# Patient Record
Sex: Male | Born: 1960 | Race: White | Hispanic: No | Smoking: Never smoker
Health system: Southern US, Community
[De-identification: ages and names within clinical notes are randomized; demographics above are authoritative.]

## PROBLEM LIST (undated history)

## (undated) ENCOUNTER — Ambulatory Visit (HOSPITAL_COMMUNITY): Admission: EM | Discharge status: Absent without leave | Payer: Self-pay

## (undated) DIAGNOSIS — M1990 Unspecified osteoarthritis, unspecified site: Secondary | ICD-10-CM

## (undated) DIAGNOSIS — I10 Essential (primary) hypertension: Secondary | ICD-10-CM

## (undated) HISTORY — PX: FOOT FASCIOTOMY: SHX1659

## (undated) HISTORY — PX: SHOULDER ARTHROTOMY: SUR111

## (undated) HISTORY — PX: HEMORRHOID SURGERY: SHX153

## (undated) HISTORY — PX: ELBOW SURGERY: SHX618

## (undated) HISTORY — PX: NECK EXPLORATION: SHX2077

## (undated) HISTORY — DX: Unspecified osteoarthritis, unspecified site: M19.90

---

## 2000-09-13 ENCOUNTER — Encounter: Payer: Self-pay | Admitting: Family Medicine

## 2000-09-13 ENCOUNTER — Ambulatory Visit (HOSPITAL_COMMUNITY): Admission: RE | Admit: 2000-09-13 | Discharge: 2000-09-13 | Payer: Self-pay | Admitting: Family Medicine

## 2000-11-04 ENCOUNTER — Ambulatory Visit (HOSPITAL_COMMUNITY): Admission: RE | Admit: 2000-11-04 | Discharge: 2000-11-04 | Payer: Self-pay | Admitting: General Surgery

## 2000-12-26 ENCOUNTER — Ambulatory Visit (HOSPITAL_COMMUNITY): Admission: RE | Admit: 2000-12-26 | Discharge: 2000-12-26 | Payer: Self-pay | Admitting: *Deleted

## 2000-12-26 ENCOUNTER — Encounter: Payer: Self-pay | Admitting: *Deleted

## 2001-11-13 ENCOUNTER — Encounter: Payer: Self-pay | Admitting: *Deleted

## 2001-11-13 ENCOUNTER — Ambulatory Visit (HOSPITAL_COMMUNITY): Admission: RE | Admit: 2001-11-13 | Discharge: 2001-11-13 | Payer: Self-pay | Admitting: *Deleted

## 2002-10-08 ENCOUNTER — Ambulatory Visit (HOSPITAL_COMMUNITY): Admission: RE | Admit: 2002-10-08 | Discharge: 2002-10-08 | Payer: Self-pay | Admitting: Family Medicine

## 2002-10-08 ENCOUNTER — Encounter: Payer: Self-pay | Admitting: Family Medicine

## 2002-10-11 ENCOUNTER — Encounter: Payer: Self-pay | Admitting: Orthopedic Surgery

## 2004-04-14 ENCOUNTER — Emergency Department (HOSPITAL_COMMUNITY): Admission: EM | Admit: 2004-04-14 | Discharge: 2004-04-14 | Payer: Self-pay | Admitting: Emergency Medicine

## 2004-05-06 ENCOUNTER — Ambulatory Visit: Payer: Self-pay | Admitting: Orthopedic Surgery

## 2004-07-06 ENCOUNTER — Ambulatory Visit: Payer: Self-pay | Admitting: Orthopedic Surgery

## 2004-08-03 ENCOUNTER — Ambulatory Visit: Payer: Self-pay | Admitting: Orthopedic Surgery

## 2004-09-07 ENCOUNTER — Ambulatory Visit: Payer: Self-pay | Admitting: Orthopedic Surgery

## 2004-10-14 ENCOUNTER — Ambulatory Visit: Payer: Self-pay | Admitting: Orthopedic Surgery

## 2004-10-22 ENCOUNTER — Ambulatory Visit: Payer: Self-pay | Admitting: Orthopedic Surgery

## 2004-12-07 ENCOUNTER — Ambulatory Visit: Payer: Self-pay | Admitting: Orthopedic Surgery

## 2004-12-15 ENCOUNTER — Ambulatory Visit (HOSPITAL_COMMUNITY): Admission: RE | Admit: 2004-12-15 | Discharge: 2004-12-15 | Payer: Self-pay | Admitting: Orthopedic Surgery

## 2004-12-15 ENCOUNTER — Ambulatory Visit: Payer: Self-pay | Admitting: Orthopedic Surgery

## 2004-12-17 ENCOUNTER — Ambulatory Visit: Payer: Self-pay | Admitting: Orthopedic Surgery

## 2004-12-24 ENCOUNTER — Ambulatory Visit: Payer: Self-pay | Admitting: Orthopedic Surgery

## 2005-01-21 ENCOUNTER — Ambulatory Visit: Payer: Self-pay | Admitting: Orthopedic Surgery

## 2005-03-17 ENCOUNTER — Ambulatory Visit: Payer: Self-pay | Admitting: Orthopedic Surgery

## 2005-06-16 ENCOUNTER — Ambulatory Visit (HOSPITAL_COMMUNITY): Admission: RE | Admit: 2005-06-16 | Discharge: 2005-06-16 | Payer: Self-pay | Admitting: Podiatry

## 2005-06-16 ENCOUNTER — Encounter (INDEPENDENT_AMBULATORY_CARE_PROVIDER_SITE_OTHER): Payer: Self-pay | Admitting: *Deleted

## 2005-07-02 ENCOUNTER — Encounter (INDEPENDENT_AMBULATORY_CARE_PROVIDER_SITE_OTHER): Payer: Self-pay | Admitting: Specialist

## 2005-07-02 ENCOUNTER — Ambulatory Visit (HOSPITAL_COMMUNITY): Admission: RE | Admit: 2005-07-02 | Discharge: 2005-07-02 | Payer: Self-pay | Admitting: General Surgery

## 2005-11-06 ENCOUNTER — Emergency Department (HOSPITAL_COMMUNITY): Admission: EM | Admit: 2005-11-06 | Discharge: 2005-11-06 | Payer: Self-pay | Admitting: Emergency Medicine

## 2005-11-17 ENCOUNTER — Ambulatory Visit (HOSPITAL_COMMUNITY): Admission: RE | Admit: 2005-11-17 | Discharge: 2005-11-17 | Payer: Self-pay | Admitting: Internal Medicine

## 2005-11-17 ENCOUNTER — Encounter: Payer: Self-pay | Admitting: Orthopedic Surgery

## 2005-11-18 ENCOUNTER — Ambulatory Visit (HOSPITAL_COMMUNITY): Admission: RE | Admit: 2005-11-18 | Discharge: 2005-11-18 | Payer: Self-pay | Admitting: Internal Medicine

## 2005-11-25 ENCOUNTER — Ambulatory Visit: Payer: Self-pay | Admitting: Orthopedic Surgery

## 2005-11-26 ENCOUNTER — Encounter (HOSPITAL_COMMUNITY): Admission: RE | Admit: 2005-11-26 | Discharge: 2005-12-26 | Payer: Self-pay | Admitting: Orthopedic Surgery

## 2005-12-21 ENCOUNTER — Ambulatory Visit (HOSPITAL_COMMUNITY): Admission: RE | Admit: 2005-12-21 | Discharge: 2005-12-21 | Payer: Self-pay | Admitting: Family Medicine

## 2005-12-27 ENCOUNTER — Ambulatory Visit: Payer: Self-pay | Admitting: Orthopedic Surgery

## 2005-12-28 ENCOUNTER — Encounter (HOSPITAL_COMMUNITY): Admission: RE | Admit: 2005-12-28 | Discharge: 2006-01-10 | Payer: Self-pay | Admitting: Orthopedic Surgery

## 2005-12-30 ENCOUNTER — Ambulatory Visit (HOSPITAL_COMMUNITY): Admission: RE | Admit: 2005-12-30 | Discharge: 2005-12-30 | Payer: Self-pay | Admitting: Internal Medicine

## 2006-01-13 ENCOUNTER — Ambulatory Visit: Payer: Self-pay | Admitting: Orthopedic Surgery

## 2006-02-14 ENCOUNTER — Ambulatory Visit: Payer: Self-pay | Admitting: Orthopedic Surgery

## 2006-02-22 ENCOUNTER — Inpatient Hospital Stay (HOSPITAL_COMMUNITY): Admission: RE | Admit: 2006-02-22 | Discharge: 2006-02-25 | Payer: Self-pay | Admitting: Neurosurgery

## 2006-03-16 ENCOUNTER — Ambulatory Visit: Payer: Self-pay | Admitting: Orthopedic Surgery

## 2006-07-14 ENCOUNTER — Encounter (INDEPENDENT_AMBULATORY_CARE_PROVIDER_SITE_OTHER): Payer: Self-pay | Admitting: Urology

## 2006-07-14 ENCOUNTER — Other Ambulatory Visit: Admission: RE | Admit: 2006-07-14 | Discharge: 2006-07-14 | Payer: Self-pay | Admitting: Urology

## 2007-01-16 ENCOUNTER — Ambulatory Visit (HOSPITAL_COMMUNITY): Admission: RE | Admit: 2007-01-16 | Discharge: 2007-01-16 | Payer: Self-pay | Admitting: Internal Medicine

## 2007-01-18 ENCOUNTER — Ambulatory Visit (HOSPITAL_COMMUNITY): Admission: RE | Admit: 2007-01-18 | Discharge: 2007-01-18 | Payer: Self-pay | Admitting: Podiatry

## 2007-01-24 ENCOUNTER — Ambulatory Visit (HOSPITAL_COMMUNITY): Admission: RE | Admit: 2007-01-24 | Discharge: 2007-01-24 | Payer: Self-pay | Admitting: Internal Medicine

## 2007-04-06 ENCOUNTER — Encounter (HOSPITAL_COMMUNITY): Admission: RE | Admit: 2007-04-06 | Discharge: 2007-05-06 | Payer: Self-pay | Admitting: Podiatry

## 2007-05-03 ENCOUNTER — Ambulatory Visit (HOSPITAL_COMMUNITY): Admission: RE | Admit: 2007-05-03 | Discharge: 2007-05-03 | Payer: Self-pay | Admitting: Podiatry

## 2007-05-25 ENCOUNTER — Ambulatory Visit: Payer: Self-pay | Admitting: Orthopedic Surgery

## 2007-05-25 DIAGNOSIS — M25529 Pain in unspecified elbow: Secondary | ICD-10-CM | POA: Insufficient documentation

## 2007-07-31 ENCOUNTER — Ambulatory Visit: Payer: Self-pay | Admitting: Orthopedic Surgery

## 2007-07-31 DIAGNOSIS — M76899 Other specified enthesopathies of unspecified lower limb, excluding foot: Secondary | ICD-10-CM | POA: Insufficient documentation

## 2007-08-05 ENCOUNTER — Encounter: Payer: Self-pay | Admitting: Orthopedic Surgery

## 2007-08-05 ENCOUNTER — Encounter: Admission: RE | Admit: 2007-08-05 | Discharge: 2007-08-05 | Payer: Self-pay | Admitting: Neurosurgery

## 2007-10-24 ENCOUNTER — Ambulatory Visit: Payer: Self-pay | Admitting: Orthopedic Surgery

## 2007-10-24 DIAGNOSIS — G5620 Lesion of ulnar nerve, unspecified upper limb: Secondary | ICD-10-CM | POA: Insufficient documentation

## 2007-10-24 DIAGNOSIS — M25519 Pain in unspecified shoulder: Secondary | ICD-10-CM | POA: Insufficient documentation

## 2007-11-06 ENCOUNTER — Telehealth: Payer: Self-pay | Admitting: Orthopedic Surgery

## 2007-11-07 ENCOUNTER — Encounter (HOSPITAL_COMMUNITY): Admission: RE | Admit: 2007-11-07 | Discharge: 2007-12-07 | Payer: Self-pay | Admitting: Orthopedic Surgery

## 2007-11-07 ENCOUNTER — Encounter: Payer: Self-pay | Admitting: Orthopedic Surgery

## 2007-11-10 ENCOUNTER — Encounter: Payer: Self-pay | Admitting: Orthopedic Surgery

## 2007-12-11 ENCOUNTER — Ambulatory Visit: Payer: Self-pay | Admitting: Orthopedic Surgery

## 2007-12-12 ENCOUNTER — Encounter (HOSPITAL_COMMUNITY): Admission: RE | Admit: 2007-12-12 | Discharge: 2008-01-10 | Payer: Self-pay | Admitting: Orthopedic Surgery

## 2008-01-09 ENCOUNTER — Ambulatory Visit (HOSPITAL_COMMUNITY): Admission: RE | Admit: 2008-01-09 | Discharge: 2008-01-09 | Payer: Self-pay | Admitting: Family Medicine

## 2008-01-24 ENCOUNTER — Encounter: Payer: Self-pay | Admitting: Orthopedic Surgery

## 2008-02-24 ENCOUNTER — Emergency Department (HOSPITAL_COMMUNITY): Admission: EM | Admit: 2008-02-24 | Discharge: 2008-02-24 | Payer: Self-pay | Admitting: Emergency Medicine

## 2008-02-29 ENCOUNTER — Telehealth: Payer: Self-pay | Admitting: Orthopedic Surgery

## 2008-04-02 ENCOUNTER — Inpatient Hospital Stay (HOSPITAL_COMMUNITY): Admission: RE | Admit: 2008-04-02 | Discharge: 2008-04-04 | Payer: Self-pay | Admitting: Neurosurgery

## 2008-04-02 ENCOUNTER — Encounter: Payer: Self-pay | Admitting: Orthopedic Surgery

## 2008-05-10 ENCOUNTER — Encounter: Payer: Self-pay | Admitting: Orthopedic Surgery

## 2009-10-21 ENCOUNTER — Telehealth: Payer: Self-pay | Admitting: Orthopedic Surgery

## 2009-10-22 ENCOUNTER — Ambulatory Visit: Payer: Self-pay | Admitting: Orthopedic Surgery

## 2009-10-22 DIAGNOSIS — S335XXA Sprain of ligaments of lumbar spine, initial encounter: Secondary | ICD-10-CM | POA: Insufficient documentation

## 2009-10-24 ENCOUNTER — Encounter: Payer: Self-pay | Admitting: Orthopedic Surgery

## 2010-01-31 ENCOUNTER — Encounter: Payer: Self-pay | Admitting: Orthopedic Surgery

## 2010-02-10 NOTE — Op Note (Signed)
Summary: L2-3 posterior fusion  L2-3 posterior fusion   Imported By: Jacklynn Ganong 10/24/2009 08:24:38  _____________________________________________________________________  External Attachment:    Type:   Image     Comment:   External Document

## 2010-02-10 NOTE — Assessment & Plan Note (Signed)
Summary: new problem/?hip/low back pain needs xr/mcr/bsf   Visit Type:  new problem  CC:  low back pain.  History of Present Illness: I saw Duane [name] in the office today for  visit.  He is a 50 years old man with the complaint of:  low back pain for several months.  Duane [name]. in his lower back on the LEFT side when he was doing some stretches while seated and has had continued pain in that area since.  He has taken some ibuprofen intermittently 600 mg although is not taking it now.  Is not complaining of any recent leg pain or neurologic symptoms although once he did have pain which radiated down to his LEFT knee.  He currently gets massages twice a month which seem to help    Allergies: 1)  ! Pcn 2)  ! Levaquin 3)  ! Codeine  Past History:  Past Surgical History: left elbow 2006 3 surg on left foot one surgery on neck 1970 Right Shoulder 1974 right shoulder 1999 left shoulder MUMFORD  2002 left shoulder RCR  2003 left shoulder RCR  NECK SURGERY: 2008 Multi level surgical fusion, Dr. 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  Review of Systems Constitutional:  Denies weight loss, weight gain, fever, chills, and fatigue. Cardiovascular:  Denies chest pain, palpitations, fainting, and murmurs. Respiratory:  Denies short of breath, wheezing, couch, tightness, pain on inspiration, and snoring . Gastrointestinal:  Denies heartburn, nausea, vomiting, diarrhea, constipation, and blood in your stools. Genitourinary:  Denies frequency, urgency, difficulty urinating, painful urination, flank pain, and bleeding in urine. Neurologic:  Denies numbness, tingling, unsteady gait, dizziness, tremors, and seizure. Musculoskeletal:  Complains of joint pain and muscle pain; denies swelling, instability, stiffness, redness, and heat. Endocrine:  Denies excessive thirst, exessive  urination, and heat or cold intolerance. Psychiatric:  Denies nervousness, depression, anxiety, and hallucinations. Skin:  Denies changes in the skin, poor healing, rash, itching, and redness. HEENT:  Denies blurred or double vision, eye pain, redness, and watering. Immunology:  Denies seasonal allergies, sinus problems, and allergic to bee stings. Hemoatologic:  Denies easy bleeding and brusing.  Physical Exam  Additional Exam:  This is a well-developed well-nourished male who has a congenitally abnormal RIGHT upper extremity which he's functioned well with over the years.  He has normal perfusion in both feet but normal dorsalis pedis pulses.  Skin is normal in both lower extremities  He has negative straight leg raises bilaterally normal equal reflexes bilaterally.  Soft touch is normal position such as normal soft touch is normal pinprick is normal.  He is awake alert and oriented x3 his mood and affect is normal  He ambulates normally  He stands normally  He has no tenderness in the midline of his spine but he does have tenderness just superior to the iliac crest on the LEFT side where he felt a pop.  It does not seem to affect his range of motion.  He has no increase in muscle tone.  Stability has not been an issue on flexion-extension is no catching locking or popping  His hip joints are normal in terms of flexion internal and external rotation with no pain.  Muscle tone in his legs are normal   Impression & Recommendations:  Problem # 1:  LUMBAR SPRAIN AND STRAIN (ICD-847.2) Assessment New  AP lateral and spot film of the  lumbosacral spine show that he does have an old L1 or acute compression injury, there is some suggestion of facet joint arthritis at L5-S1 and a slightly abnormal lumbar sagittal curvature  But overall his x-ray looks essentially nonacute  Orders: Est. Patient Level III (16109) Lumbosacral Spine ,2/3 views (72100)  Patient Instructions: 1)  Resume  IBUPROFEN  2)  Stretching for the back  3)  Massage therapy continue  4)  Thermacare heat wrap + / - heat pad  5)  return as needed

## 2010-02-10 NOTE — Progress Notes (Signed)
Summary: call to patient about appointment for back pain  Phone Note Outgoing Call   Call placed to: Patient Summary of Call: Called patient, as  he had scheduled an appt for new problem, low back pain, and it has been noted that he has had back surgery in the past year; therefore, he will need to follow up with back surgeon.  I left a message on answer machine re: needing to cancel the appointment w/Dr Romeo Apple. Initial call taken by: Cammie Sickle,  October 21, 2009 9:51 AM  Follow-up for Phone Call        Patient returned call 10:36AM this morning. Per phone w/ patient, he relates the problem is more in the left hip.  Spoke w/ nurse and w/Dr Romeo Apple, keep today's appointment for eval. Follow-up by: Cammie Sickle,  October 22, 2009 11:23 AM     Appended Document: call to patient about appointment for back pain patient had neck surgery although operative notes his lumbar spine  Patient will be seen

## 2010-02-10 NOTE — Letter (Signed)
Summary: History form,  History form,   Imported By: Jacklynn Ganong 10/24/2009 08:21:36  _____________________________________________________________________  External Attachment:    Type:   Image     Comment:   External Document

## 2010-04-23 LAB — DIFFERENTIAL
Eosinophils Relative: 1 % (ref 0–5)
Lymphocytes Relative: 25 % (ref 12–46)
Lymphs Abs: 1.6 10*3/uL (ref 0.7–4.0)

## 2010-04-23 LAB — COMPREHENSIVE METABOLIC PANEL
AST: 33 U/L (ref 0–37)
CO2: 22 mEq/L (ref 19–32)
Calcium: 9.5 mg/dL (ref 8.4–10.5)
Creatinine, Ser: 1.18 mg/dL (ref 0.4–1.5)
GFR calc Af Amer: >60 mL/min (ref >60)
GFR calc non Af Amer: >60 mL/min (ref >60)
Total Protein: 7.2 g/dL (ref 6.0–8.3)

## 2010-04-23 LAB — URINALYSIS, ROUTINE W REFLEX MICROSCOPIC
Bilirubin Urine: NEGATIVE
Hgb urine dipstick: NEGATIVE
Specific Gravity, Urine: 1.021 (ref 1.005–1.030)
Urobilinogen, UA: 0.2 mg/dL (ref 0.0–1.0)

## 2010-04-23 LAB — CBC
MCHC: 35.9 g/dL (ref 30.0–36.0)
MCV: 87.6 fL (ref 78.0–100.0)
Platelets: 289 10*3/uL (ref 150–400)
RBC: 4.97 MIL/uL (ref 4.22–5.81)
RDW: 13.5 % (ref 11.5–15.5)

## 2010-04-23 LAB — PROTIME-INR
INR: 1 (ref 0.00–1.49)
Prothrombin Time: 13 seconds (ref 11.6–15.2)

## 2010-04-23 LAB — GLUCOSE, CAPILLARY: Glucose-Capillary: 105 mg/dL — ABNORMAL HIGH (ref 70–99)

## 2010-04-28 LAB — CBC
HCT: 45.3 % (ref 39.0–52.0)
Hemoglobin: 15.8 g/dL (ref 13.0–17.0)
RBC: 5.19 MIL/uL (ref 4.22–5.81)

## 2010-04-28 LAB — URINE CULTURE: Culture: NO GROWTH

## 2010-04-28 LAB — DIFFERENTIAL
Basophils Absolute: 0 10*3/uL (ref 0.0–0.1)
Basophils Relative: 1 % (ref 0–1)
Eosinophils Absolute: 0 10*3/uL (ref 0.0–0.7)
Monocytes Relative: 7 % (ref 3–12)
Neutro Abs: 4.9 10*3/uL (ref 1.7–7.7)
Neutrophils Relative %: 58 % (ref 43–77)

## 2010-04-28 LAB — COMPREHENSIVE METABOLIC PANEL
ALT: 30 U/L (ref 0–53)
Alkaline Phosphatase: 72 U/L (ref 39–117)
BUN: 15 mg/dL (ref 6–23)
CO2: 24 mEq/L (ref 19–32)
Chloride: 106 mEq/L (ref 96–112)
GFR calc non Af Amer: >60 mL/min (ref >60)
Glucose, Bld: 93 mg/dL (ref 70–99)
Potassium: 3.9 mEq/L (ref 3.5–5.1)
Sodium: 137 mEq/L (ref 135–145)
Total Bilirubin: 1 mg/dL (ref 0.3–1.2)

## 2010-04-28 LAB — URINALYSIS, ROUTINE W REFLEX MICROSCOPIC
Glucose, UA: NEGATIVE mg/dL
Hgb urine dipstick: NEGATIVE
Ketones, ur: NEGATIVE mg/dL
Protein, ur: NEGATIVE mg/dL
Urobilinogen, UA: 0.2 mg/dL (ref 0.0–1.0)

## 2010-04-28 LAB — PROTIME-INR: Prothrombin Time: 12.6 seconds (ref 11.6–15.2)

## 2010-04-28 LAB — LIPASE, BLOOD: Lipase: 35 U/L (ref 11–59)

## 2010-05-22 ENCOUNTER — Emergency Department (HOSPITAL_COMMUNITY)
Admission: EM | Admit: 2010-05-22 | Discharge: 2010-05-22 | Disposition: A | Discharge status: Home / Self Care | Payer: Medicare Other | Attending: Emergency Medicine | Admitting: Emergency Medicine

## 2010-05-22 DIAGNOSIS — I10 Essential (primary) hypertension: Secondary | ICD-10-CM | POA: Insufficient documentation

## 2010-05-22 DIAGNOSIS — Z79899 Other long term (current) drug therapy: Secondary | ICD-10-CM | POA: Insufficient documentation

## 2010-05-22 DIAGNOSIS — M79609 Pain in unspecified limb: Secondary | ICD-10-CM | POA: Insufficient documentation

## 2010-05-22 DIAGNOSIS — G4700 Insomnia, unspecified: Secondary | ICD-10-CM | POA: Insufficient documentation

## 2010-05-22 DIAGNOSIS — M7989 Other specified soft tissue disorders: Secondary | ICD-10-CM | POA: Insufficient documentation

## 2010-05-22 DIAGNOSIS — M6250 Muscle wasting and atrophy, not elsewhere classified, unspecified site: Secondary | ICD-10-CM | POA: Insufficient documentation

## 2010-05-26 NOTE — H&P (Signed)
NAMEKELLAR, WESTBERG NO.:  [id number]   MEDICAL RECORD NO.:  [id number]          PATIENT TYPE:  INP   LOCATION:  3009                         FACILITY:  [facility]   PHYSICIAN:  Payton Doughty, M.D.      DATE OF BIRTH:  1960-11-30   DATE OF ADMISSION:  04/02/2008  DATE OF DISCHARGE:                              HISTORY & PHYSICAL   ADMITTING DIAGNOSIS:  Cervical spondylosis at C2-3.   BODY OF TEXT:  This is a 50 year old left-handed white gentleman who in  February 2008 underwent anterior decompression fusion at C3-4, C4-5, and  C5-6.  He had done well with that.  He developed some left shoulder and  arm pain and underwent an injection of the facets at C2-3 based on an  MR.  This helped transiently, but not on a sustained basis suggesting  that the pathology is at C2-3 and he is now admitted for a C2-3  posterior cervical fusion.   MEDICAL HISTORY:  Remarkable for neonatal brachial plexus injury, right  arm, and he also has hypertension.   SURGICAL HISTORY:  Left elbow and shoulder operation by Dr. Mila Palmer and  Dr. Romeo Apple.   ALLERGIES:  He is sensitive to PENICILLIN AND LEVAQUIN.   CURRENT MEDICATIONS:  He takes Meperitab, enalapril, diclofenac and  Compazine.   FAMILY HISTORY:  Mother died at 51.  Dad is in 60, in good health.  Mother had diabetes.   SOCIAL HISTORY:  Does not smoke, does not drink.  He is on disability  from being a Midwife.   REVIEW OF SYSTEMS:  Remarkable for hypertension, leg pain while he is  walking, difficulty with urination, arm weakness, leg weakness, back  pain, arm pain, leg pain, joint pain, and neck pain.   PHYSICAL EXAMINATION:  HEENT:  Within normal limits.  NECK:  He has good range of motion in his neck.  CHEST:  Clear.  CARDIOVASCULAR:  Regular rate and rhythm.  ABDOMEN:  Nontender.  No hepatosplenomegaly.  EXTREMITIES:  Without clubbing or cyanosis.  Peripheral pulses are good.  GU:  Deferred.  NEUROLOGIC:  Right  upper extremity has a little bit of deltoid function  and a little of biceps function.  Hand is nonfunctional.  Left upper  extremity has full strength, a lot of pain down into his left shoulder.  Lower extremity strength is full.  Reflexes are intact.   The diagnostic studies have been reviewed above.   CLINICAL IMPRESSION:  Cervical spondylosis at C2-3.   PLAN:  Translaminar screws at C2, lateral mass screws at C3, and BMP.  The risks and benefits have been discussed with him and he wishes to  proceed.           ______________________________  Payton Doughty, M.D.     MWR/MEDQ  D:  04/02/2008  T:  04/02/2008  Job:  [id number]

## 2010-05-26 NOTE — Op Note (Signed)
NAMEEMMAUEL, HALLUMS                 ACCOUNT NO.:  [id number]   MEDICAL RECORD NO.:  [id number]          PATIENT TYPE:  AMB   LOCATION:  DAY                           FACILITY:  [facility]   PHYSICIAN:  Denny Peon. Ulice Brilliant, D.P.M.  DATE OF BIRTH:  Jan 19, 1960   DATE OF PROCEDURE:  05/03/2007  DATE OF DISCHARGE:                               OPERATIVE REPORT   PREOPERATIVE DIAGNOSIS:  Non-improving chronic plantar fasciitis, heel  spur syndrome, left foot.   POSTOPERATIVE DIAGNOSIS:  Non-improving chronic plantar fasciitis, heel  spur syndrome, left foot.   PROCEDURE PERFORMED:  Endoscopic plantar fasciotomy, left foot.   SURGEON:  Denny Peon. Ulice Brilliant, DPM.   ANESTHESIA:  MAC.   INDICATIONS FOR SURGERY:  Long-standing history of plantar, medial, and  central heel pain of left foot present for approximately 1 year.  This  was treated previously at [facility] with an operative  procedure, Coblation therapy, which did not alleviate his pain.  He  still describes classic plantar fasciitis, heel spur complaints of this  left foot, i.e., pain upon arising after sitting, after activity, and in  the morning upon getting out of bed.  He is tender from the medial to  central aspect, and to a lesser extent, the lateral aspect of his left  foot.   The discussion has been held with the patient, and the next step would  be an endoscopic plantar fasciotomy.   DESCRIPTION OF PROCEDURES:  Mr. Finigan was brought into the OR and placed  on the table in the supine position.  IV sedation was established.  A  posterior tibial nerve block was performed about the medial ankle.  Further local anesthesia was administered about the medial and lateral  aspect of the left heel.  The anesthesia utilized was a 1:1 mixture of  lidocaine and Marcaine.  A pneumatic ankle tourniquet was then applied  across his left ankle.  His foot was prepped and draped in the usual  aseptic fashion.  An Ace bandage was utilized to  exsanguinate his foot.  The tourniquet was inflated to 250 mmHg.   PROCEDURE:  Endoscopic plantar fasciotomy of left foot.   Attention was directed to the medial aspect of the left heel.  The  medial calcaneal tubercle was appreciated.  At 1 cm distal to the  palpable portion of the medial calcaneal tubercle, a skin incision was  planned in a vertical fashion approximately 1 cm in length.  This  incision was made with a #15 blade, and this was then deepened to the  plantar fascial ligament utilizing a curved hemostat.  A Freer elevator  was then introduced to create a channel, just inferior to the plantar  fascia from medial to lateral.  This channel was then broadened  utilizing the spatula from the EPF instrumentation.  The obturator  cannula was then introduced and utilized to push through from the medial  aspect to the lateral aspect of the foot.  A separate incision was made  laterally.  The obturator was then removed, and the cannula was rotated  so that  it was slightly against the fascia.  The camera was inserted  medially.  The hook probe was inserted laterally.  Prior to the  procedure, a mark was made where he still had discomfort on the  plantar's lateral aspect of the foot.  A transection site was made here  through the fascia utilizing a triangle knife.  The transection was then  carried forth from lateral to medial, working across the plantar aspect  of his foot with a combination of the triangle and the hook knife.  The  camera was then relocated and inserted laterally, and the orientation  was reversed 180 degrees.  Any strands of plantar fascia that were not  transected were done at this point.  The cannula was then rotated 180  degrees so that it was slightly plantar.  The bevel to the scope was  then reversed, so that we can visualize this, and there was no fascia  remaining in this area.  The wound was flushed with copious amounts of  irrigant.  The transection was  deemed complete.  The wound was flushed  and then closed on each side utilizing a 4-0 Prolene and a combination  of simple interrupted horizontal mattress sutures.  A postoperative  injection of Hexadrol was dispensed.  A Betadine-soaked Adaptic dressing  was applied to create each incision and a dry sterile compressive  dressing followed.  The tourniquet was then deflated with tourniquet  time spanning 19 minutes.   Mr. Stankus tolerated the anesthesia and procedure well.  He was  transported to [facility].  While there, a list of written  instructions were explained to him.  He was dispensed a plantar fascial  night splint to wear at bedtime and a CAM walker to ambulate during the  day.  He will be seen in 6 days for first postop visit.           ______________________________  Denny Peon. Ulice Brilliant, D.P.M.     CMD/MEDQ  D:  05/03/2007  T:  05/04/2007  Job:  [id number]

## 2010-05-26 NOTE — H&P (Signed)
NAMELOLA, LOFARO                 ACCOUNT NO.:  [id number]   MEDICAL RECORD NO.:  [id number]          PATIENT TYPE:  AMB   LOCATION:  DAY                           FACILITY:  [facility]   PHYSICIAN:  Denny Peon. Ulice Brilliant, D.P.M.  DATE OF BIRTH:  05/24/1960   DATE OF ADMISSION:  DATE OF DISCHARGE:  LH                              HISTORY & PHYSICAL   DATE OF SCHEDULED SURGERY:  May 03, 2007.   PREOPERATIVE DIAGNOSIS:  Plantar fasciitis, heel spur symptoms of the  left foot.   HISTORY OF PRESENT ILLNESS:  Mr. Roussel has had left heel pain for well  over 1 year.  He previously underwent a surgical procedure of Coblation  therapy to his left heel about 4 months ago at [facility].  For  a period of time, he did well.  Following that, his heel pain recurred  and he has been treated further since this procedure with an injection  and going back on antiinflammatories.  Just relates basically classic  plantar fasciitis, heel spur symptoms when he gets up upon arising after  sitting.  It is very difficult to walk.  It also hurts after activity.   PAST MEDICAL HISTORY:  Significant for hypertension.  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   MEDICINES:  His medicines at this point are diclofenac and enalapril.   ALLERGIES:  He is allergic to [facility] and PENICILLIN.   OBJECTIVE:  Mr. Ferger is noted to be uncomfortable to deep palpation to  his left heel in the plantar medial and plantar central region of the  left heel.  He does have a calcaneal spur on x-ray.  Negative Tinel sign  at medial ankle.   ASSESSMENT:  Plantar fasciitis, heel spur symptoms of left foot,  unresponsive to Coblation therapy.   PLAN:  I have discussed options with Mr. Bjelland.  I think the most prudent  choice now would be an endoscopic plantar fasciotomy.  We discussed the  possibility of undergoing shock wave therapy; however, since he did not  do well with Coblation, I think there is a reasonable  chance shockwave  may also not work well with him.  We discussed going ahead and  surgically removing the heel spur; however, I do not think that is  necessary at this point.  We are going to go ahead and proceed with an  EPF.  I have discussed the procedure with Mr. Docken.  I have specifically  instructed him that there are some postoperative outcomes with an EPF  that are less than desirable, including but not limited to lateral  column pain, instances of neuritis following the procedure, medial arch  pain.  Mr. Kneisel has read his consent form and to my knowledge he has  apparently understood and signed.                                           ______________________________  Selena Batten  Jonita Albee, D.P.M.    CMD/MEDQ  D:  05/02/2007  T:  05/03/2007  Job:  [id number]

## 2010-05-26 NOTE — Op Note (Signed)
Duane, [name] NO.:  [id number]   MEDICAL RECORD NO.:  [id number]          PATIENT TYPE:  INP   LOCATION:  3009                         FACILITY:  [facility]   PHYSICIAN:  Payton Doughty, M.D.      DATE OF BIRTH:  1960/06/01   DATE OF PROCEDURE:  04/02/2008  DATE OF DISCHARGE:                               OPERATIVE REPORT   PREOPERATIVE DIAGNOSIS:  Spondylosis at L2-3.   POSTOPERATIVE DIAGNOSIS:  Spondylosis at L2-3.   OPERATIVE PROCEDURE:  L2-3 posterior fusion.   SURGEON:  Payton Doughty, MD   ANESTHESIA:  General endotracheal.   PREPARATION:  Prepped and draped with alcohol wipe.   COMPLICATIONS:  None.   NURSE ASSISTANT:  Bedelia Person, MD   DOCTOR ASSISTANT:  Hewitt Shorts, MD   BODY OF TEXT:  This is a 50 year old gentleman who is fused from C3 to  C6 and has disk and degenerative change at C2-3.  Taken to the operating  room, smoothly anesthetized, intubated, and placed on the Stryker bed  and was turned prone.  Pressure points were padded.  Eyes protected.  Following shave, prepped and draped in usual sterile fashion.  Skin was  incised from the top of L2 to the bottom of L3 and the lamina of L2 and  L3 were exposed bilaterally in subperiosteal plane.  Using standard  landmarks, translaminar screws were placed at C2.  Lateral mass screws  were placed in C3.  They were connected by 40-mm rods and locking caps  placed.  Final tightener was applied.  Intraoperative x-ray showed good  placement of screws and rods.  BMP on the Actifuse extender matrix was  placed on the facet joints and lamina after decortication with a high-  speed drill.  Successive layers of 0 Vicryl, 2-0 Vicryl, and 3-0 nylon  were used to close.  During the course of the operation when placing the  drill through the drill guide for the lateral mass and translaminar  screws, a piece of debris for the previous case was dislodged.  The  piece was isolated, passed off, and has been  kept by me.  The  instruments in question were sent off and resterilized.  The wound was  irrigated.  Any contact with the instrument on the drapes was recovered  with a towel.  The personnel in charge of cleaning the instruments and  the hospital chief operating officer, Mr. Maxcine Ham, was also notified  of this incident.  Dressing was applied and the patient returned to the  recovery room in good condition.           ______________________________  Payton Doughty, M.D.    MWR/MEDQ  D:  04/02/2008  T:  04/02/2008  Job:  [id number]

## 2010-05-26 NOTE — H&P (Signed)
Duane [name], Duane [name]                 ACCOUNT NO.:  [id number]   MEDICAL RECORD NO.:  [id number]          PATIENT TYPE:  AMB   LOCATION:  DAY                           FACILITY:  [facility]   PHYSICIAN:  Denny Peon. Ulice Brilliant, D.P.M.  DATE OF BIRTH:  06/29/1960   DATE OF ADMISSION:  DATE OF DISCHARGE:  LH                              HISTORY & PHYSICAL   HISTORY OF PRESENT ILLNESS:  Duane [name] has a painful left heel which has  been bothersome now for 8-9 months.  Duane [name] relates deep classic  aching pain in the left heel, unresponsive to conservative measures  which has been injection therapy, nonsteroidal antiinflammatory  medication, stretching exercises, change in shoe gear.  Duane [name] relates  that in spite of the injections and the anti-inflammatories, once the  effect of these have warn off, he continues to have difficulty,  specifically related if he sits and gets back up or upon arising after  sitting in the morning he can hardly walk.  Once he gets moving it eases  slightly, but it still constantly aches.   PAST MEDICAL HISTORY:  1. Hypertension.  2. History of arthritis.  3. Previous foot surgery done by myself for correction of a Taylor's      bunion.  4. Problems with his shoulder for which he has had surgery for.   MEDICATIONS:  Enalapril.   ALLERGIES:  PENICILLIN, LEVAQUIN.   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   OBJECTIVE:  Duane [name] reacts to deep palpation to the left heel in the  plantar medial region.  Tinel sign is negative.  Radiographs reveal mild  calcaneal spurring.   ASSESSMENT:  Non-improving plantar fasciitis/plantar fasciosis of left  foot.   PLAN:  Surgical correction has been discussed.  We have discussed  specifically different treatment options which include shockwave  therapy, Coblation therapy and endoscopic plantar fasciotomy.  We have  elected to perform  Coblation therapy through endoscopic means at [facility] on January 18, 2007.  Duane [name] and I have had a discussion  about the procedure, the possibility of complications, and the  likelihood of success in regards to this treatment option.  I have  described to him that this is not a quick fix that will take a few  weeks, 2-3 months to see the full effect of this.  Duane [name] relates that  he is fine with that.  We have got this scheduled for January 18, 2007,  at  [facility] under MAC.  This will involve use of tourniquet and  posterior tibial nerve block.  Likely, procedure time will be about 30  minutes.  To my knowledge, he has understood, he has read his consent  form and apparently understood and signed.  ______________________________  Denny Peon. Ulice Brilliant, D.P.M.     CMD/MEDQ  D:  01/17/2007  T:  01/18/2007  Job:  [id number]

## 2010-05-26 NOTE — Op Note (Signed)
Duane [name], KNOPE                 ACCOUNT NO.:  [id number]   MEDICAL RECORD NO.:  [id number]          PATIENT TYPE:  AMB   LOCATION:  DAY                           FACILITY:  [facility]   PHYSICIAN:  Denny Peon. Ulice Brilliant, D.P.M.  DATE OF BIRTH:  1960-04-01   DATE OF PROCEDURE:  01/18/2007  DATE OF DISCHARGE:                               OPERATIVE REPORT   PREOPERATIVE DIAGNOSIS:  Chronic unresponding plantar fasciitis, left  foot.   POSTOPERATIVE DIAGNOSIS:  Chronic unresponding plantar fasciitis, left  foot.   PROCEDURE PERFORMED:  Endoscopic microtenotomy, left foot, of plantar  fascia.   SURGEON:  Denny Peon. Ulice Brilliant, D.P.M.   ANESTHESIA:  Monitored anesthesia care.   ANESTHESIOLOGIST:  Johnney Killian, M.D.   INDICATIONS FOR SURGERY:  Greater than 38-month history of left plantar  heel pain, classic plantar fasciitis heel spur symptoms.  The patient  relates unrelenting pain, worse upon arising after sitting or upon  getting up in the morning.  Has been treated conservatively with  injection therapy and nonsteroidal anti-inflammatories, which have given  partial relief only.   PROCEDURE:  Endoscopic microtenotomy of plantar fascia, left foot.  Mr.  Toothman was seen in holding and the area of maximal tenderness is  identified by palpation on the undersurface of his left heel,  approximately 1 cm proximal to the medial calcaneal tubercle, directly  underlying the body of the calcaneus.  The area of maximal tenderness  seems to be in the medial two-thirds of this left heel.  This was  identified and marked with a skin marking pen.  Mr. Stickels was then  brought into the OR and placed on the table in the supine position.  IV  sedation is established.  A posterior tibial nerve block is performed  about his left ankle.  Further local anesthesia is administered about  the medial and lateral aspect of his left heel.  A pneumatic ankle  tourniquet is then applied across his left ankle.  His foot is  then  prepped and draped in the usual aseptic fashion.  An Ace bandage is  utilized to exsanguinate this foot.  The tourniquet is inflated to 250  mmHg.   Procedure:  Endoscopic micro tenotomy of plantar fascia, left foot.  Attention was directed to the medial aspect of the left heel.  The area  of maximal tenderness as marked is identified.  A single vertical 1-cm  incision is made over the medial band of the fascia in this area.  The  incision is deepened to the fascia, utilizing a curved hemostat.  A  Freer elevator is then introduced and utilized to create a channel,  working from medial to lateral on the undersurface of his heel.  This  channel is then deepened and broadened utilizing the spatula from the  EPF instrumentation.  The nonmetallic obturator cannula is then  introduced and driven through this channel from medial to lateral.  A  separate incision is made laterally so that the obturator could be  passed through and the cannula is rotated then so its slot lays directly  against the plantar fascia.  The camera is inserted medially and the  hook probe laterally.  The area which was marked preoperatively as being  the boundary of the painful area is identified with the hook probe.  The  radiofrequency wand is then inserted laterally and beginning here and  working medially, several multiple drilllings are made from lateral to  medial.  The cannula is then rotated so that its slot lays at about a 30-  degree angle to the original position and then another row of drilllings  is were made with the radiofrequency wand, and then the cannula is  rotated so that its slot lay about 30 degrees on the other side of the  original position and several holes are made in this area.  The  orientation is then reversed with the camera inserted laterally and the  radiofrequency wand medially and further drillings are made on the  medial aspect of the heel with the above-noted rotation of the  slot. In  total, approximately 40-60 drillings are made in the fascia in this  area.  The cannula is then removed and the wound is flushed with copious  amounts of irrigant.  Incisions are closed with 4-0 Prolene in a  singular horizontal mattress suture.  Postoperative dressing of Adaptic  and then 4x4s and Kerlix with the tourniquet being deflated and then  removed and the dressings taken up over his lower ankle.  The patient is  then fitted with a size large Cam walker.   Mr. Piacentini tolerates the anesthesia and procedure well.  He is transported  to day hospital without incident.  While there, I have gone over his  postoperative instructions.  A prescription for Mepergan fortis is  dispensed to a codeine intolerance.  He will be seen in 6 days for his  first postop visit.           ______________________________  Denny Peon. Ulice Brilliant, D.P.M.     CMD/MEDQ  D:  01/18/2007  T:  01/18/2007  Job:  [id number]

## 2010-05-29 NOTE — Op Note (Signed)
[name], [name]                 ACCOUNT NO.:  [id number]   MEDICAL RECORD NO.:  [id number]          PATIENT TYPE:  INP   LOCATION:  2899                         FACILITY:  [facility]   PHYSICIAN:  Payton Doughty, M.D.      DATE OF BIRTH:  04/01/1960   DATE OF PROCEDURE:  02/22/2006  DATE OF DISCHARGE:                               OPERATIVE REPORT   PREOPERATIVE DIAGNOSIS:  Spondylosis and disk C3-4, C4-5, C5-6.   POSTOPERATIVE DIAGNOSIS:  Spondylosis and disk C3-4, C4-5, C5-6.   PROCEDURE:  C3-4, C4-5, C5-6 anterior cervical decompression and fusion  with reflex hybrid plate.   SURGEON:  Payton Doughty, M.D.   ANESTHESIA:  General endotracheal.   PREP:  Sterile Betadine prep and scrub with alcohol wipe.   COMPLICATIONS:  None.   NURSE ASSISTANT:  Covington.   DOCTOR ASSISTANT:  Botero.   BODY OF TEXT:  A 50 year old gentleman with severe cervical spondylosis  and left C4-C5 radiculopathies.  He was taken to operating room,  smoothly anesthetized, intubated, placed supine on the operating table.  Following shave, prep, drape in the usual sterile fashion.  Skin was  incised starting two fingerbreadths below the carotid tubercle extended  superiorly paralleling the sternocleidomastoid muscle and with about a  finger width below the mandibular line.  The platysma was identified,  elevated, divided and undermined.  Sternocleidomastoids identified and  medial dissection revealed the carotid artery retracted laterally to the  left, trachea and esophagus retracted laterally to the right, exposing  bones the anterior cervical spine.  Marker was placed.  Intraoperative x-  ray obtained to confirm correctness of level.  Having confirmed  correctness of level, the longus colli was taken down bilaterally and  Shadow Line self-retaining retractor placed.  Diskectomy was then  carried out at C3-4, C4-5 and C5-6 under gross observation the operating  microscope was then brought in.  We used  microdissection technique to  remove the remaining disk, decompress the neural foramina and divide the  posterior longitudinal ligament. At  3-4 and 4-5 there was significant  spondylosis much greater on the left than on the right.  At 05/06 there  was __________ spondylosis greater on the left than on the right.  Following complete decompression of nerve roots and exploration the  neural foramina.  7 mm bone grafts were fashioned from  patellar  allograft and tapped into place.  Three-level reflex hybrid plate was  then placed using a 12-mm screws two in C3, two in C4, two in C5, two in  C6.  Intraoperative x-ray showed good placement of bone graft plate and  screws.  Wound was irrigated.  Hemostasis assured.  A 7-mm  Jackson-Pratt drain was placed in the prevertebral space and exited via  separate incision.  Successive layers of 0 Vicryl and 4-0 Vicryl were  used to close.  Benzoin, Steri-Strips were placed, made occlusive with  Telfa and OpSite.  The patient placed in Aspen collar and returned to  recovery room in good condition.           ______________________________  Payton Doughty, M.D.     MWR/MEDQ  D:  02/22/2006  T:  02/22/2006  Job:  [id number]

## 2010-05-29 NOTE — Op Note (Signed)
[name], [name]                 ACCOUNT NO.:  [id number]   MEDICAL RECORD NO.:  [id number]          PATIENT TYPE:  AMB   LOCATION:  DAY                           FACILITY:  [facility]   PHYSICIAN:  Vickki Hearing, M.D.DATE OF BIRTH:  May 22, 1960   DATE OF PROCEDURE:  12/15/2004  DATE OF DISCHARGE:                                 OPERATIVE REPORT   HISTORY AND INDICATIONS:  A 50 year old male with refractory pain in his  left elbow secondary to tennis elbow who presented for tennis elbow release.   PREOPERATIVE DIAGNOSIS:  Tennis elbow, left upper extremity.   POSTOPERATIVE DIAGNOSIS:  Tennis elbow, left upper extremity.   PROCEDURE:  Left tennis elbow release.   SURGEON:  Dr. Romeo Apple.   ANESTHETIC:  General.   OPERATIVE FINDINGS:  Disease of the extensor carpi radialis brevis tendon  and also the extensor digitorum longus   PROCEDURE:  The patient identified as Duane [name]. Antibiotics were given.  Left elbow was marked as surgical site, countersigned by the surgeon, and  the patient was taken to the operating room after Ancef was given. He was  given general anesthetic. Left upper extremity was prepped and draped in  sterile fashion.   A straight incision was made over the lateral epicondyle. Subcutaneous  tissue was divided. The interval between the extensor digitorum longus and  the extensor carpi radialis longus was divided. Blunt dissection was carried  down to the ECRB where tendinous portions of the musculotendinous junction  were fibrotic. This was excised. The tendon was released from the bone and  then sewn back to the periosteum of the humerus. Wound was irrigated and  closed in layered fashion with 2-0 Monocryl and 3-0 Prolene. We also placed  three #2 Ethibond sutures to repair the tendon back to bone. Wound was  covered sterilely, injected with 30 cc of Marcaine with epinephrine, and a  sterile bandage was applied along with a sling. The patient was  extubated  and taken to recovery room in stable condition.      Vickki Hearing, M.D.  Electronically Signed     SEH/MEDQ  D:  12/15/2004  T:  12/15/2004  Job:  [id number]

## 2010-05-29 NOTE — H&P (Signed)
[name], [name]                 ACCOUNT NO.:  [id number]   MEDICAL RECORD NO.:  [id number]          PATIENT TYPE:  AMB   LOCATION:  DAY                           FACILITY:  [facility]   PHYSICIAN:  Denny Peon. Ulice Brilliant, D.P.M.  DATE OF BIRTH:  [birth year]   DATE OF ADMISSION:  06/16/2005  DATE OF DISCHARGE:  LH                                HISTORY & PHYSICAL   HISTORY OF PRESENT ILLNESS:  [name] has a long-standing problem with  recurring bouts of sesamoiditis on the plantar aspect of his left foot which  has been bothering him for probably eight years.  Some relief and then it  will get worse again.  Also complicated by recurring problems with gouty  arthritis.  Secondly, over the last eight to 10 months he has developed  further discomfort in the lateral aspect of the foot along the fifth  metatarsal phalangeal joint.  [name] relates that he thinks it is probably  due to him trying to take pressure off the plantar aspect of the big toe  joint compensating and putting more pressure over here and I am in agreement  with that.  Duane [name]'s past medical history is significant for hypertension.  He takes blood pressure medication which is prescribed by Dr. Sherwood [name] at  [facility].  He has had previous shoulder surgery, left  shoulder, right shoulder, and left elbow.  He is allergic to PENICILLIN and  LEVAQUIN.  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  Radiographs are taken and he  has an old fractured tibial sesamoid left.  He is noted to have a pretty  well defined Taylor's bunion on the left foot.   ASSESSMENT:  Painful long-standing sesamoid problem left foot and Taylor's  bunion left foot.   PLAN:  Surgical correction will consist of excision of the tibial sesamoid  of his  left foot under the first MTP and a fifth metatarsal osteotomy to  correct for Taylor's bunion.  I have described the procedures to Duane [name].  The risk factor with the sesamoid excision is, in theory, the development of  an HAV deformity.  He also could have some neuritis secondary to the  incision over the first MTP medially.  Regarding the Taylor's bunion he has  been made aware that this will be an osteotomy with fixation to correct for  the deformity and the possibility of delayed union, non-union, of course the  possibility also on both of infectious process.  [name] has participated  in the discussion of this.  We have discussed the procedures, the immediate  surgical course and postoperative course, the possibilities, the  complications.  He has read the above and the consent form, apparently  understood, and signed.  ______________________________  Denny Peon. Ulice Brilliant, D.P.M.     CMD/MEDQ  D:  06/15/2005  T:  06/15/2005  Job:  [id number]

## 2010-05-29 NOTE — Op Note (Signed)
NAMEADOLPHUS, Duane [name]                 ACCOUNT NO.:  [id number]   MEDICAL RECORD NO.:  [id number]          PATIENT TYPE:  AMB   LOCATION:  DAY                           FACILITY:  [facility]   PHYSICIAN:  Dalia Heading, M.D.  DATE OF BIRTH:  April 30, 1960   DATE OF PROCEDURE:  07/02/2005  DATE OF DISCHARGE:                                 OPERATIVE REPORT   PREOPERATIVE DIAGNOSIS:  Thrombosed hemorrhoid.   POSTOPERATIVE DIAGNOSIS:  Thrombosed hemorrhoid.   PROCEDURE:  Hemorrhoidectomy.   SURGEON:  Dalia Heading, M.D.   ANESTHESIA:  General.   INDICATIONS:  The patient is a 50 year old white male who presents with two  thrombosed hemorrhoids.  They appear at the 12 o'clock and 5 o'clock  positions.  The patient now comes the operating room for hemorrhoidectomy.  The risks and benefits of the procedure including bleeding, infection,  recurrence of the hemorrhoids, were fully explained to the patient, who gave  informed consent.   PROCEDURE NOTE:  The patient was placed in the lithotomy position after  general anesthesia was administered.  The perineum was prepped and draped  using the usual sterile technique with Betadine.  Surgical site confirmation  was performed.   Internal and external hemorrhoids were noted at the 12 o'clock and 5 o'clock  positions.  Both were excised without difficulty.  The mucosal edges were  reapproximated using 2-0 Vicryl running sutures.  Any bleeding was  controlled using Bovie electrocautery.  The sphincter tone was noted to be  good.  The perineum was injected with 0.5% Sensorcaine.  Surgicel and  viscous Xylocaine packing was then placed into the rectum.   All tape and needle counts were correct at the end of the procedure.  The  patient was awakened and transferred to PACU in stable condition.   COMPLICATIONS:  None.   SPECIMEN:  Hemorrhoids.   BLOOD LOSS:  Minimal.      Dalia Heading, M.D.  Electronically Signed     MAJ/MEDQ  D:   07/02/2005  T:  07/02/2005  Job:  [id number]   cc:   Madelin Rear. Sherwood Gambler, MD  Fax: [phone]

## 2010-05-29 NOTE — H&P (Signed)
Duane [name], Duane [name]                 ACCOUNT NO.:  [id number]   MEDICAL RECORD NO.:  [id number]          PATIENT TYPE:  AMB   LOCATION:  DAY                           FACILITY:  [facility]   PHYSICIAN:  Vickki Hearing, M.D.DATE OF BIRTH:  1960/05/04   DATE OF ADMISSION:  DATE OF DISCHARGE:  LH                                HISTORY & PHYSICAL   CHIEF COMPLAINT:  Left elbow pain.   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  He has a  negative review of systems.  He has been treated for his tennis elbow on the  left with several injections, tennis elbow brace, home exercises, ice, and  has persistent pain in the elbow lateral epicondyle.   EXAMINATION:  His exam shows a deformity of his right upper extremity,  surgical incisions as noted in the area stated, he has a weight of 158, a  pulse of 68 and a respiratory rate of 18.  He has otherwise normal  development, nutrition, grooming, hygiene.  Cardiovascular no swelling or  varicosities.  Pulses are palpable and intact.  Extremities are warm, no  edema.  No tenderness cervical spine.  Lymph nodes are normal.  Gait and  station is normal.  His lumbar spine is nontender.  His skin other than the  incisions are normal.  Neuropsych exam shows left upper extremity, normal  sensation, he is awake, alert, and oriented x3, no depression, anxiety,  agitation.  Coordination and reflexes are normal.   Regarding his left elbow he does exhibit some tenderness over his lateral  epicondyle which is quite significant.  He has a slight decrease in the  amount of extension, full flexion, tenderness over the radial head and  lateral  epicondyle, painful wrist extension with resistance.  Grip strength  is normal.   He has no elbow instability.   IMPRESSION:  Left tennis elbow.   PLAN:  Left tennis elbow release.      Vickki Hearing, M.D.  Electronically Signed    SEH/MEDQ  D:  12/14/2004  T:  12/14/2004  Job:  [id number]   cc:   [facility]  Fax: [phone]

## 2010-05-29 NOTE — H&P (Signed)
Duane [name], [place] NO.:  [id number]   MEDICAL RECORD NO.:  [id number]          PATIENT TYPE:  INP   LOCATION:  3018                         FACILITY:  [facility]   PHYSICIAN:  Payton Doughty, M.D.      DATE OF BIRTH:  24-Jan-1960   DATE OF ADMISSION:  02/22/2006  DATE OF DISCHARGE:                              HISTORY & PHYSICAL   ADMITTING DIAGNOSIS:  Cervical spondylosis   SERVICE:  Neurosurgery.   A 49 year old left-handed white gentleman had for some time pain in his  left elbow and wrist.  MR shows spondylosis 3-4, 4-5, and 5-6, and a  little bit less at 6-7.  He presents now today for an anterior  decompression and fusion 3-4, 4-5, and 5-6.   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   SURGICAL HISTORY:  Left elbow and left shoulder twice by Dr. Romeo Apple  and Dr. Mila Palmer, and Dr. Romeo Apple respectively.   ALLERGIES:  He is sensitive to PENICILLIN.   MEDICATIONS:  Meperitab, Enalapril, diclofenac, and Compazine.   FAMILY HISTORY:  Mom died at 3, father at 47 in good health.  Mother  had diabetes.   SOCIAL HISTORY:  Does not smoke, does not drink.  He is on disability  for being __________.   REVIEW OF SYSTEMS:  Remarkable for hypertension, leg pain while he is  walking, difficulty with urination.  No arm weakness, leg weakness, back  pain, arm pain, leg pain, joint pain, neck pain.  HEENT EXAM:  Within normal limits.  NECK:  He had somewhat limited range of motion of the neck.  CHEST:  Clear.  CARDIAC EXAM:  Regular rate and rhythm.  ABDOMEN:  Nontender with no hepatosplenomegaly.  EXTREMITIES:  Without clubbing or cyanosis.  Peripheral pulses are good.  GU EXAM:  Deferred.  NEUROLOGICALLY:  In his right upper extremity, he has a little bit of  deltoid and a little bit of biceps function.  Hand is fairly  nonfunctional.  Left upper extremity strength is full with a lot of pain  down his left  arm.  Lower extremity strength is intact.  There is no  evidence of myelopathy.   MR demonstrates, as noted above, spondylosis 3-4, 4-5, and 5-6, slightly  less so at 6-7.   CLINICAL IMPRESSION:  Left cervical radiculopathy related to  spondylosis.  Because of his deformed right arm, the difficulty seemed  to magnify in this gentleman who is very dependent on his left arm for  function.   PLAN:  For an anterior decompression and fusion at 3-4, 4-5, and 5-6.  The risks and benefits of this approach have been discussed with him,  and he wished to proceed.           ______________________________  Payton Doughty, M.D.     MWR/MEDQ  D:  02/22/2006  T:  02/22/2006  Job:  [id number]

## 2010-05-29 NOTE — Discharge Summary (Signed)
NAMEHANS, RUSHER                 ACCOUNT NO.:  [id number]   MEDICAL RECORD NO.:  [id number]          PATIENT TYPE:  INP   LOCATION:  3018                         FACILITY:  [facility]   PHYSICIAN:  Payton Doughty, M.D.      DATE OF BIRTH:  July 29, 1960   DATE OF ADMISSION:  02/22/2006  DATE OF DISCHARGE:  02/25/2006                               DISCHARGE SUMMARY   ADMITTING DIAGNOSIS:  Spondylosis C3-4, C4-5, C5-6.   DISCHARGE DIAGNOSIS:  Spondylosis C3-4, C4-5, C5-6.   OPERATIVE PROCEDURE:  C3-4, C4-5, C5-6 anterior cervical decompression  and fusion with a Reflex Hybrid plate.   SURGEON:  Payton Doughty, M.D.   SERVICE:  Neurosurgery.   COMPLICATIONS:  None.   DISCHARGE STATUS:  Well    A 50 year old gentleman whose history and physical is recounted in the  chart.  He had cervical spondylosis in the left C4 and C5  radiculopathies.  He was admitted after obtaining normal laboratory  values and underwent an anterior decompression and fusion.  He did well.  He had a drain in postoperatively.  The drain was removed after the  second postop day.  The third postop day he is awake, alert and  oriented.  Strength is full and he is able to be discharged home.  He is  giving Percocet for pain and he is to follow up with me in the [facility] office in about 2 weeks with an x-ray.           ______________________________  Payton Doughty, M.D.     MWR/MEDQ  D:  02/25/2006  T:  02/25/2006  Job:  [id number]

## 2010-05-29 NOTE — Op Note (Signed)
Duane [name], Duane [name]                 ACCOUNT NO.:  [id number]   MEDICAL RECORD NO.:  [id number]          PATIENT TYPE:  AMB   LOCATION:  DAY                           FACILITY:  [facility]   PHYSICIAN:  Denny Peon. Ulice Brilliant, D.P.M.  DATE OF BIRTH:  03/17/1960   DATE OF PROCEDURE:  06/16/2005  DATE OF DISCHARGE:                                 OPERATIVE REPORT   PREOPERATIVE DIAGNOSES:  1.  Symptomatic fractured tibial sesamoid, left foot.  2.  Tailor's bunion fifth metatarsal left foot.   POSTOPERATIVE DIAGNOSES:  1.  Symptomatic fractured tibial sesamoid, left foot.  2.  Tailor's bunion fifth metatarsal left foot.   PROCEDURES PERFORMED:  1.  Excision of fractured tibial sesamoid, left foot.  2.  Metatarsal osteotomy to correct for tailor's bunion, fifth metatarsal      left foot.   SURGEON:  Denny Peon. Ulice Brilliant, D.P.M.   ANESTHESIA:  MAC.   ANESTHESIOLOGIST:  Dr. Jayme Cloud.   INDICATIONS FOR SURGERY:  Patient has a painful longstanding problem under  the plantar medial aspect of the left great toe joint.  He has had a  fractured tibial sesamoid in this area, and he continues to have recurring  bouts of gouty arthritis in the area.  He also has a painful tailor's bunion  deformity of the left foot which has caused discomfort over the last year or  so.  He is noted, clinically and radiographically, to have an increase in  the intermetatarsal angle with a prominent tailor's bunion at the fifth  metatarsophalangeal joint.   DESCRIPTION OF PROCEDURE:  Tannor is brought in the OR and placed on the  table in the supine position.  IV sedation is established.  A Mayo block is  performed about both the first and fifth MTP of the left foot, utilizing a 1-  to-1 mixture of lidocaine and Marcaine.  Pneumatic ankle tourniquet is  placed across left ankle.  His foot is prepped and draped in the usual  aseptic fashion.  An Ace bandage if utilized to exsanguinate his foot.  Tourniquet is inflated to 250  mmHg.   EXCISION OF TIBIAL SESAMOID LEFT FOOT:  Attention is directed to the medial  aspect of the first MTP.  A 4 cm slightly curvilinear skin incision is made.  The incision is deepened by sharp and blunt dissection through subcutaneous  tissues.  At this point it is noted that anesthesia is not adequate.  He is  still having some discomfort so further local anesthetic of 0.5% Marcaine is  injected.  The dissection is carried through subcutaneous tissues to the  deep fascia.  A linear medial capsulotomy is performed.  The capsular  incision is made with a #15 blade and deepened by sharp dissection to bone.  Attention is taken to get good exposure of the medial aspect of the tibial  sesamoid.  This is both visualized intraoperatively as well as utilizing the  fluoroscopy unit.  With the tibial sesamoid visualized and isolated, it is  slowly and carefully dissected free from the wound.  During the course of  the dissection the fracture became evident and the sesamoid came out in two  parts, distal and proximal.  It is noted that, between the two fracture  fragments, there is what appears to be tophaceous deposits which further  confirms recurrence of gouty arthritis in the joint.  The tibial sesamoid is  excised completely.  The flexor tendon is inspected and found to be free of  laceration.  The wound is flushed with copious amounts of sterile irrigant.  Any redundant soft tissue is excised.  Capsular tissues are then  reapproximated and closed with 3-0 Vicryl.  Subcutaneous tissues are  reapproximated and closed with 4-0 Vicryl and skin is closed with 4-0 in  subcuticular suture.   FIFTH METATARSAL OSTEOTOMY TO CORRECT FOR TAILOR'S BUNION LEFT FOOT:  Attention is directed to the left fifth metatarsal.  A dorsolateral skin  incision is planned with a skin marking pen and then carried forth with a  #15 blade.  The incision is deepened by sharp and blunt dissection through  subcutaneous  tissues.  Care is taken to get good exposure of the MTP, the  metatarsal head and surgical neck.  A dorsal linear capsulotomy was then  performed.  The capsular and periosteal tissues are reflected away from the  lateral aspect of the fifth metatarsal head and the surgical neck of the  bone.  An oblique dorsal distal to plantar proximal osteotomy is then  performed in the bone utilizing first a #62 and then completed plantarly  with a #64 blade.  The capital fragment is relocated approximately 50%  across the width of the metatarsal surface.  This is then fixated via 2.0 x  12-mm, self-tapping screw.  The osteotomy is deemed stable.  The redundant  bone laterally is excised utilizing first a bone rongeur and then the  pneumatic rasp.  The wound is flushed with copious amounts of irrigant.  The  screw head lies closely against the bone with minimal prominence.  With the  osteotomy stable, the capsular tissues are reapproximated and closed first  with 4-0 Vicryl.  Subcutaneous tissues are then reapproximated and closed  over this in a horizontal mattress suture running of 4-0 Vicryl.  Skin is  closed with 4-0 in a subcuticular suture.   Steri-Strips were applied across both the first and the fifth incisions.  A  postoperative injection of Marcaine and Hexadrol is dispensed to each.  A  Betadine soaked Adaptic dressing is applied over each incision and a dry  sterile compressive dressing follows.  This is then secured with Coban and a  24-inch piece of stockinette is applied to the foot following deflation of  the tourniquet and removal of the draping.  The tourniquet time for the  procedure spanned 65 minutes.   Mr. Ozanich was transported from OR #[facility] to [facility] without incident.  Where  there a list of written instructions were orally explained to he and his  companion.  He is dispensed a prescription for Lorcet Plus and Phenergan. He will be seen in 7 days for first postop visit in the  [place] office.           ______________________________  Denny Peon. Ulice Brilliant, D.P.M.     CMD/MEDQ  D:  06/16/2005  T:  06/16/2005  Job:  [id number]

## 2010-05-29 NOTE — H&P (Signed)
NAMEELIAB, CLOSSON                 ACCOUNT NO.:  [id number]   MEDICAL RECORD NO.:  [id number]          PATIENT TYPE:  AMB   LOCATION:                                FACILITY:  [facility]   PHYSICIAN:  Dalia Heading, M.D.  DATE OF BIRTH:  03-Mar-1960   DATE OF ADMISSION:  07/02/2005  DATE OF DISCHARGE:  LH                                HISTORY & PHYSICAL   CHIEF COMPLAINT:  Thrombosed hemorrhoid.   HISTORY OF PRESENT ILLNESS:  The patient is a 50 year old white male who is  referred for evaluation and treatment of painful hemorrhoids.  Started  having pain and swelling last week after undergoing foot surgery.  He has  not had problems still recently.  He had become constipated.  He did well  after his hemorrhoidectomy in 2002.   PAST MEDICAL HISTORY:  Past medical history includes malformation of the  right arm   PAST SURGICAL HISTORY:  Hemorrhoidectomy in the remote past, shoulder  surgery, left foot surgery   CURRENT MEDICATIONS:  Enalapril for hypertension.   ALLERGIES:  PENICILLIN and CODEINE.   REVIEW OF SYSTEMS:  The patient denies drinking and smoking.   PHYSICAL EXAMINATION:  GENERAL:  The patient is a well-developed, well-  nourished white male in no acute distress.  LUNGS:  Clear to auscultation with equal breath sounds bilaterally.  HEART: Examination reveals regular rate and rhythm without history, S4,  murmurs.  RECTAL:  Examination reveals to large thrombosed hemorrhoids.  Examination  was limited secondary to pain.   IMPRESSION:  Thrombosed hemorrhoids.   PLAN:  The patient is scheduled for hemorrhoidectomy on July 02, 2005.  Risks and benefits of the procedure including bleeding, infection,  recurrence of the hemorrhoids were fully explained to the patient, gave  informed consent.      Dalia Heading, M.D.  Electronically Signed     MAJ/MEDQ  D:  06/29/2005  T:  06/29/2005  Job:  [id number]   cc:   [facility]  Fax: [phone]   Madelin Rear.  Sherwood Gambler, MD  Fax: [phone]

## 2010-05-29 NOTE — H&P (Signed)
[facility]  Patient:    EAN, GETTEL Visit Number: [id number] MRN: [id number]          Service Type: Attending:  Franky Macho, M.D. Dictated by:   Franky Macho, M.D. Adm. Date:  10/24/00   CC:         [facility]   History and Physical  DATE OF BIRTH: 11/15/1960  CHIEF COMPLAINT: Hemorrhoidal disease.  HISTORY OF PRESENT ILLNESS: The patient is a 50 year old white male, who is referred for evaluation and treatment of hemorrhoidal disease.  He has had intermittent rectal discomfort, pain, and hematochezia over the past year. Creams have only been mildly helpful.  No fever, chills, constipation, diarrhea, or melena have been noted.  PAST MEDICAL HISTORY: Malformation of right arm.  PAST SURGICAL HISTORY: Shoulder surgery.  CURRENT MEDICATIONS: Anusol cream.  ALLERGIES:  1. PENICILLIN.  2. CODEINE.  SOCIAL HISTORY: The patient denies drinking or smoking.  REVIEW OF SYSTEMS: Denies any other cardiopulmonary difficulties.  PHYSICAL EXAMINATION:  GENERAL: The patient is a well-developed, well-nourished white male in no acute distress.  VITAL SIGNS: He is afebrile and vital signs are stable.  LUNGS: Clear to auscultation with equal breath sounds bilaterally.  HEART: Regular rate and rhythm without S3, S4, or murmurs.  RECTAL: Irritated internal and external hemorrhoid noted along the right lateral aspect of the anus.  No other masses noted.  Stool Hemoccult negative.  IMPRESSION: Hemorrhoidal disease.  PLAN: The patient is scheduled for hemorrhoidectomy on October 24, 2000.  The risks and benefits of the procedure including bleeding, infection, and incontinence were fully explained to the patient, who gave informed consent. Dictated by:   Franky Macho, M.D. Attending:  Franky Macho, M.D. DD:  10/18/00 TD:  10/19/00 Job: [id number]

## 2010-05-29 NOTE — Op Note (Signed)
[facility]  Patient:    Duane [name], Duane [name] Visit Number: [id number] MRN: [id number]          Service Type: DSU Location: DAY Attending Physician:  Dalia Heading Dictated by:   Franky Macho, M.D. Proc. Date: 11/04/00 Admit Date:  11/04/2000   CC:         [facility]   Operative Report  PATIENT AGE:  50 years.  PREOPERATIVE DIAGNOSIS:  Hemorrhoidal disease.  POSTOPERATIVE DIAGNOSIS:  Hemorrhoidal disease.  OPERATION:  Extensive hemorrhoidectomy.  SURGEON:  Franky Macho, M.D.  ANESTHESIA:  General.  INDICATIONS:  The patient is a 50 year old white male with a longstanding history of problems with hemorrhoidal disease.  The risks and benefits of the procedure including bleeding, infection, and recurrence of hemorrhoidal disease were fully explained to the patient who gave informed consent.  DESCRIPTION OF PROCEDURE:  The patient was placed in the lithotomy position after general anesthesia was administered.  The perineum was prepped and draped using the usual sterile technique with Betadine.  The patient was noted to have internal and external hemorrhoidal disease at both the 4 oclock and 7 oclock positions.  Both internal and external hemorrhoids were excised in these areas.  The mucosal edges were reapproximated using 2-0 Vicryl running suture.  Additional hemorrhoidal disease was noted at the 2 oclock position, but this was not addressed at this time.  It seemed to be mild in nature.  Any bleeding was controlled using Bovie electrocautery; 0.5 Marcaine was instilled into the surrounding perineum.  The rectum was packed with Surgicel and viscous Xylocaine.  All tape and needle counts correct at the end of the procedure.  The patient was awakened and transferred to PACU in stable condition.  COMPLICATIONS:  None.  SPECIMEN:  Internal and external hemorrhoids.  ESTIMATED BLOOD LOSS:  Less than 50 cc. Dictated by:   Franky Macho, M.D. Attending Physician:  Dalia Heading DD:  11/04/00 TD:  11/06/00 Job: [id number]

## 2010-09-30 LAB — CBC
HCT: 41
MCV: 86.3
Platelets: 326
RDW: 12.6

## 2010-09-30 LAB — BASIC METABOLIC PANEL
BUN: 13
Creatinine, Ser: 1.14
GFR calc non Af Amer: >60
Glucose, Bld: 92
Potassium: 4

## 2010-10-06 LAB — BASIC METABOLIC PANEL
BUN: 11
Creatinine, Ser: 1.12
GFR calc Af Amer: >60
GFR calc non Af Amer: >60
Potassium: 4.1

## 2010-10-06 LAB — HEMOGLOBIN AND HEMATOCRIT, BLOOD: Hemoglobin: 14.6

## 2011-04-27 ENCOUNTER — Encounter (HOSPITAL_COMMUNITY): Payer: Self-pay | Admitting: Emergency Medicine

## 2011-04-27 ENCOUNTER — Emergency Department (HOSPITAL_COMMUNITY)
Admission: EM | Admit: 2011-04-27 | Discharge: 2011-04-27 | Disposition: A | Discharge status: Home / Self Care | Payer: Medicare Other | Attending: Emergency Medicine | Admitting: Emergency Medicine

## 2011-04-27 DIAGNOSIS — I10 Essential (primary) hypertension: Secondary | ICD-10-CM | POA: Insufficient documentation

## 2011-04-27 DIAGNOSIS — R1013 Epigastric pain: Secondary | ICD-10-CM | POA: Insufficient documentation

## 2011-04-27 DIAGNOSIS — K2900 Acute gastritis without bleeding: Secondary | ICD-10-CM | POA: Insufficient documentation

## 2011-04-27 DIAGNOSIS — Z862 Personal history of diseases of the blood and blood-forming organs and certain disorders involving the immune mechanism: Secondary | ICD-10-CM | POA: Insufficient documentation

## 2011-04-27 DIAGNOSIS — Z8639 Personal history of other endocrine, nutritional and metabolic disease: Secondary | ICD-10-CM | POA: Insufficient documentation

## 2011-04-27 HISTORY — DX: Essential (primary) hypertension: I10

## 2011-04-27 MED ORDER — GI COCKTAIL ~~LOC~~
30.0000 mL | Freq: Once | ORAL | Status: AC
  Administered 2011-04-27: 30 mL via ORAL
  Filled 2011-04-27: qty 30

## 2011-04-27 MED ORDER — ACETAMINOPHEN 500 MG PO TABS
ORAL_TABLET | ORAL | Status: AC
  Administered 2011-04-27: 1000 mg
  Filled 2011-04-27: qty 2

## 2011-04-27 MED ORDER — ACETAMINOPHEN 500 MG PO TABS: 1000.0000 mg | ORAL_TABLET | Freq: Once | ORAL | Status: DC

## 2011-04-27 MED ORDER — PANTOPRAZOLE SODIUM 20 MG PO TBEC: 40.0000 mg | DELAYED_RELEASE_TABLET | Freq: Every day | ORAL | Status: DC

## 2011-04-27 MED ORDER — PANTOPRAZOLE SODIUM 40 MG PO TBEC
40.0000 mg | DELAYED_RELEASE_TABLET | Freq: Once | ORAL | Status: AC
  Administered 2011-04-27: 40 mg via ORAL
  Filled 2011-04-27: qty 1

## 2011-04-27 NOTE — Discharge Instructions (Signed)
Gastritis Gastritis is an inflammation (the body's way of reacting to injury and/or infection) of the stomach. It is often caused by viral or bacterial (germ) infections. It can also be caused by chemicals (including alcohol) and medications. This illness may be associated with generalized malaise (feeling tired, not well), cramps, and fever. The illness may last 2 to 7 days. If symptoms of gastritis continue, gastroscopy (looking into the stomach with a telescope-like instrument), biopsy (taking tissue samples), and/or blood tests may be necessary to determine the cause. Antibiotics will not affect the illness unless there is a bacterial infection present. One common bacterial cause of gastritis is an organism known as H. Pylori. This can be treated with antibiotics. Other forms of gastritis are caused by too much acid in the stomach. They can be treated with medications such as H2 blockers and antacids. Home treatment is usually all that is needed. Young children will quickly become dehydrated (loss of body fluids) if vomiting and diarrhea are both present. Medications may be given to control nausea. Medications are usually not given for diarrhea unless especially bothersome. Some medications slow the removal of the virus from the gastrointestinal tract. This slows down the healing process. HOME CARE INSTRUCTIONS Home care instructions for nausea and vomiting:  For adults: drink small amounts of fluids often. Drink at least 2 quarts a day. Take sips frequently. Do not drink large amounts of fluid at one time. This may worsen the nausea.   Only take over-the-counter or prescription medicines for pain, discomfort, or fever as directed by your caregiver.   Drink clear liquids only. Those are anything you can see through such as water, broth, or soft drinks.   Once you are keeping clear liquids down, you may start full liquids, soups, juices, and ice cream or sherbet. Slowly add bland (plain, not spicy)  foods to your diet.  Home care instructions for diarrhea:  Diarrhea can be caused by bacterial infections or a virus. Your condition should improve with time, rest, fluids, and/or anti-diarrheal medication.   Until your diarrhea is under control, you should drink clear liquids often in small amounts. Clear liquids include: water, broth, jell-o water and weak tea.  Avoid:  Milk.   Fruits.   Tobacco.   Alcohol.   Extremely hot or cold fluids.   Too much intake of anything at one time.  When your diarrhea stops you may add the following foods, which help the stool to become more formed:  Rice.   Bananas.   Apples without skin.   Dry toast.  Once these foods are tolerated you may add low-fat yogurt and low-fat cottage cheese. They will help to restore the normal bacterial balance in your bowel. Wash your hands well to avoid spreading bacteria (germ) or virus. SEEK IMMEDIATE MEDICAL CARE IF:   You are unable to keep fluids down.   Vomiting or diarrhea become persistent (constant).   Abdominal pain develops, increases, or localizes. (Right sided pain can be appendicitis. Left sided pain in adults can be diverticulitis.)   You develop a fever (an oral temperature above 102 F (38.9 C)).   Diarrhea becomes excessive or contains blood or mucus.   You have excessive weakness, dizziness, fainting or extreme thirst.   You are not improving or you are getting worse.   You have any other questions or concerns.  Document Released: 12/22/2000 Document Revised: 12/17/2010 Document Reviewed: 12/28/2004 [employer] Patient Information 2012 [employer]. 

## 2011-04-27 NOTE — ED Provider Notes (Signed)
History  This chart was scribed for Carleene Cooper III, MD by Stevphen Meuse. This patient was seen in room APA01/APA01 and the patient's care was started at 9:47PM.  CSN: [id number]  Arrival date & time 04/27/11  4098   First MD Initiated Contact with Patient 04/27/11 2144      Chief Complaint  Patient presents with  . Abdominal Pain    The history is provided by the patient. No language interpreter was used.    Duane [name] is a 51 y.o. male who presents to the Emergency Department complaining of gradual onset, gradually worsening, constant non radiating epigastric abdominal pain. Pt has a h/o gout and reports having a severe gout flare up for the past 4 days and states that he took 4 diclofenac a day. Pt believes that he has taken too much diclofenac leading to his stomach being irritated. Pt rates the abdominal pain as a 8 out of 10 and describes the pain as being a burning and pressure-like sensation. Pt reports taking over the counter antacid medication with no relief. Pt states that the pain is worse with sitting up. He reports having normal BMs over the past 4 days. He states that he saw his podiatrist yesterday and received a pain shot for the gout. He reports that the flare up seems to be resolving with the pain shot. Pt denies vomiting, nausea, dysuria, hematochezia, hematuria, fever, neck pain, visual disturbance, SOB, chest pain, back pain, HA, adenopathy and confusion as associated symptoms. Pt has a h/o HTN and states that he takes medications for HTN as well. Pt denies a h/o smoking and alcohol use.   PCP is Dr. Sherwood Gambler Podiatrist is Dr. Pricilla Holm in [place].  Past Medical History  Diagnosis Date  . Gout   . Hypertension     Past Surgical History  Procedure Date  . Neck exploration   . Shoulder arthrotomy   . Foot fasciotomy     No family history on file.  History  Substance Use Topics  . Smoking status: Never Smoker   . Smokeless tobacco: Not on file  . Alcohol  Use: No     Review of Systems A complete 10 system review of systems was obtained and all systems are negative except as noted in the HPI and PMH.    Allergies  Codeine; Levofloxacin; and Penicillins  Home Medications  No current outpatient prescriptions on file.  Triage Vitals: BP 184/104  Pulse 77  Temp(Src) 98.4 F (36.9 C) (Oral)  Resp 20  Ht 5\' 9"  (1.753 m)  Wt 155 lb (70.308 kg)  BMI 22.89 kg/m2  SpO2 100%  Physical Exam  Nursing note and vitals reviewed. Constitutional: He is oriented to person, place, and time. He appears well-developed and well-nourished.  HENT:  Head: Normocephalic and atraumatic.  Eyes: Conjunctivae and EOM are normal.  Neck: Normal range of motion. Neck supple.  Cardiovascular: Normal rate, regular rhythm and normal heart sounds.   Pulmonary/Chest: Effort normal and breath sounds normal.  Abdominal: Soft. Bowel sounds are normal. There is tenderness (mild tenderness in the epigastric region).  Musculoskeletal: Normal range of motion. He exhibits no edema.  Neurological: He is alert and oriented to person, place, and time.  Skin: Skin is warm and dry.  Psychiatric: He has a normal mood and affect. His behavior is normal.    ED Course  Procedures (including critical care time)  DIAGNOSTIC STUDIES: Oxygen Saturation is 100% on room air, normal by my interpretation.  COORDINATION OF CARE: 9:52PM: Discussed not needing lab work due to physical exam findings and pt agreed. Will prescribe pro tonics and liquid antacid for pt to take. Will give pt first dose before discharging him.    1. Acute gastritis     I personally performed the services described in this documentation, which was scribed in my presence. The recorded information has been reviewed and considered.  Osvaldo Human, M.D.   Carleene Cooper III, MD 04/27/11 2226

## 2011-04-27 NOTE — ED Notes (Signed)
Pt with gout over the weekend and took nsaids 4 x a day now presents with abdominal pain.

## 2011-05-03 ENCOUNTER — Encounter: Payer: Self-pay | Admitting: Gastroenterology

## 2011-05-03 ENCOUNTER — Ambulatory Visit: Payer: Self-pay | Admitting: Gastroenterology

## 2011-05-03 ENCOUNTER — Ambulatory Visit (INDEPENDENT_AMBULATORY_CARE_PROVIDER_SITE_OTHER): Payer: Medicare Other | Admitting: Gastroenterology

## 2011-05-03 VITALS — BP 136/88 | HR 68 | Temp 97.2°F | Ht 69.0 in | Wt 148.4 lb

## 2011-05-03 DIAGNOSIS — R1013 Epigastric pain: Secondary | ICD-10-CM | POA: Insufficient documentation

## 2011-05-03 NOTE — Progress Notes (Signed)
Faxed to PCP

## 2011-05-03 NOTE — Patient Instructions (Signed)
Review the reflux diet. This will help you make healthy food choices.  Continue to take Omeprazole (make sure 20 mg), each morning, 30 minutes before breakfast for one month.  If you notice any recurrence of abdominal pain, pressure, reflux, nausea, vomiting, black stools, fatigue, etc, please call our office immediately.   Otherwise, we will see you back in 4-6 weeks. Avoid Voltaren, Ibuprofen, Advil, Aleve, Motrin, and anything like BC or Goody's powders.

## 2011-05-03 NOTE — Assessment & Plan Note (Signed)
51 year old male with acute onset of epigastric discomfort/pressure after ingesting large amounts of Voltaren for 3-4 days. No associated melena, hematochezia, N/V, GERD, dysphagia. No prior episodes. Presented to ED with significant relief of symptoms after GI cocktail. Now, several days later, has continued to avoid Voltaren and notes complete resolution of epigastric discomfort. Taking an OTC PPI sporadically.  Likely dealing with NSAID-induced gastritis. As pt's symptoms have completely resolved, and he has no concerning signs currently, we will continue the omeprazole daily for one month. Return to clinic in 4-6 weeks for evaluation. Hold off on upper EGD at this point; however, we will proceed with endoscopy if any recurrence of symptoms despite avoidance of NSAIDs in future. Reviewed in detail the signs/symptoms that would necessitate immediate evaluation or call to our office. He is well aware.  Continue PPI daily X 1 month GERD diet Return in 4-6 weeks Notify us if any changes, will see sooner and/or set up for outpatient EGD.

## 2011-05-03 NOTE — Progress Notes (Signed)
Primary Care Physician:  Cassell Smiles., MD, MD Primary Gastroenterologist:  Dr. Jena Gauss   Chief Complaint  Patient presents with  . Abdominal Pain  . Heartburn    HPI:   Duane [name] is a pleasant 51 year old male presenting today as a self-referral after an ED visit on April 16th for acute epigastric discomfort/pressure. Reports hx of gout in remote past, and he noted his big toe was painful, swollen. Concerned about flare. Decided to take Voltaren, but he took 4-6 a day for at least 3-4 days. Towards the end of this, began having epigastric pressure, took Rolaids. Pain worsened, presented to ED, given GI cocktail with relief. Next few days avoid Voltaren, took Rolaids, gradually noticed decrease in need for Rolaids. Was prescribed Protonix, took to pharmacy. Actually ended up buying a generic over-the-counter medication (Omeprazole) at the advice of pharmacist due to cost. He has only taken this once or twice. Avoiding spicy foods. Ate steak with A1 sauce without problems.  Acute epigastric discomfort completely resolved. Denies any prior episodes. No GERD, dysphagia, N/V, wt loss, lack of appetite. No N/V, melena, hematochezia.   Denies routine use of NSAIDs, aspirin powders. States "back to normal", exercising, feels great.   Past Medical History  Diagnosis Date  . Gout   . Hypertension     Past Surgical History  Procedure Date  . Neck exploration   . Shoulder arthrotomy     multiple   . Foot fasciotomy     X 5  . Hemorrhoid surgery     X2, Dr. Lovell Sheehan    Current Outpatient Prescriptions  Medication Sig Dispense Refill  . ALPRAZolam (XANAX) 1 MG tablet Take 1 mg by mouth at bedtime as needed.      . diclofenac (VOLTAREN) 75 MG EC tablet Take 75 mg by mouth 2 (two) times daily.      . enalapril (VASOTEC) 10 MG tablet Take 10 mg by mouth daily.      . pantoprazole (PROTONIX) 20 MG tablet Take 2 tablets (40 mg total) by mouth daily.  5 tablet  1    Allergies as of 05/03/2011  - Review Complete 05/03/2011  Allergen Reaction Noted  . Codeine    . Levofloxacin    . Penicillins      Family History  Problem Relation Age of Onset  . Colon cancer Neg Hx     History   Social History  . Marital Status: Single    Spouse Name: N/A    Number of Children: N/A  . Years of Education: N/A   Occupational History  . police officer    Social History Main Topics  . Smoking status: Never Smoker   . Smokeless tobacco: Not on file  . Alcohol Use: No  . Drug Use: No  . Sexually Active: Not on file   Other Topics Concern  . Not on file   Social History Narrative  . No narrative on file    Review of Systems: Gen: Denies any fever, chills, fatigue, weight loss, lack of appetite.  CV: Denies chest pain, heart palpitations, peripheral edema, syncope.  Resp: Denies shortness of breath at rest or with exertion. Denies wheezing or cough.  GI: Denies dysphagia or odynophagia. Denies jaundice, hematemesis, fecal incontinence. GU : Denies urinary burning, urinary frequency, urinary hesitancy MS: Denies joint pain, muscle weakness, cramps, or limitation of movement.  Derm: Denies rash, itching, dry skin Psych: Denies depression, anxiety, memory loss, and confusion Heme: Denies bruising, bleeding, and enlarged lymph  nodes.  Physical Exam: BP 136/88  Pulse 68  Temp(Src) 97.2 F (36.2 C) (Temporal)  Ht 5\' 9"  (1.753 m)  Wt 148 lb 6.4 oz (67.314 kg)  BMI 21.91 kg/m2 General:   Alert and oriented. Pleasant and cooperative. Well-nourished and well-developed.  Head:  Normocephalic and atraumatic. Eyes:  Without icterus, sclera clear and conjunctiva pink.  Ears:  Normal auditory acuity. Nose:  No deformity, discharge,  or lesions. Mouth:  No deformity or lesions, oral mucosa pink.  Neck:  Supple, without mass or thyromegaly. Lungs:  Clear to auscultation bilaterally. No wheezes, rales, or rhonchi. No distress.  Heart:  S1, S2 present without murmurs appreciated.    Abdomen:  +BS, soft, non-tender and non-distended. No HSM noted. No guarding or rebound. No masses appreciated.  Rectal:  Deferred  Msk:  Congenital deformity right arm/hand, otherwise normal Pulses:  Normal pulses noted. Extremities:  Without edema. Neurologic:  Alert and  oriented x4;  grossly normal neurologically. Skin:  Intact without significant lesions or rashes. Cervical Nodes:  No significant cervical adenopathy. Psych:  Alert and cooperative. Normal mood and affect.

## 2011-06-14 ENCOUNTER — Ambulatory Visit: Payer: Medicare Other | Admitting: Gastroenterology

## 2011-06-14 ENCOUNTER — Telehealth: Payer: Self-pay | Admitting: Gastroenterology

## 2011-06-14 NOTE — Telephone Encounter (Signed)
Pt was a no show

## 2011-08-31 ENCOUNTER — Emergency Department (HOSPITAL_COMMUNITY)
Admission: EM | Admit: 2011-08-31 | Discharge: 2011-08-31 | Disposition: A | Discharge status: Home / Self Care | Payer: Medicare Other | Attending: Emergency Medicine | Admitting: Emergency Medicine

## 2011-08-31 ENCOUNTER — Encounter: Payer: Self-pay | Admitting: Internal Medicine

## 2011-08-31 ENCOUNTER — Ambulatory Visit (INDEPENDENT_AMBULATORY_CARE_PROVIDER_SITE_OTHER): Payer: Medicare Other | Admitting: Internal Medicine

## 2011-08-31 ENCOUNTER — Encounter (HOSPITAL_COMMUNITY): Payer: Self-pay

## 2011-08-31 VITALS — BP 147/93 | HR 82 | Temp 98.4°F | Resp 17 | Ht 69.0 in | Wt 153.0 lb

## 2011-08-31 DIAGNOSIS — M79609 Pain in unspecified limb: Secondary | ICD-10-CM | POA: Insufficient documentation

## 2011-08-31 DIAGNOSIS — M542 Cervicalgia: Secondary | ICD-10-CM | POA: Insufficient documentation

## 2011-08-31 DIAGNOSIS — M109 Gout, unspecified: Secondary | ICD-10-CM | POA: Insufficient documentation

## 2011-08-31 DIAGNOSIS — I10 Essential (primary) hypertension: Secondary | ICD-10-CM | POA: Insufficient documentation

## 2011-08-31 DIAGNOSIS — G4700 Insomnia, unspecified: Secondary | ICD-10-CM | POA: Insufficient documentation

## 2011-08-31 DIAGNOSIS — M959 Acquired deformity of musculoskeletal system, unspecified: Secondary | ICD-10-CM

## 2011-08-31 DIAGNOSIS — G8929 Other chronic pain: Secondary | ICD-10-CM

## 2011-08-31 DIAGNOSIS — M79603 Pain in arm, unspecified: Secondary | ICD-10-CM

## 2011-08-31 DIAGNOSIS — F4310 Post-traumatic stress disorder, unspecified: Secondary | ICD-10-CM

## 2011-08-31 MED ORDER — OXYCODONE-ACETAMINOPHEN 5-325 MG PO TABS: 2.0000 | ORAL_TABLET | ORAL | Status: DC | PRN

## 2011-08-31 MED ORDER — PAROXETINE HCL 20 MG PO TABS: 20.0000 mg | ORAL_TABLET | ORAL | Status: DC

## 2011-08-31 MED ORDER — PROMETHAZINE HCL 25 MG PO TABS: 12.5000 mg | ORAL_TABLET | Freq: Four times a day (QID) | ORAL | Status: DC | PRN

## 2011-08-31 MED ORDER — OXYCODONE-ACETAMINOPHEN 5-325 MG PO TABS: 1.0000 | ORAL_TABLET | ORAL | Status: AC | PRN

## 2011-08-31 MED ORDER — KETOROLAC TROMETHAMINE 30 MG/ML IJ SOLN
60.0000 mg | Freq: Once | INTRAMUSCULAR | Status: AC
  Administered 2011-08-31: 60 mg via INTRAMUSCULAR
  Filled 2011-08-31: qty 2

## 2011-08-31 MED ORDER — GABAPENTIN 400 MG PO CAPS
400.0000 mg | ORAL_CAPSULE | Freq: Once | ORAL | Status: AC
  Administered 2011-08-31: 400 mg via ORAL
  Filled 2011-08-31: qty 1

## 2011-08-31 MED ORDER — ALPRAZOLAM 1 MG PO TABS: ORAL_TABLET | ORAL | Status: DC

## 2011-08-31 NOTE — ED Provider Notes (Signed)
History     CSN: [id number]  Arrival date & time 08/31/11  0425   First MD Initiated Contact with Patient 08/31/11 [phone]      Chief Complaint  Patient presents with  . Arm Pain    (Consider location/radiation/quality/duration/timing/severity/associated sxs/prior treatment) HPI Duane [name] is a 51 y.o. male with a h/o congenital right arm withering who presents to the Emergency Department complaining of sharp stabbing intermittent pains in the right arm that occur in cycles. Current cycle began two days ago and has not responded to voltaren. No recent injury. Pain is a burning stabbing pain that goes from his shoulder to his hand.  PCP Dr. Sherwood Gambler   Past Medical History  Diagnosis Date  . Gout   . Hypertension     Past Surgical History  Procedure Date  . Neck exploration   . Shoulder arthrotomy     multiple   . Foot fasciotomy     X 5  . Hemorrhoid surgery     X2, Dr. Lovell Sheehan    Family History  Problem Relation Age of Onset  . Colon cancer Neg Hx     History  Substance Use Topics  . Smoking status: Never Smoker   . Smokeless tobacco: Not on file  . Alcohol Use: No      Review of Systems  Constitutional: Negative for fever.       10 Systems reviewed and are negative for acute change except as noted in the HPI.  HENT: Negative for congestion.   Eyes: Negative for discharge and redness.  Respiratory: Negative for cough and shortness of breath.   Cardiovascular: Negative for chest pain.  Gastrointestinal: Negative for vomiting and abdominal pain.  Musculoskeletal: Negative for back pain.       Right arm pain  Skin: Negative for rash.  Neurological: Negative for syncope, numbness and headaches.  Psychiatric/Behavioral:       No behavior change.    Allergies  Codeine; Hydrocodone; Levofloxacin; Oxycodone; and Penicillins  Home Medications   Current Outpatient Rx  Name Route Sig Dispense Refill  . ALPRAZOLAM 1 MG PO TABS Oral Take 1 mg by mouth at  bedtime as needed.    . ENALAPRIL MALEATE 10 MG PO TABS Oral Take 10 mg by mouth daily.    Marland Kitchen ALPRAZOLAM 1 MG PO TABS  1 am, 1 midday, 2 hs 120 tablet 1  . DICLOFENAC SODIUM 75 MG PO TBEC Oral Take 75 mg by mouth 2 (two) times daily.    . OXYCODONE-ACETAMINOPHEN 5-325 MG PO TABS Oral Take 1 tablet by mouth every 4 (four) hours as needed for pain. 15 tablet 0  . PANTOPRAZOLE SODIUM 20 MG PO TBEC Oral Take 2 tablets (40 mg total) by mouth daily. 5 tablet 1  . PAROXETINE HCL 20 MG PO TABS Oral Take 1 tablet (20 mg total) by mouth every morning. 30 tablet 1  . PROMETHAZINE HCL 25 MG PO TABS Oral Take 0.5 tablets (12.5 mg total) by mouth every 6 (six) hours as needed for nausea. 20 tablet 0    BP 135/98  Pulse 80  Temp 97.9 F (36.6 C) (Oral)  Resp 18  Ht 5\' 9"  (1.753 m)  Wt 155 lb (70.308 kg)  BMI 22.89 kg/m2  SpO2 100%  Physical Exam  Nursing note and vitals reviewed. Constitutional:       Awake, alert, nontoxic appearance.  HENT:  Head: Atraumatic.  Eyes: Right eye exhibits no discharge. Left eye exhibits no discharge.  Neck: Neck supple.  Cardiovascular: Normal heart sounds.   Pulmonary/Chest: Effort normal and breath sounds normal. He exhibits no tenderness.  Abdominal: Soft. There is no tenderness. There is no rebound.  Musculoskeletal: He exhibits no tenderness.       Baseline ROM, no obvious new focal weakness.Congenital atrophy of right arm with shortening and limited use. Sensation intact. Limited ROM at shoulder and elbow due to deformity.   Neurological:       Mental status and motor strength appears baseline for patient and situation.  Skin: No rash noted.  Psychiatric: He has a normal mood and affect.    ED Course  Procedures (including critical care time)    1. 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 GIven gabapentin and ketorolac. Rx for percocet and phenergan. Encouraged follow up with PCP  and with neurosurgeon. Pt stable in ED with no significant deterioration in condition.The patient appears reasonably screened and/or stabilized for discharge and I doubt any other medical condition or other [facility] requiring further screening, evaluation, or treatment in the ED at this time prior to discharge.  MDM Reviewed: nursing note and vitals          Nicoletta Dress. Colon Branch, MD 08/31/11 2149

## 2011-08-31 NOTE — ED Notes (Signed)
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

## 2011-08-31 NOTE — Patient Instructions (Addendum)
Others include lexapro,celexa,zoloft,wellbutrin
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
## 2012-01-31 ENCOUNTER — Telehealth: Payer: Self-pay | Admitting: *Deleted

## 2012-01-31 MED ORDER — ALPRAZOLAM 1 MG PO TABS: 1.0000 mg | ORAL_TABLET | Freq: Every evening | ORAL | Status: DC | PRN

## 2012-01-31 NOTE — Telephone Encounter (Signed)
Meds ordered this encounter  Medications  . ALPRAZolam (XANAX) 1 MG tablet    Sig: Take 1 tablet (1 mg total) by mouth at bedtime as needed.    Dispense:  30 tablet    Refill:  0

## 2012-01-31 NOTE — Telephone Encounter (Signed)
[facility] pharmacy requesting refill on alprazolam.  Last fill 12/24/11

## 2012-01-31 NOTE — Telephone Encounter (Signed)
rx called in

## 2012-08-04 ENCOUNTER — Ambulatory Visit (HOSPITAL_COMMUNITY)
Admission: RE | Admit: 2012-08-04 | Discharge: 2012-08-04 | Disposition: A | Discharge status: Home / Self Care | Payer: Medicare Other | Source: Ambulatory Visit | Attending: Orthopedic Surgery | Admitting: Orthopedic Surgery

## 2012-08-04 DIAGNOSIS — IMO0001 Reserved for inherently not codable concepts without codable children: Secondary | ICD-10-CM | POA: Insufficient documentation

## 2012-08-04 DIAGNOSIS — I10 Essential (primary) hypertension: Secondary | ICD-10-CM | POA: Insufficient documentation

## 2012-08-04 DIAGNOSIS — M25519 Pain in unspecified shoulder: Secondary | ICD-10-CM | POA: Insufficient documentation

## 2012-08-04 DIAGNOSIS — M6281 Muscle weakness (generalized): Secondary | ICD-10-CM | POA: Insufficient documentation

## 2012-08-04 DIAGNOSIS — M25512 Pain in left shoulder: Secondary | ICD-10-CM

## 2012-08-04 DIAGNOSIS — S43439A Superior glenoid labrum lesion of unspecified shoulder, initial encounter: Secondary | ICD-10-CM | POA: Insufficient documentation

## 2012-08-04 NOTE — Evaluation (Signed)
Occupational Therapy Evaluation  Patient Details  Name: Duane [name] MRN: [id number] Date of Birth: 1960/05/12  Today's Date: 08/04/2012 Time: 8295-6213 OT Time Calculation (min): 45 min OT Evaluation 1430-1455 25' Manual Therapy 0865-7846 20' Visit#: 1 of 24  Re-eval: 09/01/12  Assessment Diagnosis: S/P SLAP Repair of Left Shoulder Surgical Date: 06/30/12 Next MD Visit: 2 weeks Prior Therapy: n/a  Authorization: Medicare  Authorization Time Period: before 10th visit  Authorization Visit#: 1 of 10   Past Medical History:  Past Medical History  Diagnosis Date  . Gout   . Hypertension    Past Surgical History:  Past Surgical History  Procedure Laterality Date  . Neck exploration    . Shoulder arthrotomy      multiple   . Foot fasciotomy      X 5  . Hemorrhoid surgery      X2, Dr. Lovell Sheehan    Subjective Symptoms/Limitations Symptoms: S:  I had surgery on 06/30/12. Pertinent History: [name] had SLAP surgery on 06/30/12 to repair the labrum of his left shoulder.  He had been experiencing increased pain and decreased mobility in his left shoulder for several months.  He has been referred to occupational therapy for evaluation and treatment following Dr. Gayla Medicus protocol. Limitations: Protocol:  6 weeks (through 08/10/12) ER 30, ABd 60, flexion 90, no resisted protraction.  08/11/12 and beyond, progress as tolerated (PROTOCOL SCANNED).  Please note due to limited use of RUE, [name] was beyond these limitations at initial evaluation. AVOID combined ER/ABD Patient Stated Goals: "I want to be pain free." Pain Assessment Currently in Pain?: Yes Pain Score: 4  Pain Location: Shoulder Pain Orientation: Left Pain Type: Acute pain  Precautions/Restrictions  Precautions Precautions: Shoulder Type of Shoulder Precautions: limitations through7/31/14 Restrictions Weight Bearing Restrictions: No  Balance Screening Balance Screen Has the patient fallen in the past 6 months:  No Has the patient had a decrease in activity level because of a fear of falling? : No Is the patient reluctant to leave their home because of a fear of falling? 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  Assessment ADL/Vision/Perception ADL ADL Comments: Unable to use his left hand as dominant.  Unable to reach overhead or behind back without increased pain. Dominant Hand: Left  Cognition/Observation Cognition Orientation Level: Oriented X4  Sensation/Coordination/Edema Sensation Light Touch: Appears Intact Coordination Gross Motor Movements are Fluid and Coordinated: Yes Fine Motor Movements are Fluid and Coordinated: Yes  Additional Assessments LUE AROM (degrees) LUE Overall AROM Comments: assessed in supine, ER/IR with shoulder adducted  Left Shoulder Flexion: 170 Degrees Left Shoulder ABduction: 150 Degrees Left Shoulder Internal Rotation: 90 Degrees Left Shoulder External Rotation: 15 Degrees LUE PROM (degrees) LUE Overall PROM Comments: assessed in supine, ER/IR with shoulder adducted Left Shoulder Flexion: 170 Degrees Left Shoulder ABduction: 150 Degrees Left Shoulder Internal Rotation: 90 Degrees Left Shoulder External Rotation: 20 Degrees LUE Strength LUE Overall Strength Comments: not assessed due to recent surgery Palpation Palpation: max fascial restrictions in left upper arm and scapular region     Exercise/Treatments    Manual Therapy Manual Therapy: Myofascial release Myofascial Release: Myofascial release and manual stretching to left upper arm, scapular, and shoulder region to decrease pain and fascial restrictions  and  increase pain free mobility.   Occupational Therapy Assessment and Plan OT Assessment and Plan Clinical Impression Statement: A:  Patient presents S/P SLAP repair with decreased mobiity and increased pain, causing decreased I with all B/IADLs, work, leisure activities.   Pt will benefit from skilled therapeutic intervention in order to improve on the following deficits: Decreased range of motion;Decreased strength;Increased muscle spasms;Increased fascial restricitons;Pain Rehab Potential: Excellent OT Frequency: Min 3X/week OT Duration: 8 weeks OT Treatment/Interventions: Self-care/ADL training;Therapeutic exercise;Modalities;Manual therapy;Patient/family education OT Plan: P:  Skilled OT intervention to decrease pain and fascial restrictions and increase mobility and strength in LUE in order to return to prior level of I with all B/IADLs, work, and leisure activities.  Follow Protocol:  MFR and manual stretching in supine.  PROM, progress to [facility] 08/11/12.   ball stretches, isometric strengthening. elev, ext, row.   Goals Short Term Goals Time to Complete Short Term Goals: 4 weeks Short Term Goal 1: Patient will be educated on HEP. Short Term Goal 2: Patient will increase left external rotation PROM to [facility] for increased ability to reach behind his head. Short Term Goal 3: Patient will increase left shoulder strength to 4/5 for increased ability to lift bags of dog food. Short Term Goal 4: Patient will decrease pain in his left shoulder to 3/10 when completing daily activities.  Short Term Goal 5: Patient will decrease fascial restrictions to moderate in his left shoulder region.  Long Term Goals Time to Complete Long Term Goals: 8 weeks Long Term Goal 1: Patient will return to prior level of independence will all B/IADLs, work, and leisure activities.  Long Term Goal 2: Patient will increase left shoulder AROM to WNL for increased ability to reach overhead and behind back. Long Term Goal 3:  Patient will increase left shoulder strength to 5/5 for increased ability to complete all necessary job duties to return to work.  Long Term Goal 4: Patient will decrease pain in his left shoulder to 1/10 when completing daily activities.  Long Term Goal 5: Patient will decrease fascial restrictions to minimal in his left shoulder region.   Problem List Patient Active Problem List   Diagnosis Date Noted  . SLAP lesion of shoulder 08/04/2012  . Muscle weakness (generalized) 08/04/2012  . Chronic pain 08/31/2011  . Deformity, acquired 08/31/2011  . Hypertension 08/31/2011  . Insomnia 08/31/2011  . Epigastric discomfort 05/03/2011  . LUMBAR SPRAIN AND STRAIN 10/22/2009  . CUBITAL TUNNEL SYNDROME 10/24/2007  . SHOULDER PAIN 10/24/2007  . 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 ELBOW PAIN 05/25/2007    End of Session Activity Tolerance: Patient tolerated treatment well General Behavior During Therapy: [facility] for tasks assessed/performed OT Plan of Care OT Home Exercise Plan: towel slides Consulted and Agree with Plan of Care: Patient  GO Functional Assessment Tool Used: clinical observation (will complete DASH at next visit). Functional Limitation: Carrying, moving and handling objects Carrying, Moving and Handling Objects Current Status [phone]): At least 40 percent but less than 60 percent impaired, limited or restricted Carrying, Moving and Handling Objects Goal Status [phone]): At least 1 percent but less than 20 percent impaired, limited or restricted  Shirlean Mylar, OTR/L  08/04/2012, 3:47 PM  Physician Documentation Your signature is required to indicate approval of the treatment plan as stated above.  Please sign and either send electronically or make a copy of this report for your files and return this physician signed original.  Please mark one 1.__approve of plan  2. ___approve of plan with the following conditions.   ______________________________                                                           _____________________ Physician Signature                                                                                                             Date

## 2012-08-09 ENCOUNTER — Ambulatory Visit (HOSPITAL_COMMUNITY)
Admission: RE | Admit: 2012-08-09 | Discharge: 2012-08-09 | Disposition: A | Discharge status: Home / Self Care | Payer: Medicare Other | Source: Ambulatory Visit | Attending: Orthopedic Surgery | Admitting: Orthopedic Surgery

## 2012-08-09 DIAGNOSIS — M6281 Muscle weakness (generalized): Secondary | ICD-10-CM

## 2012-08-09 NOTE — Progress Notes (Signed)
Occupational Therapy Treatment Patient Details  Name: Duane [name] MRN: [id number] Date of Birth: 01/04/1961  Today's Date: 08/09/2012 Time: 4098-1191 OT Time Calculation (min): 30 min MFR 478-295 10' Therex 621-308 20'  Visit#: 2 of 24  Re-eval: 09/01/12    Authorization: Medicare  Authorization Time Period: before 10th visit  Authorization Visit#: 2 of 10  Subjective Symptoms/Limitations Symptoms: S: I'm feeling sore today.  Pain Assessment Currently in Pain?: Yes Pain Score: 4  Pain Location: Shoulder Pain Orientation: Left Pain Type: Acute pain  Precautions/Restrictions  Precautions Precautions: Shoulder Type of Shoulder Precautions: limitations through7/31/14  Exercise/Treatments Supine Horizontal ABduction: PROM;10 reps External Rotation: PROM;10 reps Internal Rotation: PROM;10 reps Flexion: PROM;10 reps ABduction: PROM;10 reps Seated Elevation: AROM;12 reps Extension: AROM;12 reps Row: AROM;12 repsTherapy Ball Flexion: 15 reps ABduction: 15 reps ROM / Strengthening / Isometric Strengthening Thumb Tacks: 1' Prot/Ret//Elev/Dep: 1'       Manual Therapy Manual Therapy: Myofascial release Myofascial Release: Myofascial release and manual stretching to left upper arm, scapular, and shoulder region to decrease pain and fascial restrictions and increase pain free mobility.  Occupational Therapy Assessment and Plan OT Assessment and Plan Clinical Impression Statement: A: Patient tolerated all exercises and manual stretching with mild soreness.  OT Plan: P: Per protocol, begin AAROM supine.    Goals Short Term Goals Time to Complete Short Term Goals: 4 weeks Short Term Goal 1: Patient will be educated on HEP. Short Term Goal 1 Progress: Progressing toward goal Short Term Goal 2: Patient will increase left external rotation PROM to [facility] for increased ability to reach behind his head. Short Term Goal 2 Progress: Progressing toward goal Short Term Goal 3:  Patient will increase left shoulder strength to 4/5 for increased ability to lift bags of dog food. Short Term Goal 3 Progress: Progressing toward goal Short Term Goal 4: Patient will decrease pain in his left shoulder to 3/10 when completing daily activities.  Short Term Goal 4 Progress: Progressing toward goal Short Term Goal 5: Patient will decrease fascial restrictions to moderate in his left shoulder region.  Short Term Goal 5 Progress: Progressing toward goal Long Term Goals Time to Complete Long Term Goals: 8 weeks Long Term Goal 1: Patient will return to prior level of independence will all B/IADLs, work, and leisure activities.  Long Term Goal 1 Progress: Progressing toward goal Long Term Goal 2: Patient will increase left shoulder AROM to WNL for increased ability to reach overhead and behind back. Long Term Goal 2 Progress: Progressing toward goal Long Term Goal 3: Patient will increase left shoulder strength to 5/5 for increased ability to complete all necessary job duties to return to work.  Long Term Goal 3 Progress: Progressing toward goal Long Term Goal 4: Patient will decrease pain in his left shoulder to 1/10 when completing daily activities.  Long Term Goal 4 Progress: Progressing toward goal Long Term Goal 5: Patient will decrease fascial restrictions to minimal in his left shoulder region.  Long Term Goal 5 Progress: Progressing toward goal  Problem List Patient Active Problem List   Diagnosis Date Noted  . SLAP lesion of shoulder 08/04/2012  . Muscle weakness (generalized) 08/04/2012  . Chronic pain 08/31/2011  . Deformity, acquired 08/31/2011  . Hypertension 08/31/2011  . Insomnia 08/31/2011  . Epigastric discomfort 05/03/2011  . LUMBAR SPRAIN AND STRAIN 10/22/2009  . CUBITAL TUNNEL SYNDROME 10/24/2007  . SHOULDER PAIN 10/24/2007  . 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 ELBOW PAIN 05/25/2007  End of Session Activity Tolerance: Patient tolerated  treatment well General Behavior During Therapy: WFL for tasks assessed/performed  GO Functional Assessment Tool Used: DASH score of 72.7 with ideal score of 0. Functional Limitation: Carrying, moving and handling objects Carrying, Moving and Handling Objects Current Status [phone]): At least 60 percent but less than 80 percent impaired, limited or restricted Carrying, Moving and Handling Objects Goal Status [phone]): At least 1 percent but less than 20 percent impaired, limited or restricted  Duane [name], OTR/L,CBIS   08/09/2012, 8:40 AM

## 2012-08-11 ENCOUNTER — Ambulatory Visit (HOSPITAL_COMMUNITY)
Admission: RE | Admit: 2012-08-11 | Discharge: 2012-08-11 | Disposition: A | Discharge status: Home / Self Care | Payer: Medicare Other | Source: Ambulatory Visit | Attending: Internal Medicine | Admitting: Internal Medicine

## 2012-08-11 DIAGNOSIS — M25519 Pain in unspecified shoulder: Secondary | ICD-10-CM | POA: Insufficient documentation

## 2012-08-11 DIAGNOSIS — I10 Essential (primary) hypertension: Secondary | ICD-10-CM | POA: Insufficient documentation

## 2012-08-11 DIAGNOSIS — M6281 Muscle weakness (generalized): Secondary | ICD-10-CM

## 2012-08-11 DIAGNOSIS — IMO0001 Reserved for inherently not codable concepts without codable children: Secondary | ICD-10-CM | POA: Insufficient documentation

## 2012-08-11 NOTE — Progress Notes (Signed)
Occupational Therapy Treatment Patient Details  Name: Duane [name] MRN: [id number] Date of Birth: 02/12/1960  Today's Date: 08/11/2012 Time: 4098-1191 OT Time Calculation (min): 38 min MFR 4782-9562 10' Therex 1308-6578 28'  Visit#: 3 of 24  Re-eval: 09/01/12    Authorization: Medicare  Authorization Time Period: before 10th visit  Authorization Visit#: 3 of 10  Subjective Symptoms/Limitations Symptoms: S: I'm only a little sore today. Overall, it feel good. It used to take me a long time to get my wallet out of my back pocket. It's getting a little easier now.  Pain Assessment Currently in Pain?: Yes Pain Score: 4  Pain Location: Shoulder Pain Orientation: Left Pain Type: Acute pain  Precautions/Restrictions   follow shoulder protocol.   Exercise/Treatments Supine Horizontal ABduction: PROM;AAROM;10 reps External Rotation: PROM;AAROM;10 reps Internal Rotation: PROM;AAROM;10 reps Flexion: PROM;AAROM;10 reps ABduction: PROM;AAROM;10 reps Seated Elevation: AROM;12 reps Extension: AROM;12 reps Row: AROM;12 reps Protraction: AAROM;10 reps Horizontal ABduction: AAROM;10 reps External Rotation: AAROM;10 reps Internal Rotation: AAROM;10 reps Flexion: AAROM;10 reps Abduction: AAROM;10 reps Pulleys Flexion: 1 minute ABduction: 1 minute ROM / Strengthening / Isometric Strengthening Wall Wash: 1' Thumb Tacks: 1' Proximal Shoulder Strengthening, Supine: 10X Prot/Ret//Elev/Dep: 1'      Manual Therapy Manual Therapy: Myofascial release Myofascial Release: Myofascial release and manual stretching to left upper arm, scapular, and shoulder region to decrease pain and fascial restrictions and increase pain free mobility.  Occupational Therapy Assessment and Plan OT Assessment and Plan Clinical Impression Statement: A: Performed AAROM supine and seated with therapist supporting arm through the movements. Added slow wall wash and pulleys. Patient tolerated well. Mild  soreness at end of session.  OT Plan: P: Progress to AROM supine as patient is able to tolerate.   Goals Short Term Goals Time to Complete Short Term Goals: 4 weeks Short Term Goal 1: Patient will be educated on HEP. Short Term Goal 2: Patient will increase left external rotation PROM to [facility] for increased ability to reach behind his head. Short Term Goal 3: Patient will increase left shoulder strength to 4/5 for increased ability to lift bags of dog food. Short Term Goal 4: Patient will decrease pain in his left shoulder to 3/10 when completing daily activities.  Short Term Goal 5: Patient will decrease fascial restrictions to moderate in his left shoulder region.  Long Term Goals Time to Complete Long Term Goals: 8 weeks Long Term Goal 1: Patient will return to prior level of independence will all B/IADLs, work, and leisure activities.  Long Term Goal 2: Patient will increase left shoulder AROM to WNL for increased ability to reach overhead and behind back. Long Term Goal 3: Patient will increase left shoulder strength to 5/5 for increased ability to complete all necessary job duties to return to work.  Long Term Goal 4: Patient will decrease pain in his left shoulder to 1/10 when completing daily activities.  Long Term Goal 5: Patient will decrease fascial restrictions to minimal in his left shoulder region.   Problem List Patient Active Problem List   Diagnosis Date Noted  . SLAP lesion of shoulder 08/04/2012  . Muscle weakness (generalized) 08/04/2012  . Chronic pain 08/31/2011  . Deformity, acquired 08/31/2011  . Hypertension 08/31/2011  . Insomnia 08/31/2011  . Epigastric discomfort 05/03/2011  . LUMBAR SPRAIN AND STRAIN 10/22/2009  . CUBITAL TUNNEL SYNDROME 10/24/2007  . SHOULDER PAIN 10/24/2007  . 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 ELBOW PAIN 05/25/2007    End of Session Activity Tolerance:  Patient tolerated treatment well General Behavior During Therapy: [facility] for  tasks assessed/performed   [name], OTR/L,CBIS   08/11/2012, 1:45 PM

## 2012-08-14 ENCOUNTER — Ambulatory Visit (HOSPITAL_COMMUNITY)
Admission: RE | Admit: 2012-08-14 | Discharge: 2012-08-14 | Disposition: A | Discharge status: Home / Self Care | Payer: Medicare Other | Source: Ambulatory Visit | Attending: Orthopedic Surgery | Admitting: Orthopedic Surgery

## 2012-08-14 DIAGNOSIS — S43432D Superior glenoid labrum lesion of left shoulder, subsequent encounter: Secondary | ICD-10-CM

## 2012-08-14 DIAGNOSIS — M6281 Muscle weakness (generalized): Secondary | ICD-10-CM

## 2012-08-14 NOTE — Progress Notes (Signed)
Occupational Therapy Treatment Patient Details  Name: Duane [name] MRN: [id number] Date of Birth: 01-13-1960  Today's Date: 08/14/2012 Time: 4098-1191 OT Time Calculation (min): 40 min Manual Therapy [id number] 15' Therapeutic exercises 1308-6578 25' Visit#: 4 of 24  Re-eval: 09/01/12    Authorization: Medicare  Authorization Time Period: before 10th visit  Authorization Visit#: 4 of 10  Subjective S:  Its still a bit sore. Limitations: Protocol:  6 weeks (through 08/10/12) ER 30, ABd 60, flexion 90, no resisted protraction.  08/11/12 and beyond, progress as tolerated (PROTOCOL SCANNED).  Please note due to limited use of RUE, Duane [name] was beyond these limitations at initial evaluation. AVOID combined ER/ABD Pain Assessment Currently in Pain?: Yes Pain Score: 4  Pain Location: Shoulder Pain Orientation: Left Pain Type: Acute pain  Precautions/Restrictions     6 weeks (through 08/10/12) ER 30, ABd 60, flexion 90, no resisted protraction.  08/11/12 and beyond, progress as tolerated (PROTOCOL SCANNED).  Please note due to limited use of RUE, Duane [name] was beyond these limitations at initial evaluation. AVOID combined ER/ABD   Exercise/Treatments Supine Protraction: PROM;AAROM;10 reps Horizontal ABduction: PROM;AAROM;10 reps External Rotation: PROM;AAROM;10 reps Internal Rotation: PROM;AAROM;10 reps Flexion: PROM;AAROM;10 reps ABduction: PROM;AAROM;10 reps Seated Elevation: AROM;15 reps Extension: AROM;15 reps Row: AROM;15 reps Protraction: AAROM;10 reps Horizontal ABduction: AAROM;10 reps External Rotation: AAROM;10 reps Internal Rotation: AAROM;10 reps Flexion: AAROM;10 reps Abduction: AAROM;10 reps Pulleys Flexion: 1 minute ABduction: 1 minute Therapy Ball Flexion: 20 reps ABduction: 20 reps ROM / Strengthening / Isometric Strengthening Wall Wash: 2' Thumb Tacks: 1' Proximal Shoulder Strengthening, Supine: resume next visit Prot/Ret//Elev/Dep: 1'      Manual  Therapy Manual Therapy: Myofascial release Myofascial Release: Myofascial release and manual stretching to left upper arm, scapular, and shoulder region to decrease pain and fascial restrictions and increase pain free mobility  Occupational Therapy Assessment and Plan OT Assessment and Plan Clinical Impression Statement: A: Performed AAROM supine and seated with therapist supporting arm through the movements. Minimal to no pain with AAROM in supine and seated. Pt will benefit from skilled therapeutic intervention in order to improve on the following deficits: Cardiopulmonary status limiting activity OT Plan: P: Progress to AROM supine and seated.  Add proximal shoulder strengthening in supine.    Goals Short Term Goals Time to Complete Short Term Goals: 4 weeks Short Term Goal 1: Patient will be educated on HEP. Short Term Goal 2: Patient will increase left external rotation PROM to [facility] for increased ability to reach behind his head. Short Term Goal 2 Progress: Progressing toward goal Short Term Goal 3: Patient will increase left shoulder strength to 4/5 for increased ability to lift bags of dog food. Short Term Goal 3 Progress: Progressing toward goal Short Term Goal 4: Patient will decrease pain in his left shoulder to 3/10 when completing daily activities.  Short Term Goal 4 Progress: Progressing toward goal Short Term Goal 5: Patient will decrease fascial restrictions to moderate in his left shoulder region.  Short Term Goal 5 Progress: Progressing toward goal Long Term Goals Time to Complete Long Term Goals: 8 weeks Long Term Goal 1: Patient will return to prior level of independence will all B/IADLs, work, and leisure activities.  Long Term Goal 1 Progress: Progressing toward goal Long Term Goal 2: Patient will increase left shoulder AROM to WNL for increased ability to reach overhead and behind back. Long Term Goal 2 Progress: Progressing toward goal Long Term Goal 3: Patient will  increase left shoulder strength  to 5/5 for increased ability to complete all necessary job duties to return to work.  Long Term Goal 3 Progress: Progressing toward goal Long Term Goal 4: Patient will decrease pain in his left shoulder to 1/10 when completing daily activities.  Long Term Goal 4 Progress: Progressing toward goal Long Term Goal 5: Patient will decrease fascial restrictions to minimal in his left shoulder region.  Long Term Goal 5 Progress: Progressing toward goal  Problem List Patient Active Problem List   Diagnosis Date Noted  . SLAP lesion of shoulder 08/04/2012  . Muscle weakness (generalized) 08/04/2012  . Chronic pain 08/31/2011  . Deformity, acquired 08/31/2011  . Hypertension 08/31/2011  . Insomnia 08/31/2011  . Epigastric discomfort 05/03/2011  . LUMBAR SPRAIN AND STRAIN 10/22/2009  . CUBITAL TUNNEL SYNDROME 10/24/2007  . SHOULDER PAIN 10/24/2007  . 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 ELBOW PAIN 05/25/2007    End of Session Activity Tolerance: Patient tolerated treatment well General Behavior During Therapy: [facility] for tasks assessed/performed  GO    Shirlean Mylar, OTR/L  08/14/2012, 1:49 PM

## 2012-08-16 ENCOUNTER — Ambulatory Visit (HOSPITAL_COMMUNITY)
Admission: RE | Admit: 2012-08-16 | Discharge: 2012-08-16 | Disposition: A | Discharge status: Home / Self Care | Payer: Medicare Other | Source: Ambulatory Visit | Attending: Internal Medicine | Admitting: Internal Medicine

## 2012-08-16 DIAGNOSIS — M6281 Muscle weakness (generalized): Secondary | ICD-10-CM

## 2012-08-16 NOTE — Progress Notes (Signed)
Occupational Therapy Treatment Patient Details  Name: Duane [name] MRN: [id number] Date of Birth: [birth year]  Today's Date: 08/16/2012 Time: 1350-1430 OT Time Calculation (min): 40 min MFR 1350-1400 10' Therex 1400-1430 30'  Visit#: 5 of 24  Re-eval: 09/01/12    Authorization: Medicare  Authorization Time Period: before 10th visit  Authorization Visit#: 5 of 10  Subjective Symptoms/Limitations Symptoms: S: My arm is still the same.  Pain Assessment Currently in Pain?: Yes Pain Score: 3  Pain Location: Shoulder Pain Orientation: Left Pain Type: Acute pain  Precautions/Restrictions  Precautions Precautions: Shoulder Type of Shoulder Precautions: limitations through7/31/14  Exercise/Treatments Supine Protraction: PROM;10 reps;AROM;12 reps Horizontal ABduction: PROM;5 reps;AROM;12 reps External Rotation: PROM;5 reps;AROM;12 reps Internal Rotation: PROM;5 reps;AROM;12 reps Flexion: PROM;5 reps;AROM;12 reps ABduction: PROM;5 reps;AROM;12 reps Seated Elevation: AROM;15 reps Extension: AROM;15 reps Retraction: AROM;15 reps Row: AROM;15 reps Protraction: AROM;12 reps Horizontal ABduction: AROM;12 reps External Rotation: AROM;12 reps Internal Rotation: AROM;12 reps Flexion: AROM;12 reps Abduction: AROM;12 reps ROM / Strengthening / Isometric Strengthening Wall Wash: 2' Thumb Tacks: 1' Proximal Shoulder Strengthening, Supine: 12X Proximal Shoulder Strengthening, Seated: 12X Prot/Ret//Elev/Dep: 1'       Manual Therapy Manual Therapy: Myofascial release Myofascial Release: Myofascial release and manual stretching to left upper arm, scapular, and shoulder region to decrease pain and fascial restrictions and increase pain free mobility  Occupational Therapy Assessment and Plan OT Assessment and Plan Clinical Impression Statement: A: Added AROM supine and seated. Patient tolerated all. Some shoulder fatigue towards end of session during wall wash and thumb tacks. No  pain during tx session. OT Plan: P: Add UBE bike.    Goals Short Term Goals Time to Complete Short Term Goals: 4 weeks Short Term Goal 1: Patient will be educated on HEP. Short Term Goal 2: Patient will increase left external rotation PROM to [facility] for increased ability to reach behind his head. Short Term Goal 3: Patient will increase left shoulder strength to 4/5 for increased ability to lift bags of dog food. Short Term Goal 4: Patient will decrease pain in his left shoulder to 3/10 when completing daily activities.  Short Term Goal 5: Patient will decrease fascial restrictions to moderate in his left shoulder region.  Long Term Goals Time to Complete Long Term Goals: 8 weeks Long Term Goal 1: Patient will return to prior level of independence will all B/IADLs, work, and leisure activities.  Long Term Goal 2: Patient will increase left shoulder AROM to WNL for increased ability to reach overhead and behind back. Long Term Goal 3: Patient will increase left shoulder strength to 5/5 for increased ability to complete all necessary job duties to return to work.  Long Term Goal 4: Patient will decrease pain in his left shoulder to 1/10 when completing daily activities.  Long Term Goal 5: Patient will decrease fascial restrictions to minimal in his left shoulder region.   Problem List Patient Active Problem List   Diagnosis Date Noted  . SLAP lesion of shoulder 08/04/2012  . Muscle weakness (generalized) 08/04/2012  . Chronic pain 08/31/2011  . Deformity, acquired 08/31/2011  . Hypertension 08/31/2011  . Insomnia 08/31/2011  . Epigastric discomfort 05/03/2011  . LUMBAR SPRAIN AND STRAIN 10/22/2009  . CUBITAL TUNNEL SYNDROME 10/24/2007  . SHOULDER PAIN 10/24/2007  . 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 ELBOW PAIN 05/25/2007    End of Session Activity Tolerance: Patient tolerated treatment well General Behavior During Therapy: [facility] for tasks assessed/performed   [name], OTR/L,CBIS  08/16/2012, 3:34 PM

## 2012-08-18 ENCOUNTER — Ambulatory Visit (HOSPITAL_COMMUNITY)
Admission: RE | Admit: 2012-08-18 | Discharge: 2012-08-18 | Disposition: A | Discharge status: Home / Self Care | Payer: Medicare Other | Source: Ambulatory Visit | Attending: Internal Medicine | Admitting: Internal Medicine

## 2012-08-18 NOTE — Evaluation (Signed)
Occupational Therapy Re-Evaluation  Patient Details  Name: Duane [name] MRN: [id number] Date of Birth: 16-Sep-1960  Today's Date: 08/18/2012 Time: 1478-2956 OT Time Calculation (min): 43 min MFR 1347-1400 13' Reassess 1400-1410 10'  Therex 1410-1430 20'  Visit#: 6 of 24  Re-eval: 09/15/12  Assessment Diagnosis: S/P SLAP Repair of Left Shoulder  Authorization: Medicare  Authorization Time Period: before 10th visit  Authorization Visit#: 6 of 10   Past Medical History:  Past Medical History  Diagnosis Date  . Gout   . Hypertension    Past Surgical History:  Past Surgical History  Procedure Laterality Date  . Neck exploration    . Shoulder arthrotomy      multiple   . Foot fasciotomy      X 5  . Hemorrhoid surgery      X2, Dr. Lovell Sheehan    Subjective Symptoms/Limitations Symptoms: S: My arm is little sore. I'm still having trouble with this motion (external rotation). Pain Assessment Currently in Pain?: Yes Pain Score: 3  Pain Location: Shoulder Pain Orientation: Left Pain Type: Acute pain  Precautions/Restrictions  Precautions Precautions: Shoulder Type of Shoulder Precautions: limitations through7/31/14   Assessment Additional Assessments LUE AROM (degrees) LUE Overall AROM Comments: assessed supine then seated ((assessed supine at eval) Left Shoulder Flexion:  (170/165 (on eval: 170)) Left Shoulder ABduction:  (180/165 (on eval: 150)) Left Shoulder Internal Rotation:  (90/90 (same at eval)) Left Shoulder External Rotation:  (60/65 (on eval: 15)) LUE Strength LUE Overall Strength Comments: Strength not assessed at eval Left Shoulder Flexion: 5/5 Left Shoulder ABduction: 5/5 Left Shoulder Internal Rotation:  (4-/5) Left Shoulder External Rotation: 5/5 Palpation Palpation: moderated fascial restrictions in left upper arm and scapularis region     Exercise/Treatments Supine Protraction: PROM;10 reps Horizontal ABduction: PROM;10 reps External  Rotation: PROM;10 reps Internal Rotation: PROM;10 reps Flexion: PROM;10 reps ABduction: PROM;10 reps Seated Protraction: AROM;20 reps Horizontal ABduction: AROM;20 reps External Rotation: AROM;20 reps Internal Rotation: AROM;20 reps Flexion: AROM;20 reps Abduction: AROM;20 reps ROM / Strengthening / Isometric Strengthening UBE (Upper Arm Bike): 1.0 2' forward 2' reverse Wall Wash: 2' Thumb Tacks: d/c "W" Arms: 12X X to V Arms: 12X Proximal Shoulder Strengthening, Seated: 20X Ball on Wall: green 1' flexion 1' Abduction Prot/Ret//Elev/Dep: d/c       Manual Therapy Manual Therapy: Myofascial release Myofascial Release: Myofascial release and manual stretching to left upper arm, scapular, and shoulder region to decrease pain and fascial restrictions and increase pain free mobility  Occupational Therapy Assessment and Plan OT Assessment and Plan Clinical Impression Statement: A: reassessment completed this date for MD appt. on 08/21/12. Patient making great progress towards goals. Cont. therapy 2x week and reassess in 4 weeks. See MD note for progress. Added UBE bike, W arms, X to V arms, and ball on the wall. Patient tolerated well.  OT Plan: P; Attempt 1# hand weight to supine and seated exercises.    Goals Short Term Goals Time to Complete Short Term Goals: 4 weeks Short Term Goal 1: Patient will be educated on HEP. Short Term Goal 1 Progress: Met Short Term Goal 2: Patient will increase left external rotation PROM to [facility] for increased ability to reach behind his head. Short Term Goal 2 Progress: Met Short Term Goal 3: Patient will increase left shoulder strength to 4/5 for increased ability to lift bags of dog food. Short Term Goal 3 Progress: Met Short Term Goal 4: Patient will decrease pain in his left shoulder to 3/10 when completing  daily activities.  Short Term Goal 4 Progress: Met Short Term Goal 5: Patient will decrease fascial restrictions to moderate in his left  shoulder region.  Short Term Goal 5 Progress: Met Long Term Goals Time to Complete Long Term Goals: 8 weeks Long Term Goal 1: Patient will return to prior level of independence will all B/IADLs, work, and leisure activities.  Long Term Goal 1 Progress: Progressing toward goal Long Term Goal 2: Patient will increase left shoulder AROM to WNL for increased ability to reach overhead and behind back. Long Term Goal 2 Progress: Met Long Term Goal 3: Patient will increase left shoulder strength to 5/5 for increased ability to complete all necessary job duties to return to work.  Long Term Goal 3 Progress: Progressing toward goal Long Term Goal 4: Patient will decrease pain in his left shoulder to 1/10 when completing daily activities.  Long Term Goal 4 Progress: Progressing toward goal Long Term Goal 5: Patient will decrease fascial restrictions to minimal in his left shoulder region.  Long Term Goal 5 Progress: Progressing toward goal  Problem List Patient Active Problem List   Diagnosis Date Noted  . SLAP lesion of shoulder 08/04/2012  . Muscle weakness (generalized) 08/04/2012  . Chronic pain 08/31/2011  . Deformity, acquired 08/31/2011  . Hypertension 08/31/2011  . Insomnia 08/31/2011  . Epigastric discomfort 05/03/2011  . LUMBAR SPRAIN AND STRAIN 10/22/2009  . CUBITAL TUNNEL SYNDROME 10/24/2007  . SHOULDER PAIN 10/24/2007  . 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 ELBOW PAIN 05/25/2007    End of Session Activity Tolerance: Patient tolerated treatment well General Behavior During Therapy: [facility] for tasks assessed/performed   [name], OTR/L,CBIS   08/18/2012, 2:34 PM  Physician Documentation Your signature is required to indicate approval of the treatment plan as stated above.  Please sign and either send electronically or make a copy of this report for your files and return this physician signed original.  Please mark one 1.__approve of plan  2. ___approve of plan with  the following conditions.   ______________________________                                                          _____________________ Physician Signature                                                                                                             Date

## 2012-08-23 ENCOUNTER — Ambulatory Visit (HOSPITAL_COMMUNITY)
Admission: RE | Admit: 2012-08-23 | Discharge: 2012-08-23 | Disposition: A | Discharge status: Home / Self Care | Payer: Medicare Other | Source: Ambulatory Visit | Attending: Internal Medicine | Admitting: Internal Medicine

## 2012-08-23 DIAGNOSIS — S43432D Superior glenoid labrum lesion of left shoulder, subsequent encounter: Secondary | ICD-10-CM

## 2012-08-23 DIAGNOSIS — M6281 Muscle weakness (generalized): Secondary | ICD-10-CM

## 2012-08-23 NOTE — Progress Notes (Signed)
Occupational Therapy Treatment Patient Details  Name: Duane [name] MRN: [id number] Date of Birth: 17-Dec-1960  Today's Date: 08/23/2012 Time: 9528-4132 OT Time Calculation (min): 43 min Manual Therapy 4401-0272 18' Therapeutic Exercises [phone] 25' Visit#: 7 of 24  Re-eval: 09/15/12    Authorization: Medicare  Authorization Time Period: before 10th visit  Authorization Visit#: 7 of 10  Subjective S:  Its a little sore, about a 3. Limitations: Protocol:  6 weeks (through 08/10/12) ER 30, ABd 60, flexion 90, no resisted protraction.  08/11/12 and beyond, progress as tolerated (PROTOCOL SCANNED).  Please note due to limited use of RUE, [name] was beyond these limitations at initial evaluation. AVOID combined ER/ABD Pain Assessment Currently in Pain?: Yes Pain Score: 3  Pain Location: Shoulder Pain Orientation: Left Pain Type: Acute pain  Precautions/Restrictions     08/11/12 and beyond, progress as tolerated (PROTOCOL SCANNED).  Please note due to limited use of RUE, [name] was beyond these limitations at initial evaluation. AVOID combined ER/ABD   Exercise/Treatments Supine Protraction: PROM;5 reps;AROM;12 reps Horizontal ABduction: PROM;5 reps;AROM;12 reps External Rotation: PROM;5 reps;AROM;12 reps Internal Rotation: PROM;5 reps;AROM;12 reps Flexion: PROM;5 reps;AROM;12 reps ABduction: PROM;5 reps;AROM;12 reps Seated Protraction: AROM;15 reps Horizontal ABduction: AROM;15 reps External Rotation: AROM;15 reps Internal Rotation: AROM;15 reps Flexion: AROM;15 reps Abduction: AROM;15 reps Standing Extension: Theraband;15 reps Theraband Level (Shoulder Extension): Level 3 (Green) Row: Theraband;15 reps Theraband Level (Shoulder Row): Level 3 (Green) Retraction: Theraband;15 reps Theraband Level (Shoulder Retraction): Level 3 (Green) ROM / Strengthening / Isometric Strengthening UBE (Upper Arm Bike): 1.5 intensity 3' and 3' Wall Wash: 3' "W" Arms: 15X X to V Arms:  15X  Proximal Shoulder Strengthening, Seated: 10X each range without resting  Ball on Wall: green 1' flexion 1' Abduction      Manual Therapy Manual Therapy: Myofascial release Myofascial Release: Myofascial release and manual stretching to left upper arm, scapular, and shoulder region to decrease pain and fascial restrictions and increase pain free mobility  Occupational Therapy Assessment and Plan OT Assessment and Plan Clinical Impression Statement: A:  External rotation is most limited shoulder mobility, and it is improving.   OT Plan: P:  Follow up on external rotation stretches, focus on building proximal shoulder stability prior to beginning free weight strengthening of shoulder.    Goals Short Term Goals Time to Complete Short Term Goals: 4 weeks Short Term Goal 1: Patient will be educated on HEP. Short Term Goal 2: Patient will increase left external rotation PROM to [facility] for increased ability to reach behind his head. Short Term Goal 3: Patient will increase left shoulder strength to 4/5 for increased ability to lift bags of dog food. Short Term Goal 4: Patient will decrease pain in his left shoulder to 3/10 when completing daily activities.  Short Term Goal 5: Patient will decrease fascial restrictions to moderate in his left shoulder region.  Long Term Goals Time to Complete Long Term Goals: 8 weeks Long Term Goal 1: Patient will return to prior level of independence will all B/IADLs, work, and leisure activities.  Long Term Goal 1 Progress: Progressing toward goal Long Term Goal 2: Patient will increase left shoulder AROM to WNL for increased ability to reach overhead and behind back. Long Term Goal 2 Progress: Progressing toward goal Long Term Goal 3: Patient will increase left shoulder strength to 5/5 for increased ability to complete all necessary job duties to return to work.  Long Term Goal 3 Progress: Progressing toward goal Long Term Goal  4: Patient will decrease pain  in his left shoulder to 1/10 when completing daily activities.  Long Term Goal 4 Progress: Progressing toward goal Long Term Goal 5: Patient will decrease fascial restrictions to minimal in his left shoulder region.  Long Term Goal 5 Progress: Progressing toward goal  Problem List Patient Active Problem List   Diagnosis Date Noted  . SLAP lesion of shoulder 08/04/2012  . Muscle weakness (generalized) 08/04/2012  . Chronic pain 08/31/2011  . Deformity, acquired 08/31/2011  . Hypertension 08/31/2011  . Insomnia 08/31/2011  . Epigastric discomfort 05/03/2011  . LUMBAR SPRAIN AND STRAIN 10/22/2009  . CUBITAL TUNNEL SYNDROME 10/24/2007  . SHOULDER PAIN 10/24/2007  . 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 ELBOW PAIN 05/25/2007    End of Session Activity Tolerance: Patient tolerated treatment well General Behavior During Therapy: WFL for tasks assessed/performed OT Plan of Care OT Home Exercise Plan: discussed ER stretch in supine and lying on left side, allowing left forearm to stretch into external rotation, avoiding combining abd and external rotation   GO    Shirlean Mylar, OTR/L  08/23/2012, 3:15 PM

## 2012-08-24 ENCOUNTER — Ambulatory Visit (HOSPITAL_COMMUNITY)
Admission: RE | Admit: 2012-08-24 | Discharge: 2012-08-24 | Disposition: A | Discharge status: Home / Self Care | Payer: Medicare Other | Source: Ambulatory Visit | Attending: Internal Medicine | Admitting: Internal Medicine

## 2012-08-24 DIAGNOSIS — M6281 Muscle weakness (generalized): Secondary | ICD-10-CM

## 2012-08-24 NOTE — Progress Notes (Signed)
Occupational Therapy Treatment Patient Details  Name: Duane [name] MRN: [id number] Date of Birth: [birth year]  Today's Date: 08/24/2012 Time: 5284-1324 OT Time Calculation (min): 48 min MFR 1420-1431 11' Therex 4010-2725 37'  Visit#: 8 of    Re-eval: 09/15/12    Authorization: Medicare  Authorization Time Period: before 10th visit  Authorization Visit#: 8 of 10  Subjective Symptoms/Limitations Symptoms: S: No pain today. I had some muscle soreness after the session last time.  Pain Assessment Currently in Pain?: No/denies  Precautions/Restrictions  Precautions Precautions: Shoulder Type of Shoulder Precautions: limitations through7/31/14  Exercise/Treatments  08/24/12 1400  Shoulder Exercises: Supine  Protraction PROM;5 reps;AROM;15 reps  Horizontal ABduction PROM;5 reps;AROM;15 reps  External Rotation PROM;5 reps;AROM;15 reps  Internal Rotation PROM;5 reps;AROM;15 reps  Flexion PROM;5 reps;AROM;15 reps  ABduction PROM;5 reps;AROM;15 reps  Shoulder Exercises: Standing  Extension Theraband;15 reps  Theraband Level (Shoulder Extension) Level 3 (Green)  Row [employer] reps  Theraband Level (Shoulder Row) Level 3 (Green)  Retraction Theraband;15 reps  Theraband Level (Shoulder Retraction) Level 3 (Green)  Shoulder Exercises: ROM/Strengthening  UBE (Upper Arm Bike) 1.5 intensity 3' and 3'  Wall Wash 3'  "W" Arms 15X  X to V Arms 15X   Proximal Shoulder Strengthening, Supine 15X  Proximal Shoulder Strengthening, Seated 20X  Ball on Wall green 1' flexion 1' Abduction      Manual Therapy Manual Therapy: Myofascial release Myofascial Release: Myofascial release and manual stretching to left upper arm, scapular, and shoulder region to decrease pain and fascial restrictions and increase pain free mobility  Occupational Therapy Assessment and Plan OT Assessment and Plan Clinical Impression Statement: A; Slight muscle soreness during exercises. Patient completing  external rotation stretches at home. No questions regarding stretching.   OT Plan: P: Cont. to focus on building proximal shoulder stability prior to beginning free weight strengthening of shoulder.    Goals Short Term Goals Time to Complete Short Term Goals: 4 weeks Short Term Goal 1: Patient will be educated on HEP. Short Term Goal 2: Patient will increase left external rotation PROM to [facility] for increased ability to reach behind his head. Short Term Goal 3: Patient will increase left shoulder strength to 4/5 for increased ability to lift bags of dog food. Short Term Goal 4: Patient will decrease pain in his left shoulder to 3/10 when completing daily activities.  Short Term Goal 5: Patient will decrease fascial restrictions to moderate in his left shoulder region.  Long Term Goals Time to Complete Long Term Goals: 8 weeks Long Term Goal 1: Patient will return to prior level of independence will all B/IADLs, work, and leisure activities.  Long Term Goal 2: Patient will increase left shoulder AROM to WNL for increased ability to reach overhead and behind back. Long Term Goal 3: Patient will increase left shoulder strength to 5/5 for increased ability to complete all necessary job duties to return to work.  Long Term Goal 4: Patient will decrease pain in his left shoulder to 1/10 when completing daily activities.  Long Term Goal 5: Patient will decrease fascial restrictions to minimal in his left shoulder region.   Problem List Patient Active Problem List   Diagnosis Date Noted  . SLAP lesion of shoulder 08/04/2012  . Muscle weakness (generalized) 08/04/2012  . Chronic pain 08/31/2011  . Deformity, acquired 08/31/2011  . Hypertension 08/31/2011  . Insomnia 08/31/2011  . Epigastric discomfort 05/03/2011  . LUMBAR SPRAIN AND STRAIN 10/22/2009  . CUBITAL TUNNEL SYNDROME 10/24/2007  . SHOULDER  PAIN 10/24/2007  . 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 ELBOW PAIN 05/25/2007    End of  Session Activity Tolerance: Patient tolerated treatment well General Behavior During Therapy: [facility] for tasks assessed/performed   [name], OTR/L,CBIS   08/24/2012, 3:09 PM

## 2012-08-29 ENCOUNTER — Ambulatory Visit (HOSPITAL_COMMUNITY)
Admission: RE | Admit: 2012-08-29 | Discharge: 2012-08-29 | Disposition: A | Discharge status: Home / Self Care | Payer: Medicare Other | Source: Ambulatory Visit | Attending: Internal Medicine | Admitting: Internal Medicine

## 2012-08-29 DIAGNOSIS — M6281 Muscle weakness (generalized): Secondary | ICD-10-CM

## 2012-08-29 NOTE — Progress Notes (Signed)
Occupational Therapy Treatment Patient Details  Name: Duane [name] MRN: [id number] Date of Birth: 1960-11-22  Today's Date: 08/29/2012 Time: 1352-1440 OT Time Calculation (min): 48 min MFR 1352-1410 18' Therex 1410-1440 30'0  Visit#: 9 of 24  Re-eval: 09/15/12    Authorization: Medicare  Authorization Time Period: before 10th visit  Authorization Visit#: 9 of 10  Subjective Symptoms/Limitations Symptoms: S: Yesterday there was a point in the day where I felt like my shoulder had no strength. I couldn't even hold a cup of water.  Pain Assessment Currently in Pain?: No/denies  Precautions/Restrictions  Precautions Precautions: Shoulder Type of Shoulder Precautions: limitations through7/31/14  Exercise/Treatments Supine Protraction: PROM;5 reps;AROM;15 reps Horizontal ABduction: PROM;5 reps;AROM;15 reps External Rotation: PROM;5 reps;AROM;15 reps Internal Rotation: PROM;5 reps;AROM;15 reps Flexion: PROM;5 reps;AROM;15 reps ABduction: PROM;5 reps;AROM;15 reps Seated Protraction: AROM;15 reps Horizontal ABduction: AROM;15 reps External Rotation: AROM;15 reps Internal Rotation: AROM;15 reps Flexion: AROM;15 reps Abduction: AROM;15 reps ROM / Strengthening / Isometric Strengthening UBE (Upper Arm Bike): 1.5 intensity 3' and 3' Wall Wash: 3' "W" Arms: 20X X to V Arms: 20X Proximal Shoulder Strengthening, Supine: 15X Proximal Shoulder Strengthening, Seated: 20X Ball on Wall: green 1' flexion 1' Abduction         Manual Therapy Manual Therapy: Myofascial release Myofascial Release: Myofascial release and manual stretching to left upper arm, scapular, and shoulder region to decrease pain and fascial restrictions and increase pain free mobility.  Occupational Therapy Assessment and Plan OT Assessment and Plan Clinical Impression Statement: A: Continues to have increased muscle fatigue and soreness during scapular stability exercises. OT Plan: P: Attempt 1# weight  with supine exercises. Attempt wrist weight during wall wash.    Goals Short Term Goals Time to Complete Short Term Goals: 4 weeks Short Term Goal 1: Patient will be educated on HEP. Short Term Goal 2: Patient will increase left external rotation PROM to [facility] for increased ability to reach behind his head. Short Term Goal 3: Patient will increase left shoulder strength to 4/5 for increased ability to lift bags of dog food. Short Term Goal 4: Patient will decrease pain in his left shoulder to 3/10 when completing daily activities.  Short Term Goal 5: Patient will decrease fascial restrictions to moderate in his left shoulder region.  Long Term Goals Time to Complete Long Term Goals: 8 weeks Long Term Goal 1: Patient will return to prior level of independence will all B/IADLs, work, and leisure activities.  Long Term Goal 1 Progress: Progressing toward goal Long Term Goal 2: Patient will increase left shoulder AROM to WNL for increased ability to reach overhead and behind back. Long Term Goal 2 Progress: Progressing toward goal Long Term Goal 3: Patient will increase left shoulder strength to 5/5 for increased ability to complete all necessary job duties to return to work.  Long Term Goal 3 Progress: Progressing toward goal Long Term Goal 4: Patient will decrease pain in his left shoulder to 1/10 when completing daily activities.  Long Term Goal 4 Progress: Progressing toward goal Long Term Goal 5: Patient will decrease fascial restrictions to minimal in his left shoulder region.  Long Term Goal 5 Progress: Progressing toward goal  Problem List Patient Active Problem List   Diagnosis Date Noted  . SLAP lesion of shoulder 08/04/2012  . Muscle weakness (generalized) 08/04/2012  . Chronic pain 08/31/2011  . Deformity, acquired 08/31/2011  . Hypertension 08/31/2011  . Insomnia 08/31/2011  . Epigastric discomfort 05/03/2011  . LUMBAR SPRAIN AND STRAIN 10/22/2009  .  CUBITAL TUNNEL SYNDROME  10/24/2007  . SHOULDER PAIN 10/24/2007  . 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 ELBOW PAIN 05/25/2007    End of Session Activity Tolerance: Patient tolerated treatment well General Behavior During Therapy: [facility] for tasks assessed/performed   [name], OTR/L,CBIS   08/29/2012, 2:52 PM

## 2012-08-30 ENCOUNTER — Ambulatory Visit (HOSPITAL_COMMUNITY): Payer: Medicare Other

## 2012-09-05 ENCOUNTER — Inpatient Hospital Stay (HOSPITAL_COMMUNITY): Admission: RE | Admit: 2012-09-05 | Payer: Medicare Other | Source: Ambulatory Visit | Admitting: Specialist

## 2013-01-16 ENCOUNTER — Encounter: Payer: Self-pay | Admitting: Orthopedic Surgery

## 2013-01-16 ENCOUNTER — Ambulatory Visit: Payer: Medicare Other | Admitting: Orthopedic Surgery

## 2013-01-17 ENCOUNTER — Telehealth: Payer: Self-pay | Admitting: Orthopedic Surgery

## 2013-01-17 NOTE — Telephone Encounter (Signed)
Patient called regarding appointment 01/16/13, which he had thought was tomorrow, 01/18/13; states he is cancelling, and that the elbow problem "went away on its own", therefore, does not need to re-schedule at this time.

## 2014-08-19 ENCOUNTER — Encounter: Payer: Self-pay | Admitting: Podiatry

## 2014-08-19 ENCOUNTER — Ambulatory Visit (INDEPENDENT_AMBULATORY_CARE_PROVIDER_SITE_OTHER): Payer: Medicare Other

## 2014-08-19 ENCOUNTER — Ambulatory Visit (INDEPENDENT_AMBULATORY_CARE_PROVIDER_SITE_OTHER): Payer: Medicare Other | Admitting: Podiatry

## 2014-08-19 VITALS — BP 121/77 | HR 84 | Resp 12 | Ht 69.0 in | Wt 152.0 lb

## 2014-08-19 DIAGNOSIS — D3610 Benign neoplasm of peripheral nerves and autonomic nervous system, unspecified: Secondary | ICD-10-CM

## 2014-08-19 DIAGNOSIS — M779 Enthesopathy, unspecified: Secondary | ICD-10-CM | POA: Diagnosis not present

## 2014-08-19 MED ORDER — DICLOFENAC SODIUM 75 MG PO TBEC: 75.0000 mg | DELAYED_RELEASE_TABLET | Freq: Two times a day (BID) | ORAL | Status: DC

## 2014-08-19 NOTE — Progress Notes (Signed)
   Subjective:    Patient ID: Duane [name], male    DOB: 04-20-1960, 54 y.o.   MRN: [id number]  HPI    Review of Systems  All other systems reviewed and are negative.      Objective:   Physical Exam        Assessment & Plan:

## 2014-08-21 NOTE — Progress Notes (Signed)
Subjective:     Patient ID: Duane [name], male   DOB: [birth year], 54 y.o.   MRN: [id number]  HPI patient states my left second toe has been bothering me and I get occasional sharp pain and while it seems a little bit better now it still can get sore   Review of Systems  All other systems reviewed and are negative.      Objective:   Physical Exam  Constitutional: He is oriented to person, place, and time.  Cardiovascular: Intact distal pulses.   Musculoskeletal: Normal range of motion.  Neurological: He is oriented to person, place, and time.  Skin: Skin is warm.  Nursing note and vitals reviewed.  neurovascular status intact muscle strength adequate range of motion within normal limits with inflammation and pain around the second MPJ of the left foot with difficulty walking at times. Minimal rotation of the toes noted currently and patient's found to have good digital perfusion and is well oriented 3     Assessment:     Inflammatory capsulitis second MPJ of the left foot with mild dislocation of the second toe    Plan:     Reviewed the progress that the patient's making and I have recommended padding to try to take pressure off the joint surface. Patient wants to go this route and today the patient is placed in a metatarsal pad to reduce stress against the joint and was instructed on reduced activity. Reappoint to recheck

## 2014-10-16 ENCOUNTER — Ambulatory Visit: Payer: Medicare Other | Admitting: "Endocrinology

## 2014-11-29 ENCOUNTER — Telehealth: Payer: Self-pay | Admitting: *Deleted

## 2014-11-29 MED ORDER — COLCHICINE 0.6 MG PO TABS: ORAL_TABLET | ORAL | Status: DC

## 2014-11-29 NOTE — Telephone Encounter (Addendum)
Pt states woke this am with gout, and Dr. Paulla Dolly said call if had a problem.  Dr. Paulla Dolly ordered Colcrys0.6mg  #12 take 1 tablet tid x2 days and 1 tablet bid x2 days, then 1 tablet x2 days, and make an appt to be seen next week.  Orders to pt and transferred to schedulers.  Pt states Dr. Paulla Dolly said if the gout came back after his last appt Monday, he would call in a generic medication. Pt states he's starting to hurt again.  Pt called left message with name, DOB and phone.  I left message telling pt I had given his message to Dr. Paulla Dolly and had not heard back.  Dr. Mellody Drown orders called to pt and pharmacy.

## 2014-12-02 ENCOUNTER — Ambulatory Visit (INDEPENDENT_AMBULATORY_CARE_PROVIDER_SITE_OTHER): Payer: Medicare Other

## 2014-12-02 ENCOUNTER — Encounter: Payer: Self-pay | Admitting: Podiatry

## 2014-12-02 ENCOUNTER — Ambulatory Visit (INDEPENDENT_AMBULATORY_CARE_PROVIDER_SITE_OTHER): Payer: Medicare Other | Admitting: Podiatry

## 2014-12-02 VITALS — BP 134/82 | HR 93 | Resp 16

## 2014-12-02 DIAGNOSIS — M779 Enthesopathy, unspecified: Secondary | ICD-10-CM | POA: Diagnosis not present

## 2014-12-02 DIAGNOSIS — M79674 Pain in right toe(s): Secondary | ICD-10-CM

## 2014-12-02 DIAGNOSIS — M1000 Idiopathic gout, unspecified site: Secondary | ICD-10-CM | POA: Diagnosis not present

## 2014-12-02 MED ORDER — TRIAMCINOLONE ACETONIDE 10 MG/ML IJ SUSP
10.0000 mg | Freq: Once | INTRAMUSCULAR | Status: AC
  Administered 2014-12-02: 10 mg

## 2014-12-02 NOTE — Progress Notes (Signed)
Subjective:     Patient ID: Duane [name], male   DOB: October 01, 1960, 54 y.o.   MRN: [id number]  HPI patient states that had a lot of pain in my big toe joint right foot and I have a lot of redness and nail and I have had a history of gout   Review of Systems     Objective:   Physical Exam Neurovascular status intact muscle strength was adequate with inflammation around the first MPJ right that's localized in nature with with no pathology currently on the left foot    Assessment:     Inflammatory capsulitis with moderate hallux limitus deformity right along with possibility for gout of the right first MPJ    Plan:     H&P and all conditions discussed with patient. Due to the discomfort I did inject around the first MPJ right 3 mg Kenalog 5 mg Xylocaine and then went ahead and discussed gout and that we are to start him on allopurinol symptoms persist and educated him on this medication

## 2014-12-10 MED ORDER — METHYLPREDNISOLONE 4 MG PO TBPK: ORAL_TABLET | ORAL | Status: DC

## 2014-12-10 NOTE — Telephone Encounter (Signed)
Can place him on a 6 day dose pak

## 2014-12-19 ENCOUNTER — Telehealth: Payer: Self-pay | Admitting: *Deleted

## 2014-12-19 NOTE — Telephone Encounter (Addendum)
Pt presents to office with Christmas tidings for Dr. Paulla Dolly and the staff.  Pt request medication to have on hand if gout flared again over the holidays. Pt states he has taken the steroid dose pack, but his pharmacy closes over the holiday and he would like to have something on hand.  Pt states he has used the Indomethacin early in a gout flare and it helps drastically.  Dr. Paulla Dolly ordered Indomethacin 50mg  #60 one tablet bid +2refills.  Unable to contact pt by phone, I called [facility] and asked if pt had an account there, the pharmacist stated yes.

## 2014-12-20 MED ORDER — INDOMETHACIN 50 MG PO CAPS: 50.0000 mg | ORAL_CAPSULE | Freq: Two times a day (BID) | ORAL | Status: DC

## 2015-07-03 ENCOUNTER — Encounter: Payer: Medicare Other | Admitting: Podiatry

## 2015-07-03 ENCOUNTER — Ambulatory Visit: Payer: Medicare Other

## 2015-07-03 DIAGNOSIS — M79672 Pain in left foot: Secondary | ICD-10-CM

## 2015-09-11 NOTE — Progress Notes (Signed)
This encounter was created in error - please disregard.

## 2015-10-29 ENCOUNTER — Ambulatory Visit: Payer: Medicare Other

## 2015-10-30 ENCOUNTER — Ambulatory Visit (INDEPENDENT_AMBULATORY_CARE_PROVIDER_SITE_OTHER): Payer: Medicare Other | Admitting: Family Medicine

## 2015-10-30 ENCOUNTER — Telehealth: Payer: Self-pay

## 2015-10-30 VITALS — BP 122/70 | HR 94 | Temp 98.5°F | Resp 16 | Ht 69.5 in | Wt 151.0 lb

## 2015-10-30 DIAGNOSIS — G8929 Other chronic pain: Secondary | ICD-10-CM | POA: Diagnosis not present

## 2015-10-30 DIAGNOSIS — M545 Low back pain: Secondary | ICD-10-CM

## 2015-10-30 MED ORDER — HYDROMORPHONE HCL 4 MG PO TABS: 4.0000 mg | ORAL_TABLET | ORAL | 0 refills | Status: DC | PRN

## 2015-10-30 MED ORDER — ALPRAZOLAM 1 MG PO TABS: ORAL_TABLET | ORAL | 1 refills | Status: DC

## 2015-10-30 NOTE — Patient Instructions (Addendum)
  It was good to meet you today.  We are going to refer you to pain management. They will take over her pain medications.  Refill for xanax at #90 pills.  If he had any other issues you can come back to see [place].  I have refilled your pain medications for future date until you are seen at pain management.   IF you received an x-ray today, you will receive an invoice from [facility]. Please contact [facility] at [phone] with questions or concerns regarding your invoice.   IF you received labwork today, you will receive an invoice from [employer]. Please contact [facility] at [phone] with questions or concerns regarding your invoice.   Our billing staff will not be able to assist you with questions regarding bills from these companies.  You will be contacted with the lab results as soon as they are available. The fastest way to get your results is to activate your My Chart account. Instructions are located on the last page of this paperwork. If you have not heard from [place] regarding the results in 2 weeks, please contact this office.
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
## 2015-10-30 NOTE — Telephone Encounter (Signed)
Pt saw Dr Carlota Raspberry today, who is going to place a pain management referral. Dr Carlota Raspberry had some recommendations of clinics for him to go to. Has an apt with [facility] that was set up through previous dr. Would like to know if Dr Carlota Raspberry "approves" of this provider/office.

## 2015-11-03 ENCOUNTER — Other Ambulatory Visit (HOSPITAL_COMMUNITY): Payer: Self-pay | Admitting: Anesthesiology

## 2015-11-03 DIAGNOSIS — G8929 Other chronic pain: Secondary | ICD-10-CM

## 2015-11-03 DIAGNOSIS — M545 Low back pain: Principal | ICD-10-CM

## 2015-11-03 NOTE — Telephone Encounter (Signed)
Dr. Carlota Raspberry,  Patient would like to know your recommendations on [facility]. He would like to know if you approve of him being seen there or do you suggest another office. Please advise.   Thanks

## 2015-11-03 NOTE — Telephone Encounter (Signed)
Patient seen by Dr. Mingo Amber, will route message to him.

## 2015-11-04 NOTE — Telephone Encounter (Signed)
[facility] sounds good.  I'm not sure if this is the one we referred him to, or if it was his former PCP who referred him there.  Let him know he should keep his appointment to be evaluated there.

## 2015-11-04 NOTE — Telephone Encounter (Signed)
Attempted to call pt. Voice mail left for pt to keep appointment with the [facility].

## 2015-11-05 ENCOUNTER — Ambulatory Visit (HOSPITAL_COMMUNITY)
Admission: RE | Admit: 2015-11-05 | Discharge: 2015-11-05 | Disposition: A | Discharge status: Home / Self Care | Payer: Medicare Other | Source: Ambulatory Visit | Attending: Anesthesiology | Admitting: Anesthesiology

## 2015-11-05 DIAGNOSIS — G8929 Other chronic pain: Secondary | ICD-10-CM

## 2015-11-05 DIAGNOSIS — M5126 Other intervertebral disc displacement, lumbar region: Secondary | ICD-10-CM | POA: Diagnosis not present

## 2015-11-05 DIAGNOSIS — M629 Disorder of muscle, unspecified: Secondary | ICD-10-CM | POA: Diagnosis present

## 2015-11-05 DIAGNOSIS — M545 Low back pain: Secondary | ICD-10-CM | POA: Diagnosis present

## 2015-11-06 ENCOUNTER — Ambulatory Visit (HOSPITAL_COMMUNITY): Payer: Medicare Other | Admitting: Physical Therapy

## 2015-11-06 DIAGNOSIS — R29898 Other symptoms and signs involving the musculoskeletal system: Secondary | ICD-10-CM

## 2015-11-06 DIAGNOSIS — R252 Cramp and spasm: Secondary | ICD-10-CM

## 2015-11-06 DIAGNOSIS — M6281 Muscle weakness (generalized): Secondary | ICD-10-CM

## 2015-11-06 DIAGNOSIS — G8929 Other chronic pain: Secondary | ICD-10-CM

## 2015-11-06 DIAGNOSIS — M5442 Lumbago with sciatica, left side: Principal | ICD-10-CM

## 2015-11-06 DIAGNOSIS — M5126 Other intervertebral disc displacement, lumbar region: Secondary | ICD-10-CM | POA: Diagnosis not present

## 2015-11-06 NOTE — Therapy (Signed)
[place], [place], [address] Phone: [phone]   Fax:  [phone]  Physical Therapy Evaluation  Patient Details  Name: Duane [name] MRN: [id number] Date of Birth: 1960/10/03 Referring Provider: Daisey Must   Encounter Date: 11/06/2015      PT End of Session - 11/06/15 1811    Visit Number 1   Number of Visits 12   Date for PT Re-Evaluation 11/27/15   Authorization Type Medicare    Authorization Time Period 11/06/15 to 12/18/15   Authorization - Visit Number 1   Authorization - Number of Visits 10   PT Start Time [id number]   PT Stop Time [id number]   PT Time Calculation (min) 49 min   Activity Tolerance Patient tolerated treatment well   Behavior During Therapy [facility] for tasks assessed/performed      Past Medical History:  Diagnosis Date  . Arthritis   . Gout   . Hypertension     Past Surgical History:  Procedure Laterality Date  . ELBOW SURGERY    . FOOT FASCIOTOMY     X 5  . HEMORRHOID SURGERY     X2, Dr. Arnoldo Morale  . NECK EXPLORATION    . SHOULDER ARTHROTOMY     multiple     There were no vitals filed for this visit.       Subjective Assessment - 11/06/15 1604    Subjective Patient has had chronic back pain; he states that he really hopes to aviod surgery as he is concerned about the recovery from it. He does have pain running down his L leg, he feels like he is subtly dragging his L LE when he is walking.  Patient is aware that activity is important in managing his condition. No falls recently.    How long can you sit comfortably? immediate discomfort    How long can you stand comfortably? 5 minutes max    How long can you walk comfortably? maybe a mile but he does have discomfort    Diagnostic tests MRI shows no specific/acute cause of back pain/radiculopathy    Patient Stated Goals reduce pain    Currently in Pain? Yes   Pain Score 3    Pain Location Other (Comment)  low back and L LE     Pain Orientation Left;Lower   Pain Descriptors / Indicators Sharp;Radiating;Aching;Dull   Pain Type Chronic pain   Pain Radiating Towards down the back of L LE    Pain Onset More than a month ago   Pain Frequency Constant   Aggravating Factors  bending    Pain Relieving Factors pain medicine    Effect of Pain on Daily Activities severe limitations on ADLs            [facility] PT Assessment - 11/06/15 0001      Assessment   Medical Diagnosis lumbago    Referring Provider Rip Harbour Gyarteng-Dakwa    Onset Date/Surgical Date --  chronic    Next MD Visit referring MD on November 3rd    Prior Therapy had PT for his back years ago      Precautions   Precaution Comments no limitations      Balance Screen   Has the patient fallen in the past 6 months No   Has the patient had a decrease in activity level because of a fear of falling?  No   Is the patient reluctant to leave their home because of a fear of  falling?  No     Prior Function   Level of Independence Independent;Independent with basic ADLs;Independent with gait;Independent with transfers   Vocation Part time employment     Observation/Other Assessments   Observations SLR, FABER (-)B; scour (-) L (+) mild R     AROM   Overall AROM Comments severe hip stiffness noted globally B with L being more impacted than the R    Lumbar Flexion approximately mid-tibia    Lumbar Extension moderate limitation    Lumbar - Right Side Bend moderate limitation    Lumbar - Left Side Bend modeate limitation    Lumbar - Right Rotation moderate limitation    Lumbar - Left Rotation moderate limitation      Strength   Right Hip Flexion 3/5   Right Hip Extension 3-/5   Right Hip ABduction 4+/5   Left Hip Flexion 3/5   Left Hip Extension 3-/5   Left Hip ABduction 3+/5   Right Knee Flexion 3+/5   Right Knee Extension 4/5   Left Knee Flexion 3/5   Left Knee Extension 4/5   Right Ankle Dorsiflexion 4/5   Left Ankle Dorsiflexion 4-/5      Palpation   Spinal mobility very hypomobile PAs lumbar and thoracic spines, sacrum    Palpation comment some knotting and tension noted bilateral paraspinals                            PT Education - 11/06/15 1809    Education provided Yes   Education Details prognosis, POC, HEP    Person(s) Educated Patient   Methods Explanation;Demonstration;Handout   Comprehension Verbalized understanding;Returned demonstration;Need further instruction          PT Short Term Goals - 11/06/15 1819      PT SHORT TERM GOAL #1   Title Patient to demonstrate only mild lumbar and thoracic ROM limitations in order to reduce pain and improve general mobility    Time 3   Period Weeks   Status New     PT SHORT TERM GOAL #2   Title Patient to demonstrate only moderate limitation in bilateral hip ROM on all planes in order to reduce pain and improve mechanics    Time 3   Period Weeks   Status New     PT SHORT TERM GOAL #3   Title Patient to experience pain no more than 2/10 in order to improve QOL and functional task tolerance    Time 3   Period Weeks   Status New     PT SHORT TERM GOAL #4   Title Patient to be compliant with correctly and consistently performing appropriate HEP, to be updated PRN    Time 1   Period Weeks   Status New           PT Long Term Goals - 11/06/15 1820      PT LONG TERM GOAL #1   Title Patient to demonstrate functional strength 5/5 in order to assist in reducing pain and improving general function    Time 6   Period Weeks   Status New     PT LONG TERM GOAL #2   Title Patient to demonstrate correct functional lifting mechanics in order to prevent exacerbation of pain with general activities    Time 6   Period Weeks   Status New     PT LONG TERM GOAL #3   Title Patient to be participatory  in regular physical activity program, at least 30 minutes in duration, at least 4 days per week, in order to maintain functional gains and improve  general health status    Time [place] - 11/06/15 1812    Clinical Impression Statement Patient arrives with chronic back pain; he reports that he is quite dependent on pain medicines to manage his pain and really would very much like to avoid surgery- he has done PT for his back in the past and is very aware of its benefits, specifically requesting HEP. Upon examination, patient reveals severe and significant stiffness in lumbar spine and bilateral hips, as well as general functional weakness and gait deviation likely related to reduced tissue/joint mobility. Patient will benefit from skilled PT services to address functional impairments and assist in reaching optimal level of function.    Rehab Potential Good   Clinical Impairments Affecting Rehab Potential chronicity of pain, chronic use of pharmaceuticals, excellent motivation to participate with skilled PT services and for compliance with HEP    PT Frequency 2x / week   PT Duration 6 weeks   PT Treatment/Interventions ADLs/Self Care Home Management;Biofeedback;Cryotherapy;Moist Heat;Gait training;Stair training;Functional mobility training;Therapeutic activities;Therapeutic exercise;Balance training;Neuromuscular re-education;Patient/family education;Manual techniques;Passive range of motion;Energy conservation;Dry needling;Taping   PT Next Visit Plan review HEP and goals: focus on lumbar and bilatearl hip mobility, check for more extensive soft tissue limitations in hips/back, proximal strength    PT Home Exercise Plan 10/26: SKTC, lumbar rotations, piriformis stretch       Patient will benefit from skilled therapeutic intervention in order to improve the following deficits and impairments:  Abnormal gait, Increased fascial restricitons, Improper body mechanics, Pain, Decreased mobility, Increased muscle spasms, Postural dysfunction, Decreased range of motion, Decreased strength, Hypomobility,  Decreased balance, Difficulty walking  Visit Diagnosis: Chronic bilateral low back pain with left-sided sciatica - Plan: PT plan of care cert/re-cert  Muscle weakness (generalized) - Plan: PT plan of care cert/re-cert  Cramp and spasm - Plan: PT plan of care cert/re-cert  Other symptoms and signs involving the musculoskeletal system - Plan: PT plan of care cert/re-cert      G-Codes - [id number]    Functional Assessment Tool Used Based on skilled clinical assessment based on ROM, strength, pain patterns, joint mobility of hips and spine    Functional Limitation Mobility: Walking and moving around   Mobility: Walking and Moving Around Current Status [phone]) At least 40 percent but less than 60 percent impaired, limited or restricted   Mobility: Walking and Moving Around Goal Status [phone]) At least 20 percent but less than 40 percent impaired, limited or restricted       Problem List Patient Active Problem List   Diagnosis Date Noted  . SLAP lesion of shoulder 08/04/2012  . Muscle weakness (generalized) 08/04/2012  . Chronic pain 08/31/2011  . Deformity, acquired 08/31/2011  . Hypertension 08/31/2011  . Insomnia 08/31/2011  . Epigastric discomfort 05/03/2011  . LUMBAR SPRAIN AND STRAIN 10/22/2009  . CUBITAL TUNNEL SYNDROME 10/24/2007  . SHOULDER PAIN 10/24/2007  . 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 ELBOW PAIN 05/25/2007    Deniece Ree PT, DPT [facility] [address] [place], [place], [address] Phone: [phone]   Fax:  [phone]  Name: Duane [name] MRN: [id number] Date of Birth: [birth year]

## 2015-11-06 NOTE — Patient Instructions (Signed)
   SINGLE KNEE TO CHEST STRETCH - [place]  While Lying on your back, hold your knee and gently pull it up towards your chest.  Hold for 10 seconds, then switch sides; repeat 5 times each side, 2-3 times per day.    Lumbar Rotations   Lying on your back with your knees bent, slowly drop your legs to one side and hold the stretch. Come back to the middle and switch sides. You should feel the stretch in your back on the opposite side that your legs are leaning.  Hold for 10 seconds, and repeat 5 times each side; repeat 2-3 times per day.      PIRIFORMIS AND HIP STRETCH - SEATED  While sitting in a chair, cross your affected leg on top of the other as shown.   Next, gently lean forward until a stretch is felt along the crossed leg. DO NOT HUNCH FORWARD LIKE THE FELLOW IN THE PICTURE IS DOING.  Hold for 30 seconds, 3 times each side, 2-3 times per day.

## 2015-11-11 ENCOUNTER — Ambulatory Visit (HOSPITAL_COMMUNITY): Payer: Medicare Other | Attending: Family Medicine | Admitting: Physical Therapy

## 2015-11-11 DIAGNOSIS — M6281 Muscle weakness (generalized): Secondary | ICD-10-CM | POA: Insufficient documentation

## 2015-11-11 DIAGNOSIS — G8929 Other chronic pain: Secondary | ICD-10-CM | POA: Diagnosis present

## 2015-11-11 DIAGNOSIS — M5442 Lumbago with sciatica, left side: Secondary | ICD-10-CM | POA: Insufficient documentation

## 2015-11-11 DIAGNOSIS — R252 Cramp and spasm: Secondary | ICD-10-CM | POA: Insufficient documentation

## 2015-11-11 DIAGNOSIS — R29898 Other symptoms and signs involving the musculoskeletal system: Secondary | ICD-10-CM | POA: Diagnosis present

## 2015-11-11 NOTE — Therapy (Signed)
[place], [place], [address] Phone: [phone]   Fax:  [phone]  Physical Therapy Treatment  Patient Details  Name: Duane [name] MRN: [id number] Date of Birth: 1960/10/10 Referring Provider: Daisey Must   Encounter Date: 11/11/2015      PT End of Session - 11/11/15 1727    Visit Number 2   Number of Visits 12   Date for PT Re-Evaluation 11/27/15   Authorization Type Medicare    Authorization Time Period 11/06/15 to 12/18/15   Authorization - Visit Number 2   Authorization - Number of Visits 10   PT Start Time [id number]   PT Stop Time 1725   PT Time Calculation (min) 40 min   Activity Tolerance Patient tolerated treatment well   Behavior During Therapy [facility] for tasks assessed/performed      Past Medical History:  Diagnosis Date  . Arthritis   . Gout   . Hypertension     Past Surgical History:  Procedure Laterality Date  . ELBOW SURGERY    . FOOT FASCIOTOMY     X 5  . HEMORRHOID SURGERY     X2, Dr. Arnoldo Morale  . NECK EXPLORATION    . SHOULDER ARTHROTOMY     multiple     There were no vitals filed for this visit.      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    Currently in Pain? Yes   Pain Score 4    Pain Location Back   Pain Orientation Lower;Left                         [facility] Adult PT Treatment/Exercise - 11/11/15 0001      Lumbar Exercises: Stretches   Active Hamstring Stretch 3 reps;30 seconds   Active Hamstring Stretch Limitations supine    Single Knee to Chest Stretch 3 reps;10 seconds   Lower Trunk Rotation 3 reps;10 seconds   Hip Flexor Stretch 2 reps;30 seconds   Quad Stretch 2 reps;30 seconds   Piriformis Stretch 3 reps;30 seconds     Lumbar Exercises: Supine   Dead Bug 10 reps   Dead Bug  Limitations with core set    Bridge 10 reps   Other Supine Lumbar Exercises supine hip ABD with red TB 1x10     Lumbar Exercises: Sidelying   Clam 10 reps   Clam Limitations green TB      Manual Therapy   Manual Therapy Joint mobilization;Soft tissue mobilization   Manual therapy comments performed separatley from all other skilled services    Joint Mobilization L5-approx T5 grade II-III    Soft tissue mobilization STM/trigger point release to bilateral paraspinals                 PT Education - 11/11/15 1727    Education provided Yes   Education Details reveiwed initial eval/goals    Person(s) Educated Patient   Methods Explanation;Handout   Comprehension Verbalized understanding          PT Short Term Goals - 11/06/15 1819      PT SHORT TERM GOAL #1   Title Patient to demonstrate only mild lumbar and thoracic ROM limitations in order to reduce pain and improve general mobility    Time 3   Period Weeks  Status New     PT SHORT TERM GOAL #2   Title Patient to demonstrate only moderate limitation in bilateral hip ROM on all planes in order to reduce pain and improve mechanics    Time 3   Period Weeks   Status New     PT SHORT TERM GOAL #3   Title Patient to experience pain no more than 2/10 in order to improve QOL and functional task tolerance    Time 3   Period Weeks   Status New     PT SHORT TERM GOAL #4   Title Patient to be compliant with correctly and consistently performing appropriate HEP, to be updated PRN    Time 1   Period Weeks   Status New           PT Long Term Goals - 11/06/15 1820      PT LONG TERM GOAL #1   Title Patient to demonstrate functional strength 5/5 in order to assist in reducing pain and improving general function    Time 6   Period Weeks   Status New     PT LONG TERM GOAL #2   Title Patient to demonstrate correct functional lifting mechanics in order to prevent exacerbation of pain with general activities    Time  6   Period Weeks   Status New     PT LONG TERM GOAL #3   Title Patient to be participatory in regular physical activity program, at least 30 minutes in duration, at least 4 days per week, in order to maintain functional gains and improve general health status    Time 6   Period Weeks   Status New               Plan - 11/11/15 1728    Clinical Impression Statement Patient arrives today in good spirits and ongoing interest in participating in skilled PT services. Began session with functional stretching for lumbar region and bilateral hips today, followed immediately by manual to lumbar and thoracic spines via grade II PA joint mobilizations L5-approximately T5 and STM to bilateral paraspinals, noting severe muscle spasm especially in L side of paraspinals which was moderate released with manual. Ended session with functional therapeutic exercise targeting proximal musculature due to significant weakness identified at eval.    Rehab Potential Good   Clinical Impairments Affecting Rehab Potential chronicity of pain, chronic use of pharmaceuticals, excellent motivation to participate with skilled PT services and for compliance with HEP    PT Frequency 2x / week   PT Duration 6 weeks   PT Treatment/Interventions ADLs/Self Care Home Management;Biofeedback;Cryotherapy;Moist Heat;Gait training;Stair training;Functional mobility training;Therapeutic activities;Therapeutic exercise;Balance training;Neuromuscular re-education;Patient/family education;Manual techniques;Passive range of motion;Energy conservation;Dry needling;Taping   PT Next Visit Plan Add core activation exercises to HEP. focus on lumbar and bilatearl hip mobility, check for more extensive soft tissue limitations in hips/back, proximal strengthening, core activation    PT Home Exercise Plan 10/26: SKTC, lumbar rotations, piriformis stretch    Consulted and Agree with Plan of Care Patient      Patient will benefit from skilled  therapeutic intervention in order to improve the following deficits and impairments:  Abnormal gait, Increased fascial restricitons, Improper body mechanics, Pain, Decreased mobility, Increased muscle spasms, Postural dysfunction, Decreased range of motion, Decreased strength, Hypomobility, Decreased balance, Difficulty walking  Visit Diagnosis: Chronic bilateral low back pain with left-sided sciatica  Muscle weakness (generalized)  Cramp and spasm  Other symptoms and signs involving the musculoskeletal system  Problem List Patient Active Problem List   Diagnosis Date Noted  . SLAP lesion of shoulder 08/04/2012  . Muscle weakness (generalized) 08/04/2012  . Chronic pain 08/31/2011  . Deformity, acquired 08/31/2011  . Hypertension 08/31/2011  . Insomnia 08/31/2011  . Epigastric discomfort 05/03/2011  . LUMBAR SPRAIN AND STRAIN 10/22/2009  . CUBITAL TUNNEL SYNDROME 10/24/2007  . SHOULDER PAIN 10/24/2007  . 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 ELBOW PAIN 05/25/2007    Deniece Ree PT, DPT [facility] [address] [place], [place], [address] Phone: [phone]   Fax:  [phone]  Name: Duane [name] MRN: [id number] Date of Birth: 1960/02/14

## 2015-11-13 ENCOUNTER — Ambulatory Visit (HOSPITAL_COMMUNITY): Payer: Medicare Other

## 2015-11-18 ENCOUNTER — Ambulatory Visit (HOSPITAL_COMMUNITY): Payer: Medicare Other | Attending: Family Medicine

## 2015-11-18 DIAGNOSIS — R29898 Other symptoms and signs involving the musculoskeletal system: Secondary | ICD-10-CM | POA: Insufficient documentation

## 2015-11-18 DIAGNOSIS — R252 Cramp and spasm: Secondary | ICD-10-CM

## 2015-11-18 DIAGNOSIS — M5442 Lumbago with sciatica, left side: Secondary | ICD-10-CM | POA: Insufficient documentation

## 2015-11-18 DIAGNOSIS — M6281 Muscle weakness (generalized): Secondary | ICD-10-CM | POA: Diagnosis present

## 2015-11-18 DIAGNOSIS — G8929 Other chronic pain: Secondary | ICD-10-CM | POA: Insufficient documentation

## 2015-11-18 NOTE — Therapy (Signed)
[place], [place], [address] Phone: [phone]   Fax:  [phone]  Physical Therapy Treatment  Patient Details  Name: Duane [name] MRN: [id number] Date of Birth: 1960/05/11 Referring Provider: Daisey Must   Encounter Date: 11/18/2015      PT End of Session - 11/18/15 1611    Visit Number 3   Number of Visits 12   Date for PT Re-Evaluation 11/27/15   Authorization Type Medicare    Authorization Time Period 11/06/15 to 12/18/15   Authorization - Visit Number 3   Authorization - Number of Visits 10   PT Start Time [id number]   PT Stop Time 1644   PT Time Calculation (min) 38 min   Activity Tolerance Patient tolerated treatment well   Behavior During Therapy [facility] for tasks assessed/performed      Past Medical History:  Diagnosis Date  . Arthritis   . Gout   . Hypertension     Past Surgical History:  Procedure Laterality Date  . ELBOW SURGERY    . FOOT FASCIOTOMY     X 5  . HEMORRHOID SURGERY     X2, Dr. Arnoldo Morale  . NECK EXPLORATION    . SHOULDER ARTHROTOMY     multiple     There were no vitals filed for this visit.      Subjective Assessment - 11/18/15 1609    Subjective Pt stated he is the "same" as last session, reports pain tolerable with pain medication about 2 hours prior session.     Patient Stated Goals reduce pain    Currently in Pain? Yes   Pain Score 3    Pain Location Back   Pain Orientation Lower;Left   Pain Descriptors / Indicators Constant;Pressure   Pain Type Chronic pain   Pain Radiating Towards no radicular symptoms this session   Pain Onset More than a month ago   Pain Frequency Constant   Aggravating Factors  bending   Pain Relieving Factors pain medicatin   Effect of Pain on Daily Activities severe limitations on ADLs             [facility] Adult PT Treatment/Exercise - 11/18/15 0001      Lumbar Exercises: Stretches   Active Hamstring Stretch 2 reps;30 seconds    Active Hamstring Stretch Limitations supine    Lower Trunk Rotation 5 reps;10 seconds     Lumbar Exercises: Supine   Dead Bug 10 reps   Dead Bug Limitations with core set    Bridge 20 reps   Bridge Limitations 3" holds   Straight Leg Raise 10 reps     Lumbar Exercises: Prone   Other Prone Lumbar Exercises heel squeeze 5x 5" holds     Manual Therapy   Manual Therapy Soft tissue mobilization   Manual therapy comments performed separatley from all other skilled services    Soft tissue mobilization STM/trigger point release to bilateral paraspinals and QL            PT Short Term Goals - 11/06/15 1819      PT SHORT TERM GOAL #1   Title Patient to demonstrate only mild lumbar and thoracic ROM limitations in order to reduce pain and improve general mobility    Time 3   Period Weeks   Status New     PT SHORT TERM GOAL #2   Title Patient to demonstrate only moderate limitation in bilateral hip ROM on all planes in order to  reduce pain and improve mechanics    Time 3   Period Weeks   Status New     PT SHORT TERM GOAL #3   Title Patient to experience pain no more than 2/10 in order to improve QOL and functional task tolerance    Time 3   Period Weeks   Status New     PT SHORT TERM GOAL #4   Title Patient to be compliant with correctly and consistently performing appropriate HEP, to be updated PRN    Time 1   Period Weeks   Status New           PT Long Term Goals - 11/06/15 1820      PT LONG TERM GOAL #1   Title Patient to demonstrate functional strength 5/5 in order to assist in reducing pain and improving general function    Time 6   Period Weeks   Status New     PT LONG TERM GOAL #2   Title Patient to demonstrate correct functional lifting mechanics in order to prevent exacerbation of pain with general activities    Time 6   Period Weeks   Status New     PT LONG TERM GOAL #3   Title Patient to be participatory in regular physical activity program, at  least 30 minutes in duration, at least 4 days per week, in order to maintain functional gains and improve general health status    Time 6   Period Weeks   Status New               Plan - 11/18/15 1741    Clinical Impression Statement Session focus on improving core and proximal musculature strengthening and lumbar mobility.  Added SLR and prone heel squeeze for proximal musculature strenghteing with min cueing for form and control with new exercises.  Ended session with manual soft tissue mobilization technqiues to reduce overall tightness and assist in reduction of Lt lumbar paraspinal spasm.  End of session pt reports pain reduced to 2/10.   Rehab Potential Good   Clinical Impairments Affecting Rehab Potential chronicity of pain, chronic use of pharmaceuticals, excellent motivation to participate with skilled PT services and for compliance with HEP    PT Frequency 2x / week   PT Duration 6 weeks   PT Treatment/Interventions ADLs/Self Care Home Management;Biofeedback;Cryotherapy;Moist Heat;Gait training;Stair training;Functional mobility training;Therapeutic activities;Therapeutic exercise;Balance training;Neuromuscular re-education;Patient/family education;Manual techniques;Passive range of motion;Energy conservation;Dry needling;Taping   PT Next Visit Plan Add core activation exercises to HEP. focus on lumbar and bilatearl hip mobility, check for more extensive soft tissue limitations in hips/back, proximal strengthening, core activation    PT Home Exercise Plan 10/26: SKTC, lumbar rotations, piriformis stretch       Patient will benefit from skilled therapeutic intervention in order to improve the following deficits and impairments:  Abnormal gait, Increased fascial restricitons, Improper body mechanics, Pain, Decreased mobility, Increased muscle spasms, Postural dysfunction, Decreased range of motion, Decreased strength, Hypomobility, Decreased balance, Difficulty walking  Visit  Diagnosis: Chronic bilateral low back pain with left-sided sciatica  Muscle weakness (generalized)  Cramp and spasm  Other symptoms and signs involving the musculoskeletal system     Problem List Patient Active Problem List   Diagnosis Date Noted  . SLAP lesion of shoulder 08/04/2012  . Muscle weakness (generalized) 08/04/2012  . Chronic pain 08/31/2011  . Deformity, acquired 08/31/2011  . Hypertension 08/31/2011  . Insomnia 08/31/2011  . Epigastric discomfort 05/03/2011  . LUMBAR SPRAIN AND STRAIN  10/22/2009  . CUBITAL TUNNEL SYNDROME 10/24/2007  . SHOULDER PAIN 10/24/2007  . 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 ELBOW PAIN 05/25/2007   Ihor Austin, [place]; [place]  Aldona Lento 11/18/2015, 5:46 PM  [place], [place], [address] Phone: [phone]   Fax:  [phone]  Name: Duane [name] MRN: [id number] Date of Birth: 1960/10/10

## 2015-11-21 ENCOUNTER — Ambulatory Visit (HOSPITAL_COMMUNITY): Payer: Medicare Other

## 2015-11-21 DIAGNOSIS — R29898 Other symptoms and signs involving the musculoskeletal system: Secondary | ICD-10-CM

## 2015-11-21 DIAGNOSIS — R252 Cramp and spasm: Secondary | ICD-10-CM

## 2015-11-21 DIAGNOSIS — M5442 Lumbago with sciatica, left side: Secondary | ICD-10-CM | POA: Diagnosis not present

## 2015-11-21 DIAGNOSIS — G8929 Other chronic pain: Secondary | ICD-10-CM

## 2015-11-21 DIAGNOSIS — M6281 Muscle weakness (generalized): Secondary | ICD-10-CM

## 2015-11-21 NOTE — Patient Instructions (Addendum)
High Stepping in Place (Sitting)    Sitting, alternately lift knees as high as possible. Keep torso erect. Repeat 10 times, each leg.  Copyright  VHI. All rights reserved.   Isometric Abdominal    Lying on back with knees bent, tighten stomach by pressing elbows down. Hold 5 seconds. Repeat 10 times per set with 5" holds.  http://orth.exer.us/1086   Copyright  VHI. All rights reserved.

## 2015-11-21 NOTE — Therapy (Signed)
[place], [place], [address] Phone: [phone]   Fax:  [phone]  Physical Therapy Treatment  Patient Details  Name: Duane [name] MRN: [id number] Date of Birth: 11-Mar-1960 Referring Provider: Daisey Must   Encounter Date: 11/21/2015      PT End of Session - 11/21/15 1513    Visit Number 4   Number of Visits 12   Date for PT Re-Evaluation 11/27/15   Authorization Type Medicare    Authorization Time Period 11/06/15 to 12/18/15   Authorization - Visit Number 4   Authorization - Number of Visits 10   PT Start Time [id number]   PT Stop Time 1556   PT Time Calculation (min) 49 min   Activity Tolerance Patient tolerated treatment well   Behavior During Therapy [facility] for tasks assessed/performed      Past Medical History:  Diagnosis Date  . Arthritis   . Gout   . Hypertension     Past Surgical History:  Procedure Laterality Date  . ELBOW SURGERY    . FOOT FASCIOTOMY     X 5  . HEMORRHOID SURGERY     X2, Dr. Arnoldo Morale  . NECK EXPLORATION    . SHOULDER ARTHROTOMY     multiple     There were no vitals filed for this visit.      Subjective Assessment - 11/21/15 1511    Subjective Pt stated Lt side of lower back pain scale 2/10 feels like increased pressure.     Patient Stated Goals reduce pain    Currently in Pain? Yes   Pain Score 2    Pain Location Back   Pain Orientation Lower;Left   Pain Descriptors / Indicators Constant;Pressure   Pain Type Chronic pain   Pain Radiating Towards no radicular symtoms    Pain Onset More than a month ago   Pain Frequency Constant   Aggravating Factors  bending   Pain Relieving Factors pain medication   Effect of Pain on Daily Activities severe limitation on ADLs              [facility] Adult PT Treatment/Exercise - 11/21/15 0001      Lumbar Exercises: Stretches   Piriformis Stretch 2 reps;30 seconds   Piriformis Stretch Limitations seated     Lumbar  Exercises: Seated   Hip Flexion on Ball 10 reps;Both   Hip Flexion on Ball Limitations sitting on dynadisk: dead bug (opp LE/UE); rotation with red ball     Lumbar Exercises: Supine   Ab Set 15 reps;5 seconds   Dead Bug 15 reps;3 seconds   Dead Bug Limitations with core set    Bridge 20 reps   Bridge Limitations 3" holds with ab set   Straight Leg Raise 10 reps   Straight Leg Raises Limitations floating    Large Ball Abdominal Isometric 10 reps;5 seconds   Large Ball Oblique Isometric 10 reps;5 seconds     Lumbar Exercises: Sidelying   Hip Abduction 10 reps     Lumbar Exercises: Prone   Straight Leg Raise 10 reps   Straight Leg Raises Limitations with core set   Other Prone Lumbar Exercises heel squeeze 10x 5" holds     Manual Therapy   Manual Therapy Soft tissue mobilization   Manual therapy comments performed separatley from all other skilled services    Soft tissue mobilization STM/trigger point release to bilateral paraspinals and QL  PT Short Term Goals - 11/06/15 1819      PT SHORT TERM GOAL #1   Title Patient to demonstrate only mild lumbar and thoracic ROM limitations in order to reduce pain and improve general mobility    Time 3   Period Weeks   Status New     PT SHORT TERM GOAL #2   Title Patient to demonstrate only moderate limitation in bilateral hip ROM on all planes in order to reduce pain and improve mechanics    Time 3   Period Weeks   Status New     PT SHORT TERM GOAL #3   Title Patient to experience pain no more than 2/10 in order to improve QOL and functional task tolerance    Time 3   Period Weeks   Status New     PT SHORT TERM GOAL #4   Title Patient to be compliant with correctly and consistently performing appropriate HEP, to be updated PRN    Time 1   Period Weeks   Status New           PT Long Term Goals - 11/06/15 1820      PT LONG TERM GOAL #1   Title Patient to demonstrate functional strength 5/5 in  order to assist in reducing pain and improving general function    Time 6   Period Weeks   Status New     PT LONG TERM GOAL #2   Title Patient to demonstrate correct functional lifting mechanics in order to prevent exacerbation of pain with general activities    Time 6   Period Weeks   Status New     PT LONG TERM GOAL #3   Title Patient to be participatory in regular physical activity program, at least 30 minutes in duration, at least 4 days per week, in order to maintain functional gains and improve general health status    Time 6   Period Weeks   Status New               Plan - 11/21/15 1520    Clinical Impression Statement Continued session focus on improving core and proximal muscualture strengthening.  Progressed core strengthening with theraball activities and gluteal strengthening with abduction and hip extension.  Pt given additional HEP to address core activation/strengthening.  Manual soft tissue mobilization addressing QL and lumbar paraspinals.  EOS pt. reports pain reduced.     Rehab Potential Good   Clinical Impairments Affecting Rehab Potential chronicity of pain, chronic use of pharmaceuticals, excellent motivation to participate with skilled PT services and for compliance with HEP    PT Frequency 2x / week   PT Duration 6 weeks   PT Treatment/Interventions ADLs/Self Care Home Management;Biofeedback;Cryotherapy;Moist Heat;Gait training;Stair training;Functional mobility training;Therapeutic activities;Therapeutic exercise;Balance training;Neuromuscular re-education;Patient/family education;Manual techniques;Passive range of motion;Energy conservation;Dry needling;Taping   PT Next Visit Plan Add core activation exercises to HEP. focus on lumbar and bilatearl hip mobility, check for more extensive soft tissue limitations in hips/back, proximal strengthening, core activation    PT Home Exercise Plan 10/26: SKTC, lumbar rotations, piriformis stretch; 11/10: addition of  core activation supine and seated hip flexion for core activation      Patient will benefit from skilled therapeutic intervention in order to improve the following deficits and impairments:  Abnormal gait, Increased fascial restricitons, Improper body mechanics, Pain, Decreased mobility, Increased muscle spasms, Postural dysfunction, Decreased range of motion, Decreased strength, Hypomobility, Decreased balance, Difficulty walking  Visit Diagnosis: Chronic bilateral low  back pain with left-sided sciatica  Muscle weakness (generalized)  Cramp and spasm  Other symptoms and signs involving the musculoskeletal system     Problem List Patient Active Problem List   Diagnosis Date Noted  . SLAP lesion of shoulder 08/04/2012  . Muscle weakness (generalized) 08/04/2012  . Chronic pain 08/31/2011  . Deformity, acquired 08/31/2011  . Hypertension 08/31/2011  . Insomnia 08/31/2011  . Epigastric discomfort 05/03/2011  . LUMBAR SPRAIN AND STRAIN 10/22/2009  . CUBITAL TUNNEL SYNDROME 10/24/2007  . SHOULDER PAIN 10/24/2007  . 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 ELBOW PAIN 05/25/2007   Ihor Austin, [place]; [place]  Aldona Lento 11/21/2015, 4:22 PM  [place], [place], [address] Phone: [phone]   Fax:  [phone]  Name: Duane [name] MRN: [id number] Date of Birth: 10/07/1960

## 2015-11-26 ENCOUNTER — Ambulatory Visit (HOSPITAL_COMMUNITY): Payer: Medicare Other | Admitting: Physical Therapy

## 2015-11-26 DIAGNOSIS — M5442 Lumbago with sciatica, left side: Secondary | ICD-10-CM | POA: Diagnosis not present

## 2015-11-26 DIAGNOSIS — M6281 Muscle weakness (generalized): Secondary | ICD-10-CM

## 2015-11-26 DIAGNOSIS — R29898 Other symptoms and signs involving the musculoskeletal system: Secondary | ICD-10-CM

## 2015-11-26 DIAGNOSIS — G8929 Other chronic pain: Secondary | ICD-10-CM

## 2015-11-26 DIAGNOSIS — R252 Cramp and spasm: Secondary | ICD-10-CM

## 2015-11-26 NOTE — Therapy (Signed)
[place], [place], [address] Phone: [phone]   Fax:  [phone]  Physical Therapy Treatment  Patient Details  Name: Duane [name] MRN: [id number] Date of Birth: [birth year] Referring Provider: Daisey Must   Encounter Date: 11/26/2015      PT End of Session - 11/26/15 1733    Visit Number 5   Number of Visits 12   Date for PT Re-Evaluation 11/27/15   Authorization Type Medicare    Authorization Time Period 11/06/15 to 12/18/15   Authorization - Visit Number 5   Authorization - Number of Visits 10   PT Start Time [id number]   PT Stop Time 1740   PT Time Calculation (min) 48 min   Activity Tolerance Patient tolerated treatment well   Behavior During Therapy [facility] for tasks assessed/performed      Past Medical History:  Diagnosis Date  . Arthritis   . Gout   . Hypertension     Past Surgical History:  Procedure Laterality Date  . ELBOW SURGERY    . FOOT FASCIOTOMY     X 5  . HEMORRHOID SURGERY     X2, Dr. Arnoldo Morale  . NECK EXPLORATION    . SHOULDER ARTHROTOMY     multiple     There were no vitals filed for this visit.      Subjective Assessment - 11/26/15 1653    Subjective Pt states he is about the same.  States his pain is 2/10 with hydrocodone.     Currently in Pain? Yes   Pain Score 2    Pain Location Back   Pain Orientation Left;Lower   Pain Descriptors / Indicators Constant                         [facility] Adult PT Treatment/Exercise - 11/26/15 0001      Lumbar Exercises: Stretches   Active Hamstring Stretch 2 reps;30 seconds   Active Hamstring Stretch Limitations supine    Lower Trunk Rotation 10 seconds  10 reps   Piriformis Stretch 2 reps;30 seconds   Piriformis Stretch Limitations seated     Lumbar Exercises: Supine   Bridge 20 reps   Bridge Limitations 3" holds with ab set   Straight Leg Raise 15 reps   Straight Leg Raises Limitations floating    Large  Ball Abdominal Isometric 5 seconds;15 reps   Large Ball Oblique Isometric 5 seconds;15 reps     Lumbar Exercises: Sidelying   Hip Abduction --     Lumbar Exercises: Prone   Straight Leg Raise 15 reps   Straight Leg Raises Limitations with core set   Other Prone Lumbar Exercises heel squeeze 15x 5" holds                  PT Short Term Goals - 11/06/15 1819      PT SHORT TERM GOAL #1   Title Patient to demonstrate only mild lumbar and thoracic ROM limitations in order to reduce pain and improve general mobility    Time 3   Period Weeks   Status New     PT SHORT TERM GOAL #2   Title Patient to demonstrate only moderate limitation in bilateral hip ROM on all planes in order to reduce pain and improve mechanics    Time 3   Period Weeks   Status New     PT SHORT TERM GOAL #3   Title Patient to  experience pain no more than 2/10 in order to improve QOL and functional task tolerance    Time 3   Period Weeks   Status New     PT SHORT TERM GOAL #4   Title Patient to be compliant with correctly and consistently performing appropriate HEP, to be updated PRN    Time 1   Period Weeks   Status New           PT Long Term Goals - 11/06/15 1820      PT LONG TERM GOAL #1   Title Patient to demonstrate functional strength 5/5 in order to assist in reducing pain and improving general function    Time 6   Period Weeks   Status New     PT LONG TERM GOAL #2   Title Patient to demonstrate correct functional lifting mechanics in order to prevent exacerbation of pain with general activities    Time 6   Period Weeks   Status New     PT LONG TERM GOAL #3   Title Patient to be participatory in regular physical activity program, at least 30 minutes in duration, at least 4 days per week, in order to maintain functional gains and improve general health status    Time 6   Period Weeks   Status New               Plan - 11/26/15 1733    Clinical Impression Statement  Continued with focus on core and proximal strengthening.  Pt able to complete all exercises without cues in proper form.  Pt able to recall newly added exercises from last session without cues.  No pain or increased symptoms reported at EOS.   Rehab Potential Good   Clinical Impairments Affecting Rehab Potential chronicity of pain, chronic use of pharmaceuticals, excellent motivation to participate with skilled PT services and for compliance with HEP    PT Frequency 2x / week   PT Duration 6 weeks   PT Treatment/Interventions ADLs/Self Care Home Management;Biofeedback;Cryotherapy;Moist Heat;Gait training;Stair training;Functional mobility training;Therapeutic activities;Therapeutic exercise;Balance training;Neuromuscular re-education;Patient/family education;Manual techniques;Passive range of motion;Energy conservation;Dry needling;Taping   PT Next Visit Plan Continue focus on lumbar and bilateral hip mobility, manual techniques as needed, proximal strengthening, core activation    PT Home Exercise Plan 10/26: SKTC, lumbar rotations, piriformis stretch; 11/10: addition of core activation supine and seated hip flexion for core activation      Patient will benefit from skilled therapeutic intervention in order to improve the following deficits and impairments:  Abnormal gait, Increased fascial restricitons, Improper body mechanics, Pain, Decreased mobility, Increased muscle spasms, Postural dysfunction, Decreased range of motion, Decreased strength, Hypomobility, Decreased balance, Difficulty walking  Visit Diagnosis: Chronic bilateral low back pain with left-sided sciatica  Muscle weakness (generalized)  Cramp and spasm  Other symptoms and signs involving the musculoskeletal system     Problem List Patient Active Problem List   Diagnosis Date Noted  . SLAP lesion of shoulder 08/04/2012  . Muscle weakness (generalized) 08/04/2012  . Chronic pain 08/31/2011  . Deformity, acquired  08/31/2011  . Hypertension 08/31/2011  . Insomnia 08/31/2011  . Epigastric discomfort 05/03/2011  . LUMBAR SPRAIN AND STRAIN 10/22/2009  . CUBITAL TUNNEL SYNDROME 10/24/2007  . SHOULDER PAIN 10/24/2007  . 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 ELBOW PAIN 05/25/2007    Teena Irani, PTA/CLT [phone]  11/26/2015, 6:06 PM  [place] [address] [place], [place], [address] Phone: [phone]  Fax:  [phone]  Name: Duane [name] MRN: [id number] Date of Birth: [birth year]

## 2015-12-02 ENCOUNTER — Ambulatory Visit (HOSPITAL_COMMUNITY): Payer: Medicare Other | Admitting: Physical Therapy

## 2015-12-02 DIAGNOSIS — R29898 Other symptoms and signs involving the musculoskeletal system: Secondary | ICD-10-CM

## 2015-12-02 DIAGNOSIS — G8929 Other chronic pain: Secondary | ICD-10-CM

## 2015-12-02 DIAGNOSIS — M5442 Lumbago with sciatica, left side: Secondary | ICD-10-CM | POA: Diagnosis not present

## 2015-12-02 DIAGNOSIS — M6281 Muscle weakness (generalized): Secondary | ICD-10-CM

## 2015-12-02 DIAGNOSIS — R252 Cramp and spasm: Secondary | ICD-10-CM

## 2015-12-02 NOTE — Therapy (Signed)
[place], [place], [address] Phone: [phone]   Fax:  [phone]  Physical Therapy Treatment (Re-Assessment)  Patient Details  Name: Duane [name] MRN: [id number] Date of Birth: September 09, 1960 Referring Provider: Daisey Must   Encounter Date: 12/02/2015      PT End of Session - 12/02/15 1649    Visit Number 6   Number of Visits 10   Date for PT Re-Evaluation 12/18/15   Authorization Type Medicare (G-codes done 6th session)   Authorization Time Period 11/06/15 to 12/18/15   Authorization - Visit Number 6   Authorization - Number of Visits 16   PT Start Time 1602   PT Stop Time 1641   PT Time Calculation (min) 39 min   Activity Tolerance Patient tolerated treatment well   Behavior During Therapy [facility] for tasks assessed/performed      Past Medical History:  Diagnosis Date  . Arthritis   . Gout   . Hypertension     Past Surgical History:  Procedure Laterality Date  . ELBOW SURGERY    . FOOT FASCIOTOMY     X 5  . HEMORRHOID SURGERY     X2, Dr. Arnoldo Morale  . NECK EXPLORATION    . SHOULDER ARTHROTOMY     multiple     There were no vitals filed for this visit.      Subjective Assessment - 12/02/15 1603    Subjective Patient arrives stating "I am tired, I'm alright"; he learned some new stretches while he has been here, but does not feel like its helping his pain. Its hard to say if he feels any diffrence in his pain thus far. He has not noticed any changes in his daily function. He rates himself as being 60/100 with pain being his last major complaint.    How long can you sit comfortably? 11/21- immediate discomfrot    How long can you stand comfortably? 11/21- 5 minutes    How long can you walk comfortably? 11/21- 5 minutes    Diagnostic tests MRI shows no specific/acute cause of back pain/radiculopathy    Patient Stated Goals reduce pain    Currently in Pain? Yes   Pain Score 2    Pain  Location Back   Pain Orientation Left;Lower   Pain Descriptors / Indicators Constant;Pressure   Pain Type Chronic pain   Pain Radiating Towards none    Pain Onset More than a month ago   Pain Frequency Constant   Aggravating Factors  bending, lifting items    Pain Relieving Factors pain medication, ice and heat    Effect of Pain on Daily Activities severe limitation on ADLs/PLOF             [facility] PT Assessment - 12/02/15 0001      AROM   Overall AROM Comments moderate hip stiffness noted today    Lumbar Flexion approximately 1 inch above toes    Lumbar Extension minimal limitation   Lumbar - Right Side Bend fingertips just above knee    Lumbar - Left Side Bend moderate limitation    Lumbar - Right Rotation mild limitation    Lumbar - Left Rotation moderate limitation      Strength   Right Hip Flexion 4+/5   Right Hip Extension 3-/5   Right Hip ABduction 4+/5   Left Hip Flexion 4/5   Left Hip Extension 3/5   Left Hip ABduction 4/5   Right Knee Flexion 4+/5  Right Knee Extension 5/5   Left Knee Flexion 4/5   Left Knee Extension 4/5   Right Ankle Dorsiflexion 4/5   Left Ankle Dorsiflexion 4-/5                     [facility] Adult PT Treatment/Exercise - 12/02/15 0001      Lumbar Exercises: Stretches   Single Knee to Chest Stretch 5 reps;10 seconds   Lower Trunk Rotation 5 reps;10 seconds     Lumbar Exercises: Standing   Heel Raises 15 reps   Heel Raises Limitations heel and toe    Forward Lunge 10 reps   Forward Lunge Limitations 6 inch box    Side Lunge 10 reps;Other (comment)  6 inch box    Row Both;10 reps   Theraband Level (Row) Level 2 (Red)   Shoulder Extension Both;10 reps   Theraband Level (Shoulder Extension) Level 2 (Red)     Lumbar Exercises: Seated   Hip Flexion on Ball 15 reps   Hip Flexion on Ball Limitations dyna-disk    Sit to Stand Limitations 4 way mini-core set 5x each way      Lumbar Exercises: Supine   Bridge 15 reps    Bridge Limitations staggered stance, 3 second hold    Other Supine Lumbar Exercises dead bugs 1x10, core set                 PT Education - 12/02/15 1649    Education provided Yes   Education Details progress, POC   Person(s) Educated Patient   Methods Explanation   Comprehension Verbalized understanding          PT Short Term Goals - 12/02/15 1613      PT SHORT TERM GOAL #1   Title Patient to demonstrate only mild lumbar and thoracic ROM limitations in order to reduce pain and improve general mobility    Time 3   Period Weeks   Status Partially Met     PT SHORT TERM GOAL #2   Title Patient to demonstrate only moderate limitation in bilateral hip ROM on all planes in order to reduce pain and improve mechanics    Time 3   Period Weeks   Status Partially Met     PT SHORT TERM GOAL #3   Title Patient to experience pain no more than 2/10 in order to improve QOL and functional task tolerance    Baseline 11/21- with medicines it stays around 2/10   Time 3   Period Weeks   Status Achieved     PT SHORT TERM GOAL #4   Title Patient to be compliant with correctly and consistently performing appropriate HEP, to be updated PRN    Baseline 11/21- he reports his home exercises are boring but he realizes it is necessary to continue with them    Time 1   Period Weeks   Status Achieved           PT Long Term Goals - 12/02/15 1614      PT LONG TERM GOAL #1   Title Patient to demonstrate functional strength 5/5 in order to assist in reducing pain and improving general function    Time 6   Period Weeks   Status Partially Met     PT LONG TERM GOAL #2   Title Patient to demonstrate correct functional lifting mechanics in order to prevent exacerbation of pain with general activities    Time 6   Period Weeks  Status Achieved     PT LONG TERM GOAL #3   Title Patient to be participatory in regular physical activity program, at least 30 minutes in duration, at least 4  days per week, in order to maintain functional gains and improve general health status    Baseline 12-09-2022- he is already involved in a cardio program at home    Time 6   Period Weeks   Status On-going               Plan - 12-09-15 1650    Clinical Impression Statement Re-assessment performed today. Patient appears to be making good progress with objective measures, showing improved lumbar ROM as well as improving functional strength; he does state that he has not noticed a major change in his pain or functional abilities subjectively however, although he is pleased that he has learned some new techniques for his home program. Recommend 4 more sessions with progression of lumbar/hip mobility as well as functional strength before DC to advanced HEP.    Rehab Potential Good   Clinical Impairments Affecting Rehab Potential chronicity of pain, chronic use of pharmaceuticals, excellent motivation to participate with skilled PT services and for compliance with HEP    PT Frequency Other (comment)  4 more sessions    PT Duration Other (comment)  4 more sessions    PT Treatment/Interventions ADLs/Self Care Home Management;Biofeedback;Cryotherapy;Moist Heat;Gait training;Stair training;Functional mobility training;Therapeutic activities;Therapeutic exercise;Balance training;Neuromuscular re-education;Patient/family education;Manual techniques;Passive range of motion;Energy conservation;Dry needling;Taping   PT Next Visit Plan Continue focus on lumbar and bilateral hip mobility, manual techniques as needed, proximal strengthening, core activation. Update HEP.    PT Home Exercise Plan 10/26: SKTC, lumbar rotations, piriformis stretch; 11/10: addition of core activation supine and seated hip flexion for core activation   Consulted and Agree with Plan of Care Patient      Patient will benefit from skilled therapeutic intervention in order to improve the following deficits and impairments:  Abnormal  gait, Increased fascial restricitons, Improper body mechanics, Pain, Decreased mobility, Increased muscle spasms, Postural dysfunction, Decreased range of motion, Decreased strength, Hypomobility, Decreased balance, Difficulty walking  Visit Diagnosis: Chronic bilateral low back pain with left-sided sciatica  Muscle weakness (generalized)  Cramp and spasm  Other symptoms and signs involving the musculoskeletal system       G-Codes - 2015-12-09 1652    Functional Assessment Tool Used Based on skilled clinical assessment based on ROM, strength, pain patterns, joint mobility of hips and spine    Functional Limitation Mobility: Walking and moving around   Mobility: Walking and Moving Around Current Status [phone]) At least 20 percent but less than 40 percent impaired, limited or restricted   Mobility: Walking and Moving Around Goal Status [phone]) At least 20 percent but less than 40 percent impaired, limited or restricted      Problem List Patient Active Problem List   Diagnosis Date Noted  . SLAP lesion of shoulder 08/04/2012  . Muscle weakness (generalized) 08/04/2012  . Chronic pain 08/31/2011  . Deformity, acquired 08/31/2011  . Hypertension 08/31/2011  . Insomnia 08/31/2011  . Epigastric discomfort 05/03/2011  . LUMBAR SPRAIN AND STRAIN 10/22/2009  . CUBITAL TUNNEL SYNDROME 10/24/2007  . SHOULDER PAIN 10/24/2007  . 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 ELBOW PAIN 05/25/2007    Deniece Ree PT, DPT [facility] [address] [place], [place], [address] Phone: [phone]   Fax:  [phone]  Name: Duane [name]  MRN: [id number] Date of Birth: 1960-01-16

## 2015-12-09 ENCOUNTER — Ambulatory Visit (HOSPITAL_COMMUNITY): Payer: Medicare Other

## 2015-12-09 DIAGNOSIS — R29898 Other symptoms and signs involving the musculoskeletal system: Secondary | ICD-10-CM

## 2015-12-09 DIAGNOSIS — G8929 Other chronic pain: Secondary | ICD-10-CM

## 2015-12-09 DIAGNOSIS — R252 Cramp and spasm: Secondary | ICD-10-CM

## 2015-12-09 DIAGNOSIS — M5442 Lumbago with sciatica, left side: Secondary | ICD-10-CM | POA: Diagnosis not present

## 2015-12-09 DIAGNOSIS — M6281 Muscle weakness (generalized): Secondary | ICD-10-CM

## 2015-12-09 NOTE — Therapy (Signed)
[place], [place], [address] Phone: [phone]   Fax:  [phone]  Physical Therapy Treatment  Patient Details  Name: Duane [name] MRN: [id number] Date of Birth: 1960/11/29 Referring Provider: Daisey Must   Encounter Date: 12/09/2015      PT End of Session - 12/09/15 1358    Visit Number 7   Number of Visits 10   Date for PT Re-Evaluation 12/18/15   Authorization Type Medicare (G-codes done 6th session)   Authorization Time Period 11/06/15 to 12/18/15   Authorization - Visit Number 7   Authorization - Number of Visits 16   PT Start Time 1350   PT Stop Time 1429   PT Time Calculation (min) 39 min   Activity Tolerance Patient tolerated treatment well   Behavior During Therapy [facility] for tasks assessed/performed      Past Medical History:  Diagnosis Date  . Arthritis   . Gout   . Hypertension     Past Surgical History:  Procedure Laterality Date  . ELBOW SURGERY    . FOOT FASCIOTOMY     X 5  . HEMORRHOID SURGERY     X2, Dr. Arnoldo Morale  . NECK EXPLORATION    . SHOULDER ARTHROTOMY     multiple     There were no vitals filed for this visit.      Subjective Assessment - 12/09/15 1353    Subjective Pt stated he feels likes he "tweeked" his lower back Rt side during workout yesterday, pain scale 1/10.     Patient Stated Goals reduce pain    Currently in Pain? Yes   Pain Score 1    Pain Location Back   Pain Orientation Lower;Right   Pain Descriptors / Indicators Pressure   Pain Type Chronic pain   Pain Radiating Towards none   Pain Onset More than a month ago   Pain Frequency Constant   Aggravating Factors  bending, lifting items   Pain Relieving Factors pain medication, ice and heat   Effect of Pain on Daily Activities severe limitation on ADLs/PLOF          [facility] Adult PT Treatment/Exercise - 12/09/15 0001      Lumbar Exercises: Standing   Functional Squats 15 reps   Forward  Lunge 15 reps   Forward Lunge Limitations 6 inch box    Side Lunge 15 reps   Scapular Retraction 10 reps   Theraband Level (Scapular Retraction) Level 2 (Red)   Row Both;10 reps   Theraband Level (Row) Level 2 (Red)   Shoulder Extension Both;10 reps   Theraband Level (Shoulder Extension) Level 2 (Red)   Other Standing Lumbar Exercises SLS 60" Bil   Other Standing Lumbar Exercises Vector stance 5x 10" each LE     Lumbar Exercises: Seated   Hip Flexion on Ball 15 reps   Hip Flexion on Ball Limitations on theraball 5" holds deadbug; BLE   Sit to Stand 5 reps   Sit to Stand Limitations STS from theraball                   PT Short Term Goals - 12/02/15 1613      PT SHORT TERM GOAL #1   Title Patient to demonstrate only mild lumbar and thoracic ROM limitations in order to reduce pain and improve general mobility    Time 3   Period Weeks   Status Partially Met     PT SHORT TERM  GOAL #2   Title Patient to demonstrate only moderate limitation in bilateral hip ROM on all planes in order to reduce pain and improve mechanics    Time 3   Period Weeks   Status Partially Met     PT SHORT TERM GOAL #3   Title Patient to experience pain no more than 2/10 in order to improve QOL and functional task tolerance    Baseline 11/21- with medicines it stays around 2/10   Time 3   Period Weeks   Status Achieved     PT SHORT TERM GOAL #4   Title Patient to be compliant with correctly and consistently performing appropriate HEP, to be updated PRN    Baseline 11/21- he reports his home exercises are boring but he realizes it is necessary to continue with them    Time 1   Period Weeks   Status Achieved           PT Long Term Goals - 12/02/15 1614      PT LONG TERM GOAL #1   Title Patient to demonstrate functional strength 5/5 in order to assist in reducing pain and improving general function    Time 6   Period Weeks   Status Partially Met     PT LONG TERM GOAL #2   Title  Patient to demonstrate correct functional lifting mechanics in order to prevent exacerbation of pain with general activities    Time 6   Period Weeks   Status Achieved     PT LONG TERM GOAL #3   Title Patient to be participatory in regular physical activity program, at least 30 minutes in duration, at least 4 days per week, in order to maintain functional gains and improve general health status    Baseline 11/21- he is already involved in a cardio program at home    Time 6   Period Weeks   Status On-going               Plan - 12/09/15 1546    Clinical Impression Statement Session focus on improving core stability and proximal musculature strengthening.  Pt able to complete majority of therex with minimal cueing required for form.  Added SLS and vector stance for core stability.  Continued education on importance of posture awareness and pt. given RTB and printout to continue posture strengthening at home.  EOS no reports of increased pain.   Rehab Potential Good   Clinical Impairments Affecting Rehab Potential chronicity of pain, chronic use of pharmaceuticals, excellent motivation to participate with skilled PT services and for compliance with HEP    PT Frequency --  4 more sessions   PT Duration --  4 more sessions   PT Next Visit Plan Continue focus on lumbar and bilateral hip mobility, manual techniques as needed, proximal strengthening, core activation. Update HEP.    PT Home Exercise Plan 10/26: SKTC, lumbar rotations, piriformis stretch; 11/10: addition of core activation supine and seated hip flexion for core activation; 12/08/2105: additional posture theraband RTB with printout       Patient will benefit from skilled therapeutic intervention in order to improve the following deficits and impairments:  Abnormal gait, Increased fascial restricitons, Improper body mechanics, Pain, Decreased mobility, Increased muscle spasms, Postural dysfunction, Decreased range of motion,  Decreased strength, Hypomobility, Decreased balance, Difficulty walking  Visit Diagnosis: Chronic bilateral low back pain with left-sided sciatica  Muscle weakness (generalized)  Cramp and spasm  Other symptoms and signs involving the musculoskeletal  system     Problem List Patient Active Problem List   Diagnosis Date Noted  . SLAP lesion of shoulder 08/04/2012  . Muscle weakness (generalized) 08/04/2012  . Chronic pain 08/31/2011  . Deformity, acquired 08/31/2011  . Hypertension 08/31/2011  . Insomnia 08/31/2011  . Epigastric discomfort 05/03/2011  . LUMBAR SPRAIN AND STRAIN 10/22/2009  . CUBITAL TUNNEL SYNDROME 10/24/2007  . SHOULDER PAIN 10/24/2007  . 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 ELBOW PAIN 05/25/2007   Ihor Austin, [place]; [place]  Aldona Lento 12/09/2015, 3:51 PM  [facility] [address] [place], [place], [address] Phone: [phone]   Fax:  [phone]  Name: AMAREON PHUNG MRN: [id number] Date of Birth: December 05, 1960

## 2015-12-17 ENCOUNTER — Ambulatory Visit (HOSPITAL_COMMUNITY): Payer: Medicare Other | Attending: Family Medicine | Admitting: Physical Therapy

## 2015-12-17 DIAGNOSIS — R29898 Other symptoms and signs involving the musculoskeletal system: Secondary | ICD-10-CM | POA: Insufficient documentation

## 2015-12-17 DIAGNOSIS — M5442 Lumbago with sciatica, left side: Secondary | ICD-10-CM | POA: Insufficient documentation

## 2015-12-17 DIAGNOSIS — M6281 Muscle weakness (generalized): Secondary | ICD-10-CM | POA: Insufficient documentation

## 2015-12-17 DIAGNOSIS — R252 Cramp and spasm: Secondary | ICD-10-CM | POA: Diagnosis present

## 2015-12-17 DIAGNOSIS — G8929 Other chronic pain: Secondary | ICD-10-CM | POA: Insufficient documentation

## 2015-12-17 NOTE — Patient Instructions (Addendum)
Bridging: with Straight Leg Raise    With legs bent, lift buttocks _8-10___ inches from floor. Then slowly extend right knee, keeping stomach tight.Return and repeat to the left side.  Repeat 5-_10___ times per set. Do __1__ sets per session. Do ____ sessions per day.  http://orth.exer.us/1104   Copyright  VHI. All rights reserved.  On Elbows (Prone)   Using your back muscles raise your head and chest up as far as possible then Rise up on elbows as high as possible, keeping hips on floor. Hold _30___ seconds. Repeat __3__ times per set. Do ___1_ sets per session. Do _2___ sessions per day.  http://orth.exer.us/92   Copyright  VHI. All rights reserved.

## 2015-12-17 NOTE — Therapy (Signed)
[place], [place], [address] Phone: [phone]   Fax:  [phone]  Physical Therapy Treatment  Patient Details  Name: Duane [name] MRN: [id number] Date of Birth: Nov 15, 1960 Referring Provider: Daisey Must   Encounter Date: 12/17/2015      PT End of Session - 12/17/15 1433    Visit Number 8   Number of Visits 10   Date for PT Re-Evaluation 12/18/15   Authorization Type Medicare (G-codes done 6th session)   Authorization Time Period 11/06/15 to 12/18/15   Authorization - Visit Number 8   Authorization - Number of Visits 16   PT Start Time 4496   PT Stop Time 1433   PT Time Calculation (min) 35 min   Activity Tolerance Patient tolerated treatment well   Behavior During Therapy [facility] for tasks assessed/performed      Past Medical History:  Diagnosis Date  . Arthritis   . Gout   . Hypertension     Past Surgical History:  Procedure Laterality Date  . ELBOW SURGERY    . FOOT FASCIOTOMY     X 5  . HEMORRHOID SURGERY     X2, Dr. Arnoldo Morale  . NECK EXPLORATION    . SHOULDER ARTHROTOMY     multiple     There were no vitals filed for this visit.      Subjective Assessment - 12/17/15 1403    Subjective Pt states that he is having no problem.  He is not having any radicular pain at this time.    Currently in Pain? Yes   Pain Score 2    Pain Orientation Lower;Left   Pain Descriptors / Indicators Aching;Pressure   Pain Type Chronic pain   Pain Onset More than a month ago   Pain Frequency Intermittent   Aggravating Factors  standing or bending over.   Pain Relieving Factors pain medication    Effect of Pain on Daily Activities increases                         [facility] Adult PT Treatment/Exercise - 12/17/15 0001      Lumbar Exercises: Stretches   Active Hamstring Stretch 2 reps;60 seconds   Active Hamstring Stretch Limitations long sitting    Prone on Elbows Stretch 5 reps;60  seconds   Quad Stretch 3 reps;30 seconds     Lumbar Exercises: Standing   Functional Squats 15 reps   Forward Lunge 10 reps     Lumbar Exercises: Seated   Hip Flexion on Ball --   Hip Flexion on Ball Limitations --   Sit to Stand --   Sit to Stand Limitations scapular retraction x 10      Lumbar Exercises: Supine   Bridge Limitations bridge with SLR x 5 each      Lumbar Exercises: Sidelying   Other Sidelying Lumbar Exercises hip abduction with circles forward and back x 10     Lumbar Exercises: Prone   Straight Leg Raise 15 reps with pillow under abdominal area                   PT Short Term Goals - 12/02/15 1613      PT SHORT TERM GOAL #1   Title Patient to demonstrate only mild lumbar and thoracic ROM limitations in order to reduce pain and improve general mobility    Time 3   Period Weeks   Status Partially Met  PT SHORT TERM GOAL #2   Title Patient to demonstrate only moderate limitation in bilateral hip ROM on all planes in order to reduce pain and improve mechanics    Time 3   Period Weeks   Status Partially Met     PT SHORT TERM GOAL #3   Title Patient to experience pain no more than 2/10 in order to improve QOL and functional task tolerance    Baseline 11/21- with medicines it stays around 2/10   Time 3   Period Weeks   Status Achieved     PT SHORT TERM GOAL #4   Title Patient to be compliant with correctly and consistently performing appropriate HEP, to be updated PRN    Baseline 11/21- he reports his home exercises are boring but he realizes it is necessary to continue with them    Time 1   Period Weeks   Status Achieved           PT Long Term Goals - 12/02/15 1614      PT LONG TERM GOAL #1   Title Patient to demonstrate functional strength 5/5 in order to assist in reducing pain and improving general function    Time 6   Period Weeks   Status Partially Met     PT LONG TERM GOAL #2   Title Patient to demonstrate correct  functional lifting mechanics in order to prevent exacerbation of pain with general activities    Time 6   Period Weeks   Status Achieved     PT LONG TERM GOAL #3   Title Patient to be participatory in regular physical activity program, at least 30 minutes in duration, at least 4 days per week, in order to maintain functional gains and improve general health status    Baseline 11/21- he is already involved in a cardio program at home    Time 6   Period Weeks   Status On-going               Plan - 12/17/15 1433    Clinical Impression Statement Added bridge with SLR pt needed verbal and manual cuing to keep proper stabilization.  Added POE exercise to promote trunk extension.  PT unable to complete prone SLR properly,(using back mm, therefore placed two pillows under lower abdominal area; pt was then able to complete exercise correctly.  Pt able to complete other exercises without cuing.    Rehab Potential Good   Clinical Impairments Affecting Rehab Potential chronicity of pain, chronic use of pharmaceuticals, excellent motivation to participate with skilled PT services and for compliance with HEP    PT Frequency --  4 more sessions   PT Duration --  4 more sessions   PT Next Visit Plan Continue focus on lumbar and bilateral hip mobility, manual techniques as needed, proximal strengthening, core activation. Update HEP.    PT Home Exercise Plan 10/26: SKTC, lumbar rotations, piriformis stretch; 11/10: addition of core activation supine and seated hip flexion for core activation; 12/08/2105: additional posture theraband RTB with printout       Patient will benefit from skilled therapeutic intervention in order to improve the following deficits and impairments:  Abnormal gait, Increased fascial restricitons, Improper body mechanics, Pain, Decreased mobility, Increased muscle spasms, Postural dysfunction, Decreased range of motion, Decreased strength, Hypomobility, Decreased balance,  Difficulty walking  Visit Diagnosis: Chronic bilateral low back pain with left-sided sciatica  Muscle weakness (generalized)  Cramp and spasm     Problem List Patient  Active Problem List   Diagnosis Date Noted  . SLAP lesion of shoulder 08/04/2012  . Muscle weakness (generalized) 08/04/2012  . Chronic pain 08/31/2011  . Deformity, acquired 08/31/2011  . Hypertension 08/31/2011  . Insomnia 08/31/2011  . Epigastric discomfort 05/03/2011  . LUMBAR SPRAIN AND STRAIN 10/22/2009  . CUBITAL TUNNEL SYNDROME 10/24/2007  . SHOULDER PAIN 10/24/2007  . 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 ELBOW PAIN 05/25/2007  Rayetta Humphrey, PT CLT [phone] 12/17/2015, 2:39 PM  [place] [address] [place], [place], [address] Phone: [phone]   Fax:  [phone]  Name: Duane [name] MRN: [id number] Date of Birth: 1960-06-08

## 2015-12-19 ENCOUNTER — Ambulatory Visit (HOSPITAL_COMMUNITY): Payer: Medicare Other | Admitting: Physical Therapy

## 2015-12-19 DIAGNOSIS — R252 Cramp and spasm: Secondary | ICD-10-CM

## 2015-12-19 DIAGNOSIS — M5442 Lumbago with sciatica, left side: Principal | ICD-10-CM

## 2015-12-19 DIAGNOSIS — G8929 Other chronic pain: Secondary | ICD-10-CM

## 2015-12-19 DIAGNOSIS — M6281 Muscle weakness (generalized): Secondary | ICD-10-CM

## 2015-12-19 DIAGNOSIS — R29898 Other symptoms and signs involving the musculoskeletal system: Secondary | ICD-10-CM

## 2015-12-19 NOTE — Therapy (Signed)
[place], [place], [address] Phone: [phone]   Fax:  [phone]  Physical Therapy Treatment  Patient Details  Name: Duane [name] MRN: [id number] Date of Birth: 1960-09-24 Referring Provider: Daisey Must   Encounter Date: 12/19/2015      PT End of Session - 12/19/15 1440    Visit Number 9   Number of Visits 10   Date for PT Re-Evaluation 12/18/15   Authorization Type Medicare (G-codes done 6th session)   Authorization Time Period 11/06/15 to 12/18/15   Authorization - Visit Number 9   Authorization - Number of Visits 16   PT Start Time 8315   PT Stop Time 1515   PT Time Calculation (min) 43 min   Activity Tolerance Patient tolerated treatment well   Behavior During Therapy [facility] for tasks assessed/performed      Past Medical History:  Diagnosis Date  . Arthritis   . Gout   . Hypertension     Past Surgical History:  Procedure Laterality Date  . ELBOW SURGERY    . FOOT FASCIOTOMY     X 5  . HEMORRHOID SURGERY     X2, Dr. Arnoldo Morale  . NECK EXPLORATION    . SHOULDER ARTHROTOMY     multiple     There were no vitals filed for this visit.      Subjective Assessment - 12/19/15 1431    Subjective Pt states that things are going well. He is at about a 2/10 pain currently. He feels that is it from the arthritis that won't really change much.    Currently in Pain? Yes   Pain Score 2    Pain Location Back   Pain Onset More than a month ago                         [facility] Adult PT Treatment/Exercise - 12/19/15 0001      Exercises   Exercises Other Exercises   Other Exercises  seated trunk rotation x20 reps each direction with green TB     Lumbar Exercises: Stretches   Active Hamstring Stretch 30 seconds;3 reps   Active Hamstring Stretch Limitations 12" step     Lumbar Exercises: Standing   Forward Lunge 10 reps   Other Standing Lumbar Exercises D1/D2 PNF with red TB x25  reps      Lumbar Exercises: Seated   Hip Flexion on Ball 20 reps;Both   Hip Flexion on Ball Limitations on dyna disc     Lumbar Exercises: Supine   Bridge Limitations bridge hold with knee ext 2x10; single leg bridge x10 each     Lumbar Exercises: Prone   Straight Leg Raise 15 reps;5 seconds     Knee/Hip Exercises: Standing   Wall Squat 20 reps                PT Education - 12/19/15 1439    Education provided Yes   Education Details technique with therex    Person(s) Educated Patient   Methods Explanation;Verbal cues   Comprehension Verbalized understanding;Returned demonstration          PT Short Term Goals - 12/02/15 1613      PT SHORT TERM GOAL #1   Title Patient to demonstrate only mild lumbar and thoracic ROM limitations in order to reduce pain and improve general mobility    Time 3   Period Weeks   Status Partially Met  PT SHORT TERM GOAL #2   Title Patient to demonstrate only moderate limitation in bilateral hip ROM on all planes in order to reduce pain and improve mechanics    Time 3   Period Weeks   Status Partially Met     PT SHORT TERM GOAL #3   Title Patient to experience pain no more than 2/10 in order to improve QOL and functional task tolerance    Baseline 11/21- with medicines it stays around 2/10   Time 3   Period Weeks   Status Achieved     PT SHORT TERM GOAL #4   Title Patient to be compliant with correctly and consistently performing appropriate HEP, to be updated PRN    Baseline 11/21- he reports his home exercises are boring but he realizes it is necessary to continue with them    Time 1   Period Weeks   Status Achieved           PT Long Term Goals - 12/02/15 1614      PT LONG TERM GOAL #1   Title Patient to demonstrate functional strength 5/5 in order to assist in reducing pain and improving general function    Time 6   Period Weeks   Status Partially Met     PT LONG TERM GOAL #2   Title Patient to demonstrate  correct functional lifting mechanics in order to prevent exacerbation of pain with general activities    Time 6   Period Weeks   Status Achieved     PT LONG TERM GOAL #3   Title Patient to be participatory in regular physical activity program, at least 30 minutes in duration, at least 4 days per week, in order to maintain functional gains and improve general health status    Baseline 11/21- he is already involved in a cardio program at home    Time 6   Period Weeks   Status On-going               Plan - 12/19/15 1514    Clinical Impression Statement Today's session continued with therex progression to address LE/trunk strength. Pt reporting minimal pain upon arrival and showing no signs of difficulty with exercises performed today. Discussed possibility of d/c at next appointment due to evaluating therapist's note from last reassessment and based on his performance during today's session. He verbalized understanding with this.   Rehab Potential Good   Clinical Impairments Affecting Rehab Potential chronicity of pain, chronic use of pharmaceuticals, excellent motivation to participate with skilled PT services and for compliance with HEP    PT Frequency --  4 more sessions   PT Duration --  4 more sessions   PT Treatment/Interventions ADLs/Self Care Home Management;Biofeedback;Cryotherapy;Moist Heat;Gait training;Stair training;Functional mobility training;Therapeutic activities;Therapeutic exercise;Balance training;Neuromuscular re-education;Patient/family education;Manual techniques;Passive range of motion;Energy conservation;Dry needling;Taping   PT Next Visit Plan Continue focus on lumbar and bilateral hip mobility, manual techniques as needed, proximal strengthening, core activation.    PT Home Exercise Plan 10/26: SKTC, lumbar rotations, piriformis stretch; 11/10: addition of core activation supine and seated hip flexion for core activation; 12/08/2105: additional posture theraband  RTB with printout    Consulted and Agree with Plan of Care Patient      Patient will benefit from skilled therapeutic intervention in order to improve the following deficits and impairments:  Abnormal gait, Increased fascial restricitons, Improper body mechanics, Pain, Decreased mobility, Increased muscle spasms, Postural dysfunction, Decreased range of motion, Decreased strength, Hypomobility, Decreased balance,  Difficulty walking  Visit Diagnosis: Chronic bilateral low back pain with left-sided sciatica  Muscle weakness (generalized)  Cramp and spasm  Other symptoms and signs involving the musculoskeletal system     Problem List Patient Active Problem List   Diagnosis Date Noted  . SLAP lesion of shoulder 08/04/2012  . Muscle weakness (generalized) 08/04/2012  . Chronic pain 08/31/2011  . Deformity, acquired 08/31/2011  . Hypertension 08/31/2011  . Insomnia 08/31/2011  . Epigastric discomfort 05/03/2011  . LUMBAR SPRAIN AND STRAIN 10/22/2009  . CUBITAL TUNNEL SYNDROME 10/24/2007  . SHOULDER PAIN 10/24/2007  . 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 ELBOW PAIN 05/25/2007    3:22 PM,12/19/15 Elly Modena PT, DPT [facility] [address] [place], [place], [address] Phone: [phone]   Fax:  [phone]  Name: Duane [name] MRN: [id number] Date of Birth: September 19, 1960

## 2015-12-23 ENCOUNTER — Telehealth (HOSPITAL_COMMUNITY): Payer: Self-pay | Admitting: Family Medicine

## 2015-12-23 ENCOUNTER — Ambulatory Visit (HOSPITAL_COMMUNITY): Payer: Medicare Other | Admitting: Physical Therapy

## 2015-12-23 NOTE — Telephone Encounter (Signed)
12/23/15 pt left a message to cx but no reason was given

## 2015-12-25 ENCOUNTER — Telehealth (HOSPITAL_COMMUNITY): Payer: Self-pay | Admitting: Physical Therapy

## 2015-12-25 ENCOUNTER — Ambulatory Visit (HOSPITAL_COMMUNITY): Payer: Medicare Other | Admitting: Physical Therapy

## 2015-12-25 NOTE — Telephone Encounter (Signed)
No-show. Asked front desk staff to call patient, see if he wants to make up this session- if not OK to discharge.    Deniece Ree PT, DPT [phone]

## 2015-12-30 ENCOUNTER — Telehealth (HOSPITAL_COMMUNITY): Payer: Self-pay | Admitting: Physical Therapy

## 2015-12-30 ENCOUNTER — Ambulatory Visit (HOSPITAL_COMMUNITY): Payer: Medicare Other | Admitting: Physical Therapy

## 2015-12-30 NOTE — Telephone Encounter (Signed)
Patient had his 2nd no-show today. Called and left message detailing this and asking patient to call us letting us know if he would like to be discharged or if he would like to keep his next appointment.    Deniece Ree PT, DPT [phone]

## 2015-12-31 ENCOUNTER — Telehealth (HOSPITAL_COMMUNITY): Payer: Self-pay | Admitting: Family Medicine
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
## 2016-01-01 ENCOUNTER — Ambulatory Visit (HOSPITAL_COMMUNITY): Payer: Medicare Other | Admitting: Physical Therapy

## 2016-01-06 ENCOUNTER — Encounter (HOSPITAL_COMMUNITY): Payer: Medicare Other | Admitting: Physical Therapy

## 2016-01-08 ENCOUNTER — Encounter (HOSPITAL_COMMUNITY): Payer: Medicare Other

## 2016-01-13 ENCOUNTER — Encounter (HOSPITAL_COMMUNITY): Payer: Medicare Other | Admitting: Physical Therapy

## 2016-01-20 ENCOUNTER — Encounter (HOSPITAL_COMMUNITY): Payer: Medicare Other | Admitting: Physical Therapy

## 2016-01-27 ENCOUNTER — Telehealth: Payer: Self-pay

## 2016-01-27 NOTE — Telephone Encounter (Signed)
Duane [name] - Pt says the [facility] clinic needs a letter stating that you are reducing him off xanax.  Call him at [phone] when ready to pick up or for questions.

## 2016-01-28 NOTE — Telephone Encounter (Signed)
Please advise 

## 2016-01-30 ENCOUNTER — Encounter: Payer: Self-pay | Admitting: Family Medicine

## 2016-01-30 NOTE — Telephone Encounter (Signed)
I'm in clinic tomorrow.  I complete the letter and then sign it then.

## 2016-01-31 NOTE — Telephone Encounter (Signed)
I saw Duane [name] one time in October.  At that visit he wanted to be seen to establish care for prescription of chronic pain medication. We referred him instead to a pain clinic. Also decreased him from 4 times a day down to 3 times a day of his Xanax. However he has not been seen since that initial visit in our clinic.   I have completed a letter stating that we decreased his Xanax from 4 times a day to 3 times a day.  It is ready for him to pick up.

## 2016-02-04 ENCOUNTER — Ambulatory Visit (INDEPENDENT_AMBULATORY_CARE_PROVIDER_SITE_OTHER): Payer: Medicare Other | Admitting: Family Medicine

## 2016-02-04 VITALS — BP 130/68 | HR 80 | Temp 98.2°F | Ht 69.5 in | Wt 142.4 lb

## 2016-02-04 DIAGNOSIS — F32A Depression, unspecified: Secondary | ICD-10-CM

## 2016-02-04 DIAGNOSIS — F329 Major depressive disorder, single episode, unspecified: Secondary | ICD-10-CM | POA: Diagnosis not present

## 2016-02-04 DIAGNOSIS — G894 Chronic pain syndrome: Secondary | ICD-10-CM

## 2016-02-04 MED ORDER — VENLAFAXINE HCL 37.5 MG PO TABS: 37.5000 mg | ORAL_TABLET | Freq: Two times a day (BID) | ORAL | 1 refills | Status: DC

## 2016-02-04 NOTE — Patient Instructions (Addendum)
  I have started you on Effexor for your racing thoughts.    This generally takes about 10 days to notice an effect. This generally takes about 6 weeks to notice a maximal effect.  Come back in the next 4-6 weeks so we can see how you're doing with this.   I have written a letter for your.  It was good to see you again   IF you received an x-ray today, you will receive an invoice from [facility]. Please contact [facility] at [phone] with questions or concerns regarding your invoice.   IF you received labwork today, you will receive an invoice from [facility]. Please contact [facility] at [phone] with questions or concerns regarding your invoice.   Our billing staff will not be able to assist you with questions regarding bills from these companies.  You will be contacted with the lab results as soon as they are available. The fastest way to get your results is to activate your My Chart account. Instructions are located on the last page of this paperwork. If you have not heard from [place] regarding the results in 2 weeks, please contact this office.

## 2016-02-04 NOTE — Progress Notes (Signed)
   Duane [name] is a 56 y.o. male who presents to [place] at [facility] today for FU for taking xanax:  1.  Xanax use:  Patient seen at [facility] for his dilaudid.  He is still taking xanax to help with sleep.  He has started eating a therapist who is encouraged him to get out more and start making friends. They talked him about his racing thoughts. He does endorse this. He also some depressive symptoms centered mostly around fact he has no real friends or family. He denies any suicidal or homicidal ideation. No changes in appetite. Denies irritability. He actually denies any real anxiety and states it is more just depressive thoughts.  Last use was Jan 16.  He is now only using this for sleep at night, as opposed to 4 x daily.    Back pain is controlled with Dilaudid provided by his chronic pain center. No lower extremity weakness, paresthesias, numbness.   ROS as above  PMH reviewed. Patient is a nonsmoker.   Past Medical History:  Diagnosis Date  . Arthritis   . Gout   . Hypertension    Past Surgical History:  Procedure Laterality Date  . ELBOW SURGERY    . FOOT FASCIOTOMY     X 5  . HEMORRHOID SURGERY     X2, Dr. Arnoldo Morale  . NECK EXPLORATION    . SHOULDER ARTHROTOMY     multiple     Medications reviewed. Current Outpatient Prescriptions  Medication Sig Dispense Refill  . enalapril (VASOTEC) 10 MG tablet 10 mg tablet; oral every day    . HYDROmorphone (DILAUDID) 4 MG tablet Take 1 tablet (4 mg total) by mouth every 4 (four) hours as needed for severe pain. Do not fill until 11/24/15 180 tablet 0  . indomethacin (INDOCIN) 50 MG capsule Take 50 mg by mouth 3 (three) times daily as needed.    . ALPRAZolam (XANAX) 1 MG tablet Take 1 tab po daily x 3 days -- Do not fill until 11/24/15 (Patient not taking: Reported on 02/04/2016) 90 tablet 1  . diclofenac (VOLTAREN) 75 MG EC tablet Take 75 mg by mouth 2 (two) times daily.    . promethazine (PHENERGAN) 25 MG tablet Take 0.5  tablets (12.5 mg total) by mouth every 6 (six) hours as needed for nausea. 20 tablet 0   No current facility-administered medications for this visit.      Physical Exam:  BP 130/68 (BP Location: Left Arm, Patient Position: Sitting, Cuff Size: Small)   Pulse 80   Temp 98.2 F (36.8 C) (Oral)   Ht 5' 9.5" (1.765 m)   Wt 142 lb 6.4 oz (64.6 kg)   SpO2 100%   BMI 20.73 kg/m  Gen:  Alert, cooperative patient who appears stated age in no acute distress.  Vital signs reviewed.  Left arm well-developed, right arm with muscle weakness HEENT: EOMI,  MMM Psych:  Much improved from last visit.  Much less pressured speech.  No hallucinations.  No SI/HI.    Assessment and Plan:  1. Depresson: - psych symptoms MUCH better than last visit. Pleasant, interactive.  - starting effexor.  - Continue tapered use of xanax while this is building up in his system.   - no SI/HI - FU in 4 - 6 weeks to assess for improvement.   2.  Chronic pain: - continue at [facility]

## 2016-02-07 NOTE — Telephone Encounter (Signed)
Left message for him to advise letter ready for pick up

## 2016-02-17 ENCOUNTER — Telehealth: Payer: Self-pay

## 2016-02-17 NOTE — Telephone Encounter (Signed)
fyi

## 2016-02-17 NOTE — Telephone Encounter (Signed)
Pt said the medication venlasaxine 37.5 side affects are too drastic for him and he has to stop taking this med.. Pt is seening dr Mingo Amber. Pt said that he was suppose to pick up a letter from dr. Mingo Amber and he already received it and that the extra letter can go in his file.Marland Kitchen  Please advise: [phone]

## 2016-02-18 NOTE — Telephone Encounter (Signed)
Ok thanks for letting me know

## 2016-03-31 ENCOUNTER — Telehealth: Payer: Self-pay | Admitting: Podiatry

## 2016-04-05 ENCOUNTER — Telehealth: Payer: Self-pay | Admitting: Podiatry

## 2016-04-05 DIAGNOSIS — R52 Pain, unspecified: Secondary | ICD-10-CM

## 2016-04-05 NOTE — Telephone Encounter (Signed)
I called the patient and left him a message asking him to call me back in regards to his medical records request. I told him in the message they would be ready by 2:00 pm at the front desk today Monday 05 April 2016 and that we have moved to our new office. I provided him with our new address.

## 2016-04-07 ENCOUNTER — Encounter: Payer: Self-pay | Admitting: Family Medicine

## 2016-04-07 ENCOUNTER — Ambulatory Visit (INDEPENDENT_AMBULATORY_CARE_PROVIDER_SITE_OTHER): Payer: Medicare Other | Admitting: Family Medicine

## 2016-04-07 VITALS — BP 121/75 | HR 84 | Temp 99.0°F | Resp 18 | Ht 69.5 in | Wt 149.8 lb

## 2016-04-07 DIAGNOSIS — G894 Chronic pain syndrome: Secondary | ICD-10-CM

## 2016-04-07 DIAGNOSIS — F411 Generalized anxiety disorder: Secondary | ICD-10-CM | POA: Diagnosis not present

## 2016-04-07 MED ORDER — HYDROMORPHONE HCL 4 MG PO TABS: 4.0000 mg | ORAL_TABLET | ORAL | 0 refills | Status: DC | PRN

## 2016-04-07 NOTE — Progress Notes (Signed)
Duane [name] is a 56 y.o. male who presents to [facility] Care at [facility] today for pain medicatoins:  1.  Pain medications:  Patient has been followed by [facility] but states "I'm treated like a criminal there."  Would like to be referred to another pain clinic.  He is actively looking himself.  With baseline chronic pain, better with Dilaudid. Has been on same doses for years.  S/p multiple neck, foot surgeries.  Pain everyday.  Able to complete ADLs with his chronic narcotics.  Has some mild baseline nausea without vomiting.  No chest pain/palpitations.   2.  Anxiety:  Saw his PCP -- switched from Effexor to trazodone because he had some dizziness with Effexor. He has been doing well with the trazodone. He takes one tablet at night for sleep as well as anxiety. He is no longer taking Xanax at all. Is concerned about to drop her side effects as well as interactions with his narcotics.  No SI/HI.  No paresthesias.   ROS as above.   PMH reviewed. Patient is a nonsmoker.   Past Medical History:  Diagnosis Date  . Arthritis   . Gout   . Hypertension    Past Surgical History:  Procedure Laterality Date  . ELBOW SURGERY    . FOOT FASCIOTOMY     X 5  . HEMORRHOID SURGERY     X2, Dr. Arnoldo Morale  . NECK EXPLORATION    . SHOULDER ARTHROTOMY     multiple     Medications reviewed. Current Outpatient Prescriptions  Medication Sig Dispense Refill  . enalapril (VASOTEC) 10 MG tablet 10 mg tablet; oral every day    . HYDROmorphone (DILAUDID) 4 MG tablet Take 1 tablet (4 mg total) by mouth every 4 (four) hours as needed for severe pain. Do not fill until 11/24/15 180 tablet 0  . indomethacin (INDOCIN) 50 MG capsule Take 50 mg by mouth 3 (three) times daily as needed.    . traZODone (DESYREL) 50 MG tablet Take 50 mg by mouth at bedtime.    . ALPRAZolam (XANAX) 1 MG tablet Take 1 tab po daily x 3 days -- Do not fill until 11/24/15 (Patient not taking: Reported on 02/04/2016) 90 tablet 1  .  diclofenac (VOLTAREN) 75 MG EC tablet Take 75 mg by mouth 2 (two) times daily.    . promethazine (PHENERGAN) 25 MG tablet Take 0.5 tablets (12.5 mg total) by mouth every 6 (six) hours as needed for nausea. 20 tablet 0  . venlafaxine (EFFEXOR) 37.5 MG tablet Take 1 tablet (37.5 mg total) by mouth 2 (two) times daily. (Patient not taking: Reported on 04/07/2016) 60 tablet 1   No current facility-administered medications for this visit.      Physical Exam:  BP 121/75   Pulse 84   Temp 99 F (37.2 C) (Oral)   Resp 18   Ht 5' 9.5" (1.765 m)   Wt 149 lb 12.8 oz (67.9 kg)   SpO2 94%   BMI 21.80 kg/m  Gen:  Alert, cooperative patient who appears stated age in no acute distress.  Vital signs reviewed. HEENT: EOMI,  MMM Pulm:  Clear to auscultation bilaterally with good air movement.  No wheezes or rales noted.   Cardiac:  Regular rate and rhythm without murmur auscultated.  Good S1/S2. Exts: Right arm in chronic contracture and chronic muscle atrophy Psych:  Pleasant and conversant.  Some pressured speech but not tangential and able to stand topic. No SI/HI  Assessment and Plan:  1.  Chronic pain: - short term (1 month refill) while he looks for a new pain clinic - discussed we cannot do this long-term.  Also listed this on AVS.   - referring to another pain clinic. - [place] controlled substance database reviewed -- appropriate.      2.  Anxiety:  - continue trazodone. He has received this from another doctor in [place].  - DC Xanax as he has not been taking this.  I will remove from med list.   - commended him on this and am glad Trazodone is doing well for him.

## 2016-04-07 NOTE — Patient Instructions (Addendum)
  It was good to see you again today.  I have provided you a month's worth of pain medicine while you are looking for a pain clinic.  As you know, we can't provide this long-term.  We have referred you to a new pain clinic.    IF you received an x-ray today, you will receive an invoice from [facility]. Please contact [facility] at [phone] with questions or concerns regarding your invoice.   IF you received labwork today, you will receive an invoice from [facility]. Please contact [facility] at [phone] with questions or concerns regarding your invoice.   Our billing staff will not be able to assist you with questions regarding bills from these companies.  You will be contacted with the lab results as soon as they are available. The fastest way to get your results is to activate your My Chart account. Instructions are located on the last page of this paperwork. If you have not heard from [place] regarding the results in 2 weeks, please contact this office.

## 2016-04-08 NOTE — Telephone Encounter (Signed)
error 

## 2016-05-18 ENCOUNTER — Telehealth: Payer: Self-pay

## 2016-05-18 NOTE — Telephone Encounter (Signed)
Can complete when I'm back tomorrow.

## 2016-05-18 NOTE — Telephone Encounter (Signed)
Its part of his pain contract for [facility]

## 2016-05-18 NOTE — Telephone Encounter (Signed)
Pt dropped off pain med form to be completed by Dr Mingo Amber  Form placed in nurses box   Best phone for pt is [phone]

## 2016-05-19 NOTE — Telephone Encounter (Signed)
I have completed my section of this form and placed back in the nurse's box.

## 2016-05-19 NOTE — Telephone Encounter (Signed)
Forms received from nurses box. Pt informed forms ready for pick up

## 2016-07-02 ENCOUNTER — Telehealth: Payer: Self-pay

## 2016-07-02 ENCOUNTER — Encounter: Payer: Self-pay | Admitting: Emergency Medicine

## 2016-07-02 ENCOUNTER — Ambulatory Visit (INDEPENDENT_AMBULATORY_CARE_PROVIDER_SITE_OTHER): Payer: Medicare Other | Admitting: Emergency Medicine

## 2016-07-02 VITALS — BP 150/94 | HR 78 | Temp 99.0°F | Resp 16 | Ht 69.0 in | Wt 153.4 lb

## 2016-07-02 DIAGNOSIS — G4700 Insomnia, unspecified: Secondary | ICD-10-CM | POA: Diagnosis not present

## 2016-07-02 DIAGNOSIS — R110 Nausea: Secondary | ICD-10-CM | POA: Diagnosis not present

## 2016-07-02 DIAGNOSIS — I10 Essential (primary) hypertension: Secondary | ICD-10-CM | POA: Diagnosis not present

## 2016-07-02 DIAGNOSIS — G894 Chronic pain syndrome: Secondary | ICD-10-CM

## 2016-07-02 DIAGNOSIS — N529 Male erectile dysfunction, unspecified: Secondary | ICD-10-CM | POA: Insufficient documentation

## 2016-07-02 MED ORDER — PROMETHAZINE HCL 25 MG PO TABS: 12.5000 mg | ORAL_TABLET | Freq: Four times a day (QID) | ORAL | 3 refills | Status: DC | PRN

## 2016-07-02 MED ORDER — TADALAFIL 20 MG PO TABS: 10.0000 mg | ORAL_TABLET | ORAL | 11 refills | Status: DC | PRN

## 2016-07-02 MED ORDER — ENALAPRIL MALEATE 10 MG PO TABS: ORAL_TABLET | ORAL | 5 refills | Status: DC

## 2016-07-02 MED ORDER — DOXEPIN HCL 6 MG PO TABS: 1.0000 | ORAL_TABLET | Freq: Every evening | ORAL | 1 refills | Status: DC | PRN

## 2016-07-02 NOTE — Assessment & Plan Note (Signed)
Questionable compliance. High today.

## 2016-07-02 NOTE — Telephone Encounter (Signed)
Spoke with pharmacist. Given verbal order for changes in medications.

## 2016-07-02 NOTE — Assessment & Plan Note (Signed)
Opiate induced; refill Promethazine tablets.

## 2016-07-02 NOTE — Assessment & Plan Note (Signed)
Chronic; will try Silenor (Doxepin). No bad drug interaction found in UTD.

## 2016-07-02 NOTE — Progress Notes (Signed)
e

## 2016-07-02 NOTE — Assessment & Plan Note (Signed)
Requested and prescribed Cialis.

## 2016-07-02 NOTE — Patient Instructions (Signed)

## 2016-07-02 NOTE — Telephone Encounter (Signed)
Those changes are fine with me. Thanks.

## 2016-07-02 NOTE — Telephone Encounter (Signed)
Pharmacy called and wants to know if substitutions can be made to medications that were sent in. Pt has no drug coverage and some of the medications are to expensive for patient to afford. Pharmacists wants to know if it is okay to substitute generic brand for Cialis and maybe change the Doxepin 6mg  to possibly a 10mg  which may be cheaper for the patient or switching medication completely.

## 2016-07-02 NOTE — Progress Notes (Signed)
Duane [name] 56 y.o.   Chief Complaint  Patient presents with  . Establish Care    needs medication for Sleep  . Medication Refill    Promethazine  . Depression    positive answers in triage    HISTORY OF PRESENT ILLNESS: This is a 56 y.o. male with a h/o chronic pain syndrome under [facility] care's, also seeing psychiatrist; needs refill of Promethazine because he gets nauseous from the Dilaudid; also c/o insomnia, requesting some sleep medication; has h/o depression and has tried Trazodone in the past but not taking it now. Was seen by Dr. Mingo Amber who just recently left this practice.  HPI   Prior to Admission medications   Medication Sig Start Date End Date Taking? Authorizing Provider  indomethacin (INDOCIN) 50 MG capsule Take 50 mg by mouth 3 (three) times daily as needed.   Yes [provider]  diclofenac (VOLTAREN) 75 MG EC tablet Take 75 mg by mouth 2 (two) times daily.    [provider]  enalapril (VASOTEC) 10 MG tablet 10 mg tablet; oral every day    [provider]  HYDROmorphone (DILAUDID) 4 MG tablet Take 1 tablet (4 mg total) by mouth every 4 (four) hours as needed for severe pain. Do not fill until 11/24/15 Patient not taking: Reported on 07/02/2016 04/07/16   [name], MD  promethazine (PHENERGAN) 25 MG tablet Take 0.5 tablets (12.5 mg total) by mouth every 6 (six) hours as needed for nausea. 08/31/11 09/07/11  Prentiss Bells, MD  traZODone (DESYREL) 50 MG tablet Take 50 mg by mouth at bedtime.    [provider]    Allergies  Allergen Reactions  . Codeine   . Hydrocodone Nausea And Vomiting  . Levofloxacin   . Oxycodone Nausea And Vomiting  . Penicillins     Patient Active Problem List   Diagnosis Date Noted  . SLAP lesion of shoulder 08/04/2012  . Muscle weakness (generalized) 08/04/2012  . Chronic pain 08/31/2011  . Deformity, acquired 08/31/2011  . Hypertension 08/31/2011  . Insomnia 08/31/2011  .  Epigastric discomfort 05/03/2011  . LUMBAR SPRAIN AND STRAIN 10/22/2009  . CUBITAL TUNNEL SYNDROME 10/24/2007  . SHOULDER PAIN 10/24/2007  . 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 ELBOW PAIN 05/25/2007    Past Medical History:  Diagnosis Date  . Arthritis   . Gout   . Hypertension     Past Surgical History:  Procedure Laterality Date  . ELBOW SURGERY    . FOOT FASCIOTOMY     X 5  . HEMORRHOID SURGERY     X2, Dr. Arnoldo Morale  . NECK EXPLORATION    . SHOULDER ARTHROTOMY     multiple     Social History   Social History  . Marital status: Single    Spouse name: N/A  . Number of children: N/A  . 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 Smoking status: Never Smoker  . Smokeless tobacco: Never Used  . Alcohol use No  . Drug use: No  . Sexual activity: Not on file   Other Topics Concern  . Not on file   Social History Narrative  . No narrative on file    Family History  Problem Relation Age of Onset  . Diabetes Mother   . COPD Father   . Colon cancer Neg Hx  Review of Systems  Constitutional: Negative.  Negative for chills and fever.  HENT: Negative.  Negative for congestion, nosebleeds, sinus pain and sore throat.   Eyes: Negative.  Negative for blurred vision and double vision.  Respiratory: Negative.  Negative for cough and shortness of breath.   Cardiovascular: Negative.  Negative for chest pain, palpitations and leg swelling.  Gastrointestinal: Negative.  Negative for abdominal pain, diarrhea, nausea and vomiting.  Genitourinary: Negative.  Negative for dysuria and hematuria.  Skin: Negative.  Negative for rash.  Neurological: Negative.  Negative for dizziness and headaches.  Endo/Heme/Allergies: Negative.   Psychiatric/Behavioral: The patient has insomnia.   All other systems reviewed and are negative.  Vitals:   07/02/16 1323  BP: (!) 150/94  Pulse: 78  Resp:  16  Temp: 99 F (37.2 C)     Physical Exam  Constitutional: He is oriented to person, place, and time. He appears well-developed and well-nourished.  HENT:  Head: Normocephalic and atraumatic.  Nose: Nose normal.  Mouth/Throat: Oropharynx is clear and moist. No oropharyngeal exudate.  Eyes: Conjunctivae and EOM are normal. Pupils are equal, round, and reactive to light.  Neck: Normal range of motion. Neck supple. No JVD present. No thyromegaly present.  Cardiovascular: Normal rate, regular rhythm and normal heart sounds.   Pulmonary/Chest: Effort normal and breath sounds normal.  Abdominal: Soft. He exhibits no distension. There is no tenderness.  Musculoskeletal:  RUE: chronic contracture and chronic muscle atrophy.  Lymphadenopathy:    He has no cervical adenopathy.  Neurological: He is alert and oriented to person, place, and time.  Skin: Skin is warm and dry. Capillary refill takes less than 2 seconds. No rash noted.  Psychiatric: He has a normal mood and affect. His behavior is normal.  Vitals reviewed.    ASSESSMENT & PLAN: Problem List Items Addressed This Visit      Cardiovascular and Mediastinum   Essential hypertension    Questionable compliance. High today.      Relevant Medications   tadalafil (CIALIS) 20 MG tablet   enalapril (VASOTEC) 10 MG tablet     Genitourinary   Erectile dysfunction    Requested and prescribed Cialis.      Relevant Medications   tadalafil (CIALIS) 20 MG tablet     Other   Chronic pain    Under [facility]'s care; doing well.      Insomnia - Primary    Chronic; will try Silenor (Doxepin). No bad drug interaction found in UTD.      Relevant Medications   Doxepin HCl 6 MG TABS   Nausea without vomiting    Opiate induced; refill Promethazine tablets.      Relevant Medications   promethazine (PHENERGAN) 25 MG tablet     Patient Instructions  Insomnia Insomnia is a sleep disorder that makes it difficult to fall asleep  or to stay asleep. Insomnia can cause tiredness (fatigue), low energy, difficulty concentrating, mood swings, and poor performance at work or school. There are three different ways to classify insomnia:  Difficulty falling asleep.  Difficulty staying asleep.  Waking up too early in the morning.  Any type of insomnia can be long-term (chronic) or short-term (acute). Both are common. Short-term insomnia usually lasts for three months or less. Chronic insomnia occurs at least three times a week for longer than three months. What are the causes? Insomnia may be caused by another condition, situation, or substance, such as:  Anxiety.  Certain medicines.  Gastroesophageal reflux disease (  GERD) or other gastrointestinal conditions.  Asthma or other breathing conditions.  Restless legs syndrome, sleep apnea, or other sleep disorders.  Chronic pain.  Menopause. This may include hot flashes.  Stroke.  Abuse of alcohol, tobacco, or illegal drugs.  Depression.  Caffeine.  Neurological disorders, such as Alzheimer disease.  An overactive thyroid (hyperthyroidism).  The cause of insomnia may not be known. What increases the risk? Risk factors for insomnia include:  Gender. Women are more commonly affected than men.  Age. Insomnia is more common as you get older.  Stress. This may involve your professional or personal life.  Income. Insomnia is more common in people with lower income.  Lack of exercise.  Irregular work schedule or night shifts.  Traveling between different time zones.  What are the signs or symptoms? If you have insomnia, trouble falling asleep or trouble staying asleep is the main symptom. This may lead to other symptoms, such as:  Feeling fatigued.  Feeling nervous about going to sleep.  Not feeling rested in the morning.  Having trouble concentrating.  Feeling irritable, anxious, or depressed.  How is this treated? Treatment for insomnia  depends on the cause. If your insomnia is caused by an underlying condition, treatment will focus on addressing the condition. Treatment may also include:  Medicines to help you sleep.  Counseling or therapy.  Lifestyle adjustments.  Follow these instructions at home:  Take medicines only as directed by your health care provider.  Keep regular sleeping and waking hours. Avoid naps.  Keep a sleep diary to help you and your health care provider figure out what could be causing your insomnia. Include: ? When you sleep. ? When you wake up during the night. ? How well you sleep. ? How rested you feel the next day. ? Any side effects of medicines you are taking. ? What you eat and drink.  Make your bedroom a comfortable place where it is easy to fall asleep: ? Put up shades or special blackout curtains to block light from outside. ? Use a white noise machine to block noise. ? Keep the temperature cool.  Exercise regularly as directed by your health care provider. Avoid exercising right before bedtime.  Use relaxation techniques to manage stress. Ask your health care provider to suggest some techniques that may work well for you. These may include: ? Breathing exercises. ? Routines to release muscle tension. ? Visualizing peaceful scenes.  Cut back on alcohol, caffeinated beverages, and cigarettes, especially close to bedtime. These can disrupt your sleep.  Do not overeat or eat spicy foods right before bedtime. This can lead to digestive discomfort that can make it hard for you to sleep.  Limit screen use before bedtime. This includes: ? Watching TV. ? Using your smartphone, tablet, and computer.  Stick to a routine. This can help you fall asleep faster. Try to do a quiet activity, brush your teeth, and go to bed at the same time each night.  Get out of bed if you are still awake after 15 minutes of trying to sleep. Keep the lights down, but try reading or doing a quiet  activity. When you feel sleepy, go back to bed.  Make sure that you drive carefully. Avoid driving if you feel very sleepy.  Keep all follow-up appointments as directed by your health care provider. This is important. Contact a health care provider if:  You are tired throughout the day or have trouble in your daily routine due to sleepiness.  You continue to have sleep problems or your sleep problems get worse. Get help right away if:  You have serious thoughts about hurting yourself or someone else. This information is not intended to replace advice given to you by your health care provider. Make sure you discuss any questions you have with your health care provider. Document Released: 12/26/1999 Document Revised: 05/30/2015 Document Reviewed: 09/28/2013 Elsevier Interactive Patient Education  2018 Elsevier Inc.     Agustina Caroli, MD [facility]

## 2016-07-02 NOTE — Assessment & Plan Note (Signed)
Under [facility] care; doing well.

## 2016-08-12 ENCOUNTER — Encounter: Payer: Self-pay | Admitting: Emergency Medicine

## 2016-08-12 ENCOUNTER — Ambulatory Visit (INDEPENDENT_AMBULATORY_CARE_PROVIDER_SITE_OTHER): Payer: Medicare Other | Admitting: Emergency Medicine

## 2016-08-12 VITALS — BP 145/87 | HR 69 | Temp 98.8°F | Resp 16 | Ht 70.5 in | Wt 156.6 lb

## 2016-08-12 DIAGNOSIS — G894 Chronic pain syndrome: Secondary | ICD-10-CM | POA: Diagnosis not present

## 2016-08-12 DIAGNOSIS — F32A Depression, unspecified: Secondary | ICD-10-CM

## 2016-08-12 DIAGNOSIS — F329 Major depressive disorder, single episode, unspecified: Secondary | ICD-10-CM

## 2016-08-12 DIAGNOSIS — I10 Essential (primary) hypertension: Secondary | ICD-10-CM | POA: Diagnosis not present

## 2016-08-12 DIAGNOSIS — N486 Induration penis plastica: Secondary | ICD-10-CM

## 2016-08-12 MED ORDER — ESCITALOPRAM OXALATE 5 MG PO TABS: 5.0000 mg | ORAL_TABLET | Freq: Every day | ORAL | 3 refills | Status: DC

## 2016-08-12 NOTE — Progress Notes (Signed)
Duane [name] 56 y.o.   Chief Complaint  Patient presents with  . Referral    Urologist - Peronies disease  . Depression    answers =22 points    HISTORY OF PRESENT ILLNESS: This is a 56 y.o. male complaining of depression brought on by penis condition he feels is Peyronie's disease; made appointment with Dr. Karsten [name] (Urologist) for 10/05/16. Has been on antidepressants before; not suicidal but nothing brings him pressure. Depression screen [facility] 2/9 08/12/2016 07/02/2016 04/07/2016 02/04/2016 10/30/2015  Decreased Interest 3 1 3 2  0  Down, Depressed, Hopeless 3 1 3 3  0  PHQ - 2 Score 6 2 6 5  0  Altered sleeping 3 3 3 3  -  Tired, decreased energy 3 2 3 1  -  Change in appetite 3 2 3 2  -  Feeling bad or failure about yourself  3 2 1 3  -  Trouble concentrating 1 0 0 0 -  Moving slowly or fidgety/restless 3 0 0 0 -  Suicidal thoughts 3 0 0 0 -  PHQ-9 Score 25 11 16 14  -  Difficult doing work/chores - - - Not difficult at all -     HPI   Prior to Admission medications   Medication Sig Start Date End Date Taking? Authorizing Provider  enalapril (VASOTEC) 10 MG tablet 10 mg tablet; oral every day 07/02/16  Yes Duane [name], Duane Bloomer, MD  HYDROmorphone (DILAUDID) 4 MG tablet Take 1 tablet (4 mg total) by mouth every 4 (four) hours as needed for severe pain. Do not fill until 11/24/15 04/07/16  Yes Duane Reasons, MD  indomethacin (INDOCIN) 50 MG capsule Take 50 mg by mouth 3 (three) times daily as needed.   Yes [provider]  tadalafil (CIALIS) 20 MG tablet Take 0.5-1 tablets (10-20 mg total) by mouth every other day as needed for erectile dysfunction. 07/02/16  Yes Duane [name], Duane Bloomer, MD  diclofenac (VOLTAREN) 75 MG EC tablet Take 75 mg by mouth 2 (two) times daily.    [provider]  Doxepin HCl 6 MG TABS Take 1 tablet (6 mg total) by mouth at bedtime as needed. Patient not taking: Reported on 08/12/2016 07/02/16   Duane Pollen, MD  promethazine (PHENERGAN) 25  MG tablet Take 0.5 tablets (12.5 mg total) by mouth every 6 (six) hours as needed for nausea. 07/02/16 08/01/16  Duane Pollen, MD  traZODone (DESYREL) 50 MG tablet Take 50 mg by mouth at bedtime.    [provider]    Allergies  Allergen Reactions  . Codeine   . Hydrocodone Nausea And Vomiting  . Levofloxacin   . Oxycodone Nausea And Vomiting  . Penicillins     Patient Active Problem List   Diagnosis Date Noted  . Nausea without vomiting 07/02/2016  . Erectile dysfunction 07/02/2016  . SLAP lesion of shoulder 08/04/2012  . Muscle weakness (generalized) 08/04/2012  . Chronic pain 08/31/2011  . Deformity, acquired 08/31/2011  . Essential hypertension 08/31/2011  . Insomnia 08/31/2011  . Epigastric discomfort 05/03/2011  . LUMBAR SPRAIN AND STRAIN 10/22/2009  . CUBITAL TUNNEL SYNDROME 10/24/2007  . SHOULDER PAIN 10/24/2007  . 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 ELBOW PAIN 05/25/2007    Past Medical History:  Diagnosis Date  . Arthritis   . Gout   . Hypertension     Past Surgical History:  Procedure Laterality Date  . ELBOW SURGERY    . FOOT FASCIOTOMY     X 5  . HEMORRHOID  SURGERY     X2, Dr. Arnoldo [name]  . NECK EXPLORATION    . SHOULDER ARTHROTOMY     multiple     Social History   Social History  . Marital status: Single    Spouse name: N/A  . Number of children: N/A  . 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 Smoking status: Never Smoker  . Smokeless tobacco: Never Used  . Alcohol use No  . Drug use: No  . Sexual activity: Not on file   Other Topics Concern  . Not on file   Social History Narrative  . No narrative on file    Family History  Problem Relation Age of Onset  . Diabetes Mother   . COPD Father   . Colon cancer Neg Hx      Review of Systems  Constitutional: Negative.  Negative for chills and fever.  HENT: Negative.   Eyes:  Negative.  Negative for blurred vision, discharge and redness.  Respiratory: Negative.  Negative for cough and shortness of breath.   Cardiovascular: Negative.  Negative for chest pain and palpitations.  Gastrointestinal: Negative.  Negative for abdominal pain, blood in stool, diarrhea, melena, nausea and vomiting.  Genitourinary: Negative.  Negative for dysuria and hematuria.  Skin: Negative.  Negative for rash.  Neurological: Negative for dizziness, sensory change, focal weakness, seizures and headaches.  Endo/Heme/Allergies: Negative.   Psychiatric/Behavioral: Positive for depression. The patient has insomnia.   All other systems reviewed and are negative.  Vitals:   08/12/16 0844 08/12/16 0848  BP: (!) 156/94 (!) 145/87  Pulse: 69   Resp: 16   Temp: 98.8 F (37.1 C)      Physical Exam  Constitutional: He is oriented to person, place, and time. He appears well-developed and well-nourished.  HENT:  Head: Normocephalic and atraumatic.  Right Ear: External ear normal.  Left Ear: External ear normal.  Mouth/Throat: Oropharynx is clear and moist.  Eyes: Pupils are equal, round, and reactive to light. Conjunctivae and EOM are normal.  Neck: Normal range of motion. Neck supple. No JVD present. No thyromegaly present.  Cardiovascular: Normal rate, regular rhythm, normal heart sounds and intact distal pulses.   Pulmonary/Chest: Effort normal and breath sounds normal.  Abdominal: Soft. Bowel sounds are normal. He exhibits no distension. There is no tenderness.  Musculoskeletal:  Right arm with chronic contraction and muscle atrophy.  Lymphadenopathy:    He has no cervical adenopathy.  Neurological: He is alert and oriented to person, place, and time. He displays normal reflexes. No sensory deficit. He exhibits normal muscle tone.  Skin: Skin is warm and dry. Capillary refill takes less than 2 seconds. No rash noted.  Psychiatric: He has a normal mood and affect. His behavior is  normal.  Vitals reviewed.    ASSESSMENT & PLAN: Duane [name] was seen today for referral and depression.  Diagnoses and all orders for this visit:  Peyronie's syndrome  Depression, unspecified depression type -     escitalopram (LEXAPRO) 5 MG tablet; Take 1 tablet (5 mg total) by mouth daily.  Chronic pain syndrome  Essential hypertension  Other orders -     Cancel: Tdap vaccine greater than or equal to 7yo IM    Patient Instructions       IF you received an x-ray today, you will receive an invoice from [facility]. Please contact [facility] at [phone] with questions  or concerns regarding your invoice.   IF you received labwork today, you will receive an invoice from [facility]. Please contact [facility] at [phone] with questions or concerns regarding your invoice.   Our billing staff will not be able to assist you with questions regarding bills from these companies.  You will be contacted with the lab results as soon as they are available. The fastest way to get your results is to activate your My Chart account. Instructions are located on the last page of this paperwork. If you have not heard from [place] regarding the results in 2 weeks, please contact this office.     Major Depressive Disorder, Adult Major depressive disorder (MDD) is a mental health condition. MDD often makes you feel sad, hopeless, or helpless. MDD can also cause symptoms in your body. MDD can affect your:  Work.  School.  Relationships.  Other normal activities.  MDD can range from mild to very bad. It may occur once (single episode MDD). It can also occur many times (recurrent MDD). The main symptoms of MDD often include:  Feeling sad, depressed, or irritable most of the time.  Loss of interest.  MDD symptoms also include:  Sleeping too much or too little.  Eating too much or too little.  A change in your weight.  Feeling tired (fatigue) or having low  energy.  Feeling worthless.  Feeling guilty.  Trouble making decisions.  Trouble thinking clearly.  Thoughts of suicide or harming others.  Feeling weak.  Feeling agitated.  Keeping yourself from being around other people (isolation).  Follow these instructions at home: Activity  Do these things as told by your doctor: ? Go back to your normal activities. ? Exercise regularly. ? Spend time outdoors. Alcohol  Talk with your doctor about how alcohol can affect your antidepressant medicines.  Do not drink alcohol. Or, limit how much alcohol you drink. ? This means no more than 1 drink a day for nonpregnant women and 2 drinks a day for men. One drink equals one of these:  12 oz of beer.  5 oz of wine.  1 oz of hard liquor. General instructions  Take over-the-counter and prescription medicines only as told by your doctor.  Eat a healthy diet.  Get plenty of sleep.  Find activities that you enjoy. Make time to do them.  Think about joining a support group. Your doctor may be able to suggest a group for you.  Keep all follow-up visits as told by your doctor. This is important. Where to find more information:  [employer] on Mental Illness: ? www.nami.[place]: ? https://carter.com/  National Suicide Prevention Lifeline: ? 202-056-9263. This is free, 24-hour help. Contact a doctor if:  Your symptoms get worse.  You have new symptoms. Get help right away if:  You self-harm.  You see, hear, taste, smell, or feel things that are not present (hallucinate). If you ever feel like you may hurt yourself or others, or have thoughts about taking your own life, get help right away. You can go to your nearest emergency department or call:  Your local emergency services (911 in the [place]  A suicide crisis helpline, such as the National Suicide Prevention Lifeline: ? 617-319-9005. This is open 24 hours a day.  This  information is not intended to replace advice given to you by your health care provider. Make sure you discuss any questions you have with your health care provider. Document Released: 12/09/2014  Document Revised: 09/14/2015 Document Reviewed: 09/14/2015 Elsevier Interactive Patient Education  2017 Elsevier Inc.      Agustina Caroli, MD [facility]

## 2016-08-12 NOTE — Patient Instructions (Addendum)
   IF you received an x-ray today, you will receive an invoice from [facility]. Please contact [facility] at [phone] with questions or concerns regarding your invoice.   IF you received labwork today, you will receive an invoice from [facility]. Please contact [facility] at [phone] with questions or concerns regarding your invoice.   Our billing staff will not be able to assist you with questions regarding bills from these companies.  You will be contacted with the lab results as soon as they are available. The fastest way to get your results is to activate your My Chart account. Instructions are located on the last page of this paperwork. If you have not heard from us regarding the results in 2 weeks, please contact this office.      Major Depressive Disorder, Adult Major depressive disorder (MDD) is a mental health condition. MDD often makes you feel sad, hopeless, or helpless. MDD can also cause symptoms in your body. MDD can affect your:  Work.  School.  Relationships.  Other normal activities.  MDD can range from mild to very bad. It may occur once (single episode MDD). It can also occur many times (recurrent MDD). The main symptoms of MDD often include:  Feeling sad, depressed, or irritable most of the time.  Loss of interest.  MDD symptoms also include:  Sleeping too much or too little.  Eating too much or too little.  A change in your weight.  Feeling tired (fatigue) or having low energy.  Feeling worthless.  Feeling guilty.  Trouble making decisions.  Trouble thinking clearly.  Thoughts of suicide or harming others.  Feeling weak.  Feeling agitated.  Keeping yourself from being around other people (isolation).  Follow these instructions at home: Activity  Do these things as told by your doctor: ? Go back to your normal activities. ? Exercise regularly. ? Spend time outdoors. Alcohol  Talk with your doctor about  how alcohol can affect your antidepressant medicines.  Do not drink alcohol. Or, limit how much alcohol you drink. ? This means no more than 1 drink a day for nonpregnant women and 2 drinks a day for men. One drink equals one of these:  12 oz of beer.  5 oz of wine.  1 oz of hard liquor. General instructions  Take over-the-counter and prescription medicines only as told by your doctor.  Eat a healthy diet.  Get plenty of sleep.  Find activities that you enjoy. Make time to do them.  Think about joining a support group. Your doctor may be able to suggest a group for you.  Keep all follow-up visits as told by your doctor. This is important. Where to find more information:  National Alliance on Mental Illness: ? www.nami.org  [place] National Institute of Mental Health: ? www.nimh.nih.gov  National Suicide Prevention Lifeline: ? 1-800-273-8255. This is free, 24-hour help. Contact a doctor if:  Your symptoms get worse.  You have new symptoms. Get help right away if:  You self-harm.  You see, hear, taste, smell, or feel things that are not present (hallucinate). If you ever feel like you may hurt yourself or others, or have thoughts about taking your own life, get help right away. You can go to your nearest emergency department or call:  Your local emergency services (911 in the [place]  A suicide crisis helpline, such as the National Suicide Prevention Lifeline: ? 1-800-273-8255. This is open 24 hours a day.  This information is not intended to replace   advice given to you by your health care provider. Make sure you discuss any questions you have with your health care provider. Document Released: 12/09/2014 Document Revised: 09/14/2015 Document Reviewed: 09/14/2015 Elsevier Interactive Patient Education  2017 Elsevier Inc.  

## 2016-08-14 ENCOUNTER — Telehealth: Payer: Self-pay | Admitting: *Deleted

## 2016-08-14 NOTE — Telephone Encounter (Signed)
Faxed refill request back to pharmacy because of scratch outs and request another form. Confirmation page received.

## 2016-08-17 ENCOUNTER — Other Ambulatory Visit: Payer: Self-pay | Admitting: Emergency Medicine

## 2016-08-18 NOTE — Telephone Encounter (Signed)
Recently started on Lexapro. Not a good combination with Doxepin. Bad interaction. Do not refill.

## 2016-08-18 NOTE — Telephone Encounter (Signed)
Please advise 

## 2016-08-24 ENCOUNTER — Encounter (HOSPITAL_COMMUNITY): Payer: Self-pay

## 2016-08-24 ENCOUNTER — Emergency Department (HOSPITAL_COMMUNITY): Payer: Medicare Other

## 2016-08-24 ENCOUNTER — Inpatient Hospital Stay (HOSPITAL_COMMUNITY)
Admission: EM | Admit: 2016-08-24 | Discharge: 2016-08-28 | DRG: 871 | Discharge status: Against Medical Advice | Payer: Medicare Other | Attending: Internal Medicine | Admitting: Internal Medicine

## 2016-08-24 DIAGNOSIS — J189 Pneumonia, unspecified organism: Secondary | ICD-10-CM | POA: Diagnosis not present

## 2016-08-24 DIAGNOSIS — A4150 Gram-negative sepsis, unspecified: Secondary | ICD-10-CM

## 2016-08-24 DIAGNOSIS — Z885 Allergy status to narcotic agent status: Secondary | ICD-10-CM

## 2016-08-24 DIAGNOSIS — Z880 Allergy status to penicillin: Secondary | ICD-10-CM

## 2016-08-24 DIAGNOSIS — R7881 Bacteremia: Secondary | ICD-10-CM

## 2016-08-24 DIAGNOSIS — E876 Hypokalemia: Secondary | ICD-10-CM | POA: Diagnosis present

## 2016-08-24 DIAGNOSIS — Z881 Allergy status to other antibiotic agents status: Secondary | ICD-10-CM

## 2016-08-24 DIAGNOSIS — J181 Lobar pneumonia, unspecified organism: Secondary | ICD-10-CM

## 2016-08-24 DIAGNOSIS — E861 Hypovolemia: Secondary | ICD-10-CM | POA: Diagnosis present

## 2016-08-24 DIAGNOSIS — R509 Fever, unspecified: Secondary | ICD-10-CM

## 2016-08-24 DIAGNOSIS — Z833 Family history of diabetes mellitus: Secondary | ICD-10-CM

## 2016-08-24 DIAGNOSIS — R739 Hyperglycemia, unspecified: Secondary | ICD-10-CM | POA: Diagnosis present

## 2016-08-24 DIAGNOSIS — R55 Syncope and collapse: Secondary | ICD-10-CM | POA: Diagnosis present

## 2016-08-24 DIAGNOSIS — M542 Cervicalgia: Secondary | ICD-10-CM | POA: Diagnosis present

## 2016-08-24 DIAGNOSIS — G894 Chronic pain syndrome: Secondary | ICD-10-CM | POA: Diagnosis not present

## 2016-08-24 DIAGNOSIS — Z79899 Other long term (current) drug therapy: Secondary | ICD-10-CM

## 2016-08-24 DIAGNOSIS — I10 Essential (primary) hypertension: Secondary | ICD-10-CM | POA: Diagnosis present

## 2016-08-24 DIAGNOSIS — R7302 Impaired glucose tolerance (oral): Secondary | ICD-10-CM | POA: Diagnosis present

## 2016-08-24 DIAGNOSIS — A419 Sepsis, unspecified organism: Principal | ICD-10-CM

## 2016-08-24 DIAGNOSIS — G8929 Other chronic pain: Secondary | ICD-10-CM

## 2016-08-24 DIAGNOSIS — M549 Dorsalgia, unspecified: Secondary | ICD-10-CM

## 2016-08-24 DIAGNOSIS — Z888 Allergy status to other drugs, medicaments and biological substances status: Secondary | ICD-10-CM

## 2016-08-24 LAB — PROTIME-INR
INR: 1.1
Prothrombin Time: 14.2 seconds (ref 11.4–15.2)

## 2016-08-24 LAB — URINALYSIS, ROUTINE W REFLEX MICROSCOPIC
BILIRUBIN URINE: NEGATIVE
GLUCOSE, UA: NEGATIVE mg/dL
Hgb urine dipstick: NEGATIVE
KETONES UR: NEGATIVE mg/dL
LEUKOCYTES UA: NEGATIVE
Nitrite: NEGATIVE
PROTEIN: NEGATIVE mg/dL
Specific Gravity, Urine: 1.018 (ref 1.005–1.030)
pH: 5 (ref 5.0–8.0)

## 2016-08-24 LAB — COMPREHENSIVE METABOLIC PANEL
ALK PHOS: 54 U/L (ref 38–126)
ALT: 18 U/L (ref 17–63)
ANION GAP: 9 (ref 5–15)
AST: 25 U/L (ref 15–41)
Albumin: 3.5 g/dL (ref 3.5–5.0)
BUN: 13 mg/dL (ref 6–20)
CO2: 21 mmol/L — AB (ref 22–32)
Calcium: 7.9 mg/dL — ABNORMAL LOW (ref 8.9–10.3)
Chloride: 103 mmol/L (ref 101–111)
Creatinine, Ser: 1.17 mg/dL (ref 0.61–1.24)
Glucose, Bld: 198 mg/dL — ABNORMAL HIGH (ref 65–99)
Potassium: 3.4 mmol/L — ABNORMAL LOW (ref 3.5–5.1)
SODIUM: 133 mmol/L — AB (ref 135–145)
Total Bilirubin: 1.1 mg/dL (ref 0.3–1.2)
Total Protein: 6.9 g/dL (ref 6.5–8.1)

## 2016-08-24 LAB — CBC WITH DIFFERENTIAL/PLATELET
Basophils Absolute: 0 10*3/uL (ref 0.0–0.1)
Basophils Relative: 0 %
EOS ABS: 0 10*3/uL (ref 0.0–0.7)
EOS PCT: 0 %
HCT: 35.9 % — ABNORMAL LOW (ref 39.0–52.0)
HEMOGLOBIN: 12.4 g/dL — AB (ref 13.0–17.0)
LYMPHS ABS: 0.7 10*3/uL (ref 0.7–4.0)
Lymphocytes Relative: 8 %
MCH: 30.7 pg (ref 26.0–34.0)
MCHC: 34.5 g/dL (ref 30.0–36.0)
MCV: 88.9 fL (ref 78.0–100.0)
MONOS PCT: 8 %
Monocytes Absolute: 0.7 10*3/uL (ref 0.1–1.0)
NEUTROS ABS: 7.4 10*3/uL (ref 1.7–7.7)
Neutrophils Relative %: 84 %
PLATELETS: 213 10*3/uL (ref 150–400)
RBC: 4.04 MIL/uL — ABNORMAL LOW (ref 4.22–5.81)
RDW: 12.9 % (ref 11.5–15.5)
WBC: 8.8 10*3/uL (ref 4.0–10.5)

## 2016-08-24 LAB — LIPASE, BLOOD: LIPASE: 24 U/L (ref 11–51)

## 2016-08-24 LAB — I-STAT TROPONIN, ED: TROPONIN I, POC: 0 ng/mL (ref 0.00–0.08)

## 2016-08-24 LAB — APTT: aPTT: 33 seconds (ref 24–36)

## 2016-08-24 LAB — CBG MONITORING, ED: Glucose-Capillary: 185 mg/dL — ABNORMAL HIGH (ref 65–99)

## 2016-08-24 LAB — TSH: TSH: 1.35 u[IU]/mL (ref 0.350–4.500)

## 2016-08-24 LAB — I-STAT CG4 LACTIC ACID, ED
LACTIC ACID, VENOUS: 2.46 mmol/L — AB (ref 0.5–1.9)
LACTIC ACID, VENOUS: 0.8 mmol/L (ref 0.5–1.9)

## 2016-08-24 LAB — PROCALCITONIN: PROCALCITONIN: 0.11 ng/mL

## 2016-08-24 LAB — LACTIC ACID, PLASMA: LACTIC ACID, VENOUS: 1.1 mmol/L (ref 0.5–1.9)

## 2016-08-24 MED ORDER — HYDROMORPHONE HCL 4 MG PO TABS
4.0000 mg | ORAL_TABLET | Freq: Four times a day (QID) | ORAL | Status: DC | PRN
  Administered 2016-08-24 – 2016-08-27 (×6): 4 mg via ORAL
  Filled 2016-08-24 (×6): qty 1

## 2016-08-24 MED ORDER — ENOXAPARIN SODIUM 40 MG/0.4ML ~~LOC~~ SOLN
40.0000 mg | SUBCUTANEOUS | Status: DC
  Administered 2016-08-24 – 2016-08-26 (×2): 40 mg via SUBCUTANEOUS
  Filled 2016-08-24 (×4): qty 0.4

## 2016-08-24 MED ORDER — PIPERACILLIN-TAZOBACTAM 3.375 G IVPB 30 MIN
3.3750 g | Freq: Once | INTRAVENOUS | Status: AC
  Administered 2016-08-24: 3.375 g via INTRAVENOUS
  Filled 2016-08-24: qty 50

## 2016-08-24 MED ORDER — SODIUM CHLORIDE 0.9 % IV BOLUS (SEPSIS)
1000.0000 mL | Freq: Once | INTRAVENOUS | Status: AC
  Administered 2016-08-24: 1000 mL via INTRAVENOUS

## 2016-08-24 MED ORDER — DIPHENHYDRAMINE HCL 25 MG PO CAPS
50.0000 mg | ORAL_CAPSULE | Freq: Once | ORAL | Status: AC
Start: 2016-08-24 — End: 2016-08-24
  Administered 2016-08-24: 50 mg via ORAL
  Filled 2016-08-24: qty 2

## 2016-08-24 MED ORDER — VANCOMYCIN HCL IN DEXTROSE 1-5 GM/200ML-% IV SOLN
1000.0000 mg | Freq: Once | INTRAVENOUS | Status: AC
  Administered 2016-08-24: 1000 mg via INTRAVENOUS
  Filled 2016-08-24: qty 200

## 2016-08-24 MED ORDER — VANCOMYCIN HCL IN DEXTROSE 1-5 GM/200ML-% IV SOLN
1000.0000 mg | Freq: Two times a day (BID) | INTRAVENOUS | Status: DC
  Administered 2016-08-25: 1000 mg via INTRAVENOUS
  Filled 2016-08-24: qty 200

## 2016-08-24 MED ORDER — ENALAPRIL MALEATE 5 MG PO TABS
10.0000 mg | ORAL_TABLET | Freq: Every day | ORAL | Status: DC
  Administered 2016-08-25 – 2016-08-27 (×3): 10 mg via ORAL
  Filled 2016-08-24 (×3): qty 2

## 2016-08-24 MED ORDER — ZOLPIDEM TARTRATE 5 MG PO TABS
5.0000 mg | ORAL_TABLET | Freq: Every evening | ORAL | Status: DC | PRN
  Administered 2016-08-24 – 2016-08-27 (×4): 5 mg via ORAL
  Filled 2016-08-24 (×4): qty 1

## 2016-08-24 MED ORDER — SODIUM CHLORIDE 0.9 % IV SOLN
INTRAVENOUS | Status: DC
  Administered 2016-08-24: 22:00:00 via INTRAVENOUS

## 2016-08-24 MED ORDER — SODIUM CHLORIDE 0.9% FLUSH
3.0000 mL | Freq: Two times a day (BID) | INTRAVENOUS | Status: DC
  Administered 2016-08-25 – 2016-08-27 (×4): 3 mL via INTRAVENOUS

## 2016-08-24 MED ORDER — PIPERACILLIN-TAZOBACTAM 3.375 G IVPB
3.3750 g | Freq: Three times a day (TID) | INTRAVENOUS | Status: DC
  Administered 2016-08-24 – 2016-08-27 (×8): 3.375 g via INTRAVENOUS
  Filled 2016-08-24 (×8): qty 50

## 2016-08-24 MED ORDER — PROMETHAZINE HCL 12.5 MG PO TABS
12.5000 mg | ORAL_TABLET | Freq: Four times a day (QID) | ORAL | Status: DC | PRN
  Administered 2016-08-24 – 2016-08-27 (×6): 12.5 mg via ORAL
  Filled 2016-08-24 (×6): qty 1

## 2016-08-24 MED ORDER — DEXTROMETHORPHAN-GUAIFENESIN 10-100 MG/5ML PO LIQD
10.0000 mL | ORAL | Status: DC | PRN
  Filled 2016-08-24: qty 10

## 2016-08-24 MED ORDER — LACTATED RINGERS IV SOLN: INTRAVENOUS | Status: DC

## 2016-08-24 MED ORDER — ONDANSETRON HCL 4 MG/2ML IJ SOLN
4.0000 mg | Freq: Four times a day (QID) | INTRAMUSCULAR | Status: DC | PRN
  Administered 2016-08-24: 4 mg via INTRAVENOUS
  Filled 2016-08-24: qty 2

## 2016-08-24 MED ORDER — ONDANSETRON HCL 4 MG/2ML IJ SOLN
INTRAMUSCULAR | Status: AC
  Filled 2016-08-24: qty 2

## 2016-08-24 MED ORDER — ACETAMINOPHEN 325 MG PO TABS
650.0000 mg | ORAL_TABLET | Freq: Once | ORAL | Status: AC
  Administered 2016-08-24: 650 mg via ORAL
  Filled 2016-08-24: qty 2

## 2016-08-24 MED ORDER — HYDROMORPHONE HCL 4 MG PO TABS: 4.0000 mg | ORAL_TABLET | ORAL | Status: DC | PRN

## 2016-08-24 MED ORDER — LORATADINE 10 MG PO TABS
10.0000 mg | ORAL_TABLET | Freq: Every day | ORAL | Status: DC
  Administered 2016-08-24 – 2016-08-27 (×2): 10 mg via ORAL
  Filled 2016-08-24 (×4): qty 1

## 2016-08-24 MED ORDER — ACETAMINOPHEN 325 MG PO TABS
650.0000 mg | ORAL_TABLET | Freq: Four times a day (QID) | ORAL | Status: DC | PRN
  Administered 2016-08-24: 650 mg via ORAL

## 2016-08-24 MED ORDER — ACETAMINOPHEN 325 MG PO TABS
ORAL_TABLET | ORAL | Status: AC
  Filled 2016-08-24: qty 2

## 2016-08-24 NOTE — Progress Notes (Signed)
Pharmacy Note:  Initial antibiotics for Vancomycin and Zosyn ordered by EDP for sepsis.  CrCl cannot be calculated (Patient's most recent lab result is older than the maximum 21 days allowed.).   Allergies  Allergen Reactions  . Codeine   . Hydrocodone Nausea And Vomiting  . Levofloxacin   . Oxycodone Nausea And Vomiting  . Penicillins     Vitals:   08/24/16 1515 08/24/16 1530  BP:  105/84  Pulse: 99 94  Resp: 14 (!) 25  Temp:    SpO2: 94% 95%    Anti-infectives    Start     Dose/Rate Route Frequency Ordered Stop   08/24/16 1615  piperacillin-tazobactam (ZOSYN) IVPB 3.375 g     3.375 g 100 mL/hr over 30 Minutes Intravenous  Once 08/24/16 1601     08/24/16 1615  vancomycin (VANCOCIN) IVPB 1000 mg/200 mL premix     1,000 mg 200 mL/hr over 60 Minutes Intravenous  Once 08/24/16 1601       Plan: Initial doses of Vancomycin 1gm and Zosyn 3.375gm  X 1 ordered to be given in ED. F/U admission orders for further dosing if therapy continued.  Alvis, Edgell, [facility] 08/24/2016 4:08 PM

## 2016-08-24 NOTE — ED Provider Notes (Signed)
Emergency Department Provider Note   I have reviewed the triage vital signs and the nursing notes.   HISTORY  Chief Complaint Near Syncope   HPI Duane [name] is a 56 y.o. male with PMH of HTN presents to the emergency department for evaluation after syncope episode at the barbershop. Patient states she's felt fatigued over the past 2 days with intermittent severe coughing. No CP. The patient was leaning back to have his haircut when he suddenly felt very lightheaded. Patient states that he laid down and put his feet up in the air. EMS was called. He denies any heart palpitations or shortness of breath. He went on to have a syncopal event with some vomiting. No history of syncope in past. No vertigo. No weakness or numbness. Patient took his medications this morning as he typically does. Does note some intermittent HAs but nothing sudden onset or severe.    Past Medical History:  Diagnosis Date  . Arthritis   . Gout   . Hypertension     Patient Active Problem List   Diagnosis Date Noted  . Syncope 08/24/2016  . Sepsis due to pneumonia ([place]) 08/24/2016  . Hyperglycemia 08/24/2016  . Nausea without vomiting 07/02/2016  . Erectile dysfunction 07/02/2016  . SLAP lesion of shoulder 08/04/2012  . Muscle weakness (generalized) 08/04/2012  . Chronic pain 08/31/2011  . Deformity, acquired 08/31/2011  . Essential hypertension 08/31/2011  . Insomnia 08/31/2011  . Epigastric discomfort 05/03/2011  . LUMBAR SPRAIN AND STRAIN 10/22/2009  . CUBITAL TUNNEL SYNDROME 10/24/2007  . SHOULDER PAIN 10/24/2007  . 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 ELBOW PAIN 05/25/2007    Past Surgical History:  Procedure Laterality Date  . ELBOW SURGERY    . FOOT FASCIOTOMY     X 5  . HEMORRHOID SURGERY     X2, Dr. Arnoldo Morale  . NECK EXPLORATION    . SHOULDER ARTHROTOMY     multiple       Allergies Codeine; Escitalopram; Hydrocodone; Oxycodone; Penicillins; and Levofloxacin  Family  History  Problem Relation Age of Onset  . Diabetes Mother   . COPD Father   . Colon cancer Neg Hx     Social History Social History  Substance Use Topics  . Smoking status: Never Smoker  . Smokeless tobacco: Never Used  . Alcohol use No    Review of Systems  Constitutional: No fever/chills Eyes: No visual changes. ENT: No sore throat. Cardiovascular: Denies chest pain. Positive syncope.  Respiratory: Denies shortness of breath. Positive cough.  Gastrointestinal: No abdominal pain.  No nausea, no vomiting.  No diarrhea.  No constipation. Genitourinary: Negative for dysuria. Musculoskeletal: Negative for back pain. Skin: Negative for rash. Neurological: Negative for headaches, focal weakness or numbness.  10-point ROS otherwise negative.  ____________________________________________   PHYSICAL EXAM:  VITAL SIGNS: ED Triage Vitals  Enc Vitals Group     BP 08/24/16 1424 137/84     Pulse Rate 08/24/16 1424 (!) 109     Resp 08/24/16 1424 20     Temp 08/24/16 1424 (!) 102.7 F (39.3 C)     Temp Source 08/24/16 1424 Oral     SpO2 08/24/16 1424 95 %     Weight 08/24/16 1422 155 lb (70.3 kg)     Height 08/24/16 1422 5\' 9"  (1.753 m)     Pain Score 08/24/16 1421 3   Constitutional: Alert and oriented. Well appearing and in no acute distress. Eyes: Conjunctivae are normal. PERRL.  Head:  Atraumatic. Nose: No congestion/rhinnorhea. Mouth/Throat: Mucous membranes are dry.  Neck: No stridor.  No meningeal signs.   Cardiovascular: Sinus tachycardia. Good peripheral circulation. Grossly normal heart sounds.   Respiratory: Normal respiratory effort.  No retractions. Lungs CTAB. Gastrointestinal: Soft and nontender. No distention.  Musculoskeletal: No lower extremity tenderness nor edema. No gross deformities of extremities. Basel;ine RUE deformity. Neurologic:  Normal speech and language. No gross focal neurologic deficits are appreciated. Normal CN exam 2-12.  Skin:  Skin is  warm, dry and intact. No rash noted. Psychiatric: Mood and affect are normal. Speech and behavior are normal.  ____________________________________________   LABS (all labs ordered are listed, but only abnormal results are displayed)  Labs Reviewed  URINALYSIS, ROUTINE W REFLEX MICROSCOPIC - Abnormal; Notable for the following:       Result Value   Color, Urine AMBER (*)    All other components within normal limits  CBC WITH DIFFERENTIAL/PLATELET - Abnormal; Notable for the following:    RBC 4.04 (*)    Hemoglobin 12.4 (*)    HCT 35.9 (*)    All other components within normal limits  COMPREHENSIVE METABOLIC PANEL - Abnormal; Notable for the following:    Sodium 133 (*)    Potassium 3.4 (*)    CO2 21 (*)    Glucose, Bld 198 (*)    Calcium 7.9 (*)    All other components within normal limits  BASIC METABOLIC PANEL - Abnormal; Notable for the following:    Potassium 3.4 (*)    Glucose, Bld 133 (*)    Calcium 7.8 (*)    All other components within normal limits  CBC - Abnormal; Notable for the following:    RBC 3.74 (*)    Hemoglobin 11.4 (*)    HCT 33.5 (*)    All other components within normal limits  LIPID PANEL - Abnormal; Notable for the following:    HDL 22 (*)    All other components within normal limits  CBG MONITORING, ED - Abnormal; Notable for the following:    Glucose-Capillary 185 (*)    All other components within normal limits  I-STAT CG4 LACTIC ACID, ED - Abnormal; Notable for the following:    Lactic Acid, Venous 2.46 (*)    All other components within normal limits  CULTURE, BLOOD (ROUTINE X 2)  CULTURE, BLOOD (ROUTINE X 2)  URINE CULTURE  CULTURE, EXPECTORATED SPUTUM-ASSESSMENT  GRAM STAIN  LIPASE, BLOOD  LACTIC ACID, PLASMA  PROCALCITONIN  PROTIME-INR  TSH  APTT  LACTIC ACID, PLASMA  STREP PNEUMONIAE URINARY ANTIGEN  HIV ANTIBODY (ROUTINE TESTING)  HEMOGLOBIN A1C  I-STAT TROPONIN, ED  I-STAT CG4 LACTIC ACID, ED    ____________________________________________  EKG   EKG Interpretation  Date/Time:  Tuesday August 24 2016 14:23:30 EDT Ventricular Rate:  107 PR Interval:    QRS Duration: 97 QT Interval:  327 QTC Calculation: 437 R Axis:   -57 Text Interpretation:  Sinus tachycardia Probable left atrial enlargement Left anterior fascicular block Abnormal R-wave progression, late transition No STEMI.  Confirmed by Nanda Quinton [phone]) on 08/24/2016 2:32:34 PM       ____________________________________________  RADIOLOGY  Dg Chest 2 View  Result Date: 08/24/2016 CLINICAL DATA:  Dizziness EXAM: CHEST  2 VIEW COMPARISON:  03/28/2008 FINDINGS: Heart and mediastinal contours are within normal limits. No focal opacities or effusions. No acute bony abnormality. IMPRESSION: No active cardiopulmonary disease. Electronically Signed   By: Rolm Baptise M.D.   On: 08/24/2016 15:46  ____________________________________________   PROCEDURES  Procedure(s) performed:   Procedures  CRITICAL CARE Performed by: Margette Fast Total critical care time: 30 minutes Critical care time was exclusive of separately billable procedures and treating other patients. Critical care was necessary to treat or prevent imminent or life-threatening deterioration. Critical care was time spent personally by me on the following activities: development of treatment plan with patient and/or surrogate as well as nursing, discussions with consultants, evaluation of patient's response to treatment, examination of patient, obtaining history from patient or surrogate, ordering and performing treatments and interventions, ordering and review of laboratory studies, ordering and review of radiographic studies, pulse oximetry and re-evaluation of patient's condition.  Nanda Quinton, MD Emergency Medicine  ____________________________________________   INITIAL IMPRESSION / ASSESSMENT AND PLAN / ED COURSE  Pertinent labs & imaging  results that were available during my care of the patient were reviewed by me and considered in my medical decision making (see chart for details).  Patient resents to the emergency room in for evaluation of syncope and vomiting. He has experienced fatigue and cough over the past 2 days. On arrival to the emergency department his blood pressure has normalized after being found hypotensive in the field. He does have a fever and sinus tachycardia here. EKG with no acute ischemia. Plan for IV fluids, infectious w/u, and CXR with fever and cough.   04:01 PM Discussed lab results with the patient in detail. No pneumonia. Tachycardia seems to be improving with IV fluids. With no clear source at this time will follow UA and reassess. Lactate is elevated. Has had medication in the Penicillin family with no problems. He is very resistant to even observational admission but will stay for additional labs to result.   Patient now with rigors and slightly down-trending BP. Treating for sepsis with unknown infection source. Lactate returning to normal with IVF. With return or rigor plan for observation admission.   Discussed patient's case with Hospitalist, Dr. Lorin Mercy. Patient and family (if present) updated with plan. Care transferred to Hospitalist service.  I reviewed all nursing notes, vitals, pertinent old records, EKGs, labs, imaging (as available).  ____________________________________________  FINAL CLINICAL IMPRESSION(S) / ED DIAGNOSES  Final diagnoses:  Syncope and collapse  Fever, unspecified fever cause  Sepsis, due to unspecified organism [facility])     MEDICATIONS GIVEN DURING THIS VISIT:  Medications  ondansetron (ZOFRAN) injection 4 mg ( Intravenous Duplicate 1/61/09 6045)  loratadine (CLARITIN) tablet 10 mg (10 mg Oral Given 08/24/16 2150)  dextromethorphan-guaiFENesin (ROBITUSSIN-DM) 10-100 MG/5ML liquid 10 mL (not administered)  enalapril (VASOTEC) tablet 10 mg (not administered)    promethazine (PHENERGAN) tablet 12.5 mg (12.5 mg Oral Given 08/24/16 2150)  enoxaparin (LOVENOX) injection 40 mg (40 mg Subcutaneous Given 08/24/16 2152)  sodium chloride flush (NS) 0.9 % injection 3 mL (3 mLs Intravenous Not Given 08/24/16 2200)  lactated ringers infusion ( Intravenous Not Given 08/24/16 2030)  vancomycin (VANCOCIN) IVPB 1000 mg/200 mL premix (0 mg Intravenous Stopped 08/25/16 0534)  piperacillin-tazobactam (ZOSYN) IVPB 3.375 g (0 g Intravenous Stopped 08/25/16 0300)  0.9 %  sodium chloride infusion ( Intravenous Transfusing/Transfer 08/25/16 0755)  HYDROmorphone (DILAUDID) tablet 4 mg (4 mg Oral Given 08/24/16 2150)  zolpidem (AMBIEN) tablet 5 mg (5 mg Oral Given 08/24/16 2150)  guaiFENesin-dextromethorphan (ROBITUSSIN DM) 100-10 MG/5ML syrup 5 mL (not administered)  acetaminophen (TYLENOL) tablet 650 mg (650 mg Oral Given 08/24/16 1440)  piperacillin-tazobactam (ZOSYN) IVPB 3.375 g (0 g Intravenous Stopped 08/25/16 0755)  vancomycin (VANCOCIN) IVPB  1000 mg/200 mL premix (0 mg Intravenous Stopped 08/25/16 0755)  sodium chloride 0.9 % bolus 1,000 mL (0 mLs Intravenous Stopped 08/24/16 1848)  diphenhydrAMINE (BENADRYL) capsule 50 mg (50 mg Oral Given 08/24/16 1804)     NEW OUTPATIENT MEDICATIONS STARTED DURING THIS VISIT:  None   Note:  This document was prepared using Dragon voice recognition software and may include unintentional dictation errors.  Nanda Quinton, MD Emergency Medicine   Long, W[facility], MD 08/25/16 [phone]

## 2016-08-24 NOTE — ED Notes (Addendum)
Pt taken to xray. Pt received liter bolus that was cont in ED from EMS.

## 2016-08-24 NOTE — H&P (Signed)
History and Physical    Duane [name] TKZ:[id number] DOB: 06-Feb-1960 DOA: 08/24/2016  PCP: [name], MD Consultants:  Case - ortho; Rory - neurosurgery; pain management Patient coming from: Home - lives alone; NOK: Laurey Morale, friend, [phone]  Chief Complaint: syncope  HPI: Duane [name] is a 56 y.o. male with medical history significant of HTN, gout, and chronic pain presenting with syncope.  He reports that he has been feeling weak and tired for several days.  Coughing a lot, thought it was allergies.  Went to get cough medicine today and got hair cut.  While there, he got dizzy and laid down in the floor to prop his legs and get the blood moving.  EMS arrived and he was pale.  They helped him to stand up and he passed out and vomited.  Medics said he was unconscious for 2 minutes.  He had a simliar episode last month - nauseated and light-headed, sat down, and when he awoke he had vomited on himself and people were touching him.  No further episodes in between.  He awoke with chills several nights ago with rigors.  Took Tylenol and seemed to feel better.  Cough is nonproductive, but with some post-tussive emesis.  Saw some blood in the vomit today.  +trouble starting/stopping urinary stream which he attributes to his pain medication (not new); otherwise no urinary symptoms.  Lower back and abdominal muscle tenderness from shaking so much.  No sick contacts.   ED Course: Found hypotensive in the field but BP normalized by the time of arrival at the ER.  +Fever and tachycardia.  Negative EKG.  CXR negative.  Tachycardia improved with IVF.  Elevated lactate.  Given Vanc/Zosyn.  Review of Systems: As per HPI; otherwise review of systems reviewed and negative.   Ambulatory Status:  Ambulates without assistance  Past Medical History:  Diagnosis Date  . Arthritis   . Gout   . Hypertension     Past Surgical History:  Procedure Laterality Date  . ELBOW SURGERY    . FOOT  FASCIOTOMY     X 5  . HEMORRHOID SURGERY     X2, Dr. Arnoldo Morale  . NECK EXPLORATION    . SHOULDER ARTHROTOMY     multiple     Social History   Social History  . Marital status: Single    Spouse name: N/A  . Number of children: N/A  . Years of education: N/A   Occupational History  . police officer [employer]   Social History Main Topics  . Smoking status: Never Smoker  . Smokeless tobacco: Never Used  . Alcohol use No  . Drug use: No  . Sexual activity: No   Other Topics Concern  . Not on file   Social History Narrative  . No narrative on file    Allergies  Allergen Reactions  . Codeine   . Escitalopram Other (See Comments)    Patient states it caused night terrors  . Hydrocodone Nausea And Vomiting  . Oxycodone Nausea And Vomiting  . Penicillins Nausea Only    .Has patient had a PCN reaction causing immediate rash, facial/tongue/throat swelling, SOB or lightheadedness with hypotension: No Has patient had a PCN reaction causing severe rash involving mucus membranes or skin necrosis: No  Has patient had a PCN reaction that required hospitalization: No Has patient had a PCN reaction occurring within the last 10 years: No If all of the above answers are "NO", then  may proceed with Cephalosporin use.   . Levofloxacin Rash    Family History  Problem Relation Age of Onset  . Diabetes Mother   . COPD Father   . Colon cancer Neg Hx     Prior to Admission medications   Medication Sig Start Date End Date Taking? Authorizing Provider  acetaminophen (TYLENOL) 650 MG CR tablet Take 1,300 mg by mouth every 8 (eight) hours as needed for pain.   Yes [provider]  cetirizine (ZYRTEC) 10 MG tablet Take 10 mg by mouth daily.   Yes [provider]  dextromethorphan-guaiFENesin (ROBITUSSIN-DM) 10-100 MG/5ML liquid Take 10 mLs by mouth every 4 (four) hours as needed for cough.   Yes [provider]  enalapril (VASOTEC) 10 MG tablet 10  mg tablet; oral every day 07/02/16  Yes Sagardia, [name], MD  HYDROmorphone (DILAUDID) 4 MG tablet Take 1 tablet (4 mg total) by mouth every 4 (four) hours as needed for severe pain. Do not fill until 11/24/15 04/07/16  Yes [name], MD  indomethacin (INDOCIN) 50 MG capsule Take 50 mg by mouth 3 (three) times daily as needed.   Yes [provider]  promethazine (PHENERGAN) 25 MG tablet Take 0.5 tablets (12.5 mg total) by mouth every 6 (six) hours as needed for nausea. Patient taking differently: Take 25 mg by mouth every 6 (six) hours as needed for nausea.  07/02/16 08/24/16 Yes Sagardia, [name], MD  tadalafil (CIALIS) 20 MG tablet Take 0.5-1 tablets (10-20 mg total) by mouth every other day as needed for erectile dysfunction. 07/02/16  Yes [name], MD    Physical Exam: Vitals:   08/24/16 1912 08/24/16 1950 08/24/16 1954 08/24/16 2005  BP: (!) 161/119 (!) 177/107  (!) 147/92  Pulse: (!) 112 (!) 113  (!) 112  Resp: 20 20  (!) 28  Temp:   (!) 103.2 F (39.6 C)   TempSrc:   Oral   SpO2: 98% 96%  95%  Weight:      Height:         General: Appears somewhat ill and pale but is in NAD Eyes:  PERRL, EOMI, normal lids, iris ENT:  grossly normal hearing, lips & tongue, mmm; appropriate dentition Neck:  no LAD, masses or thyromegaly; no carotid bruits Cardiovascular:  Tachycardia, no m/r/g. No LE edema.  Respiratory:  +LLL rhonchi.  Normal respiratory effort. 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  Limited foot exam with no ulcerations.  2+ distal pulses. Psychiatric:  grossly normal mood and affect, speech fluent and appropriate, AOx3 Neurologic:  CN 2-12 grossly intact, moves all extremities in coordinated fashion, sensation  intact    Radiological Exams on Admission: Dg Chest 2 View  Result Date: 08/24/2016 CLINICAL DATA:  Dizziness EXAM: CHEST  2 VIEW COMPARISON:  03/28/2008 FINDINGS: Heart and mediastinal contours are within normal limits. No focal opacities or effusions. No acute bony abnormality. IMPRESSION: No active cardiopulmonary disease. Electronically Signed   By: Rolm Baptise M.D.   On: 08/24/2016 15:46    EKG: Independently reviewed.  Sinus tachycardia with rate 107; LAFB with no evidence of acute ischemia   Labs on Admission: I have personally reviewed the available labs and imaging studies at the time of  the admission.  Pertinent labs:  Lactate 2.46, repeat 0.80 Glucose 198 BUN 13/Creatinine 1.17/GFR >60, unchanged from 2010 Troponin 0 WBC 8.8 UA negative   Assessment/Plan Principal Problem:   Syncope Active Problems:   Chronic pain   Essential hypertension   Sepsis due to pneumonia (HCC)   Hyperglycemia   Syncope -Etiology is most likely vasovagal syncope resulting from acute infection/sepsis and associated mild hypovolemia -By the [facility] syncope rule, the patient is at low risk for serious outcome  -The only concerning thing is that this is the patient's second recent episode, both similar. -Will monitor on telemetry -Orthostatic vital signs in AM -Neuro checks  -check A1c, FLP, TSH -Consider outpatient cardiology evaluation and/or echo given recurrent episodes of syncope - but with acute sepsis physiology with this episode, additional syncope evaluation as an inpatient is likely unproductive  Sepsis from PNA -Fever, tachycardia, tachypnea with elevated lactate and borderline hypotension -While awaiting blood cultures, this appears to be a preseptic condition. -Sepsis protocol initiated -Given significant cough, fever to 103, tachypnea, and LLL rhonchi , most likely community-acquired pneumonia.  -Because the CXR was negative, options include chest CT or treating  empirically; after discussion, we are in agreement with treating empirically. -For now, will continue Vanc/Zosyn for undifferentiated sepsis, but likely able to transition to narrower spectrum abx if blood cultures are negative. -CURB-65 score is 1. -Pneumonia Severity Index (PSI) is Class 3, score 89, 0.9% mortality. -NS @ 75cc/hr -Fever control -Repeat CBC in am -Sputum cultures -Blood cultures -Strep pneumo testing -Will order procalcitonin level.  >0.5 indicates infection and >>0.5 indicates more serious disease.  As the procalcitonin level normalizes, it will be reasonable to consider de-escalation of antibiotic coverage. -Robitussin DM for cough -Will add HIV -Will trend lactate to ensure improvement  HTN -Continue Enalapril  Hyperglycemia -No known h/o DM -Glucose is high for simple stress-induced hyperglycemia -Will check A1c  Chronic pain -I have reviewed this patient in the [place] Controlled Substances Reporting System.  He is receiving his medications from multiple providers but appears to be taking them as prescribed. -Will attempt to continue home medications without additional IV medications, if possible.  DVT prophylaxis: Lovenox  Code Status:  Full - confirmed with patient Family Communication: None present  Disposition Plan:  Home once clinically improved Consults called: None  Admission status: It is my clinical opinion that referral for OBSERVATION is reasonable and necessary in this patient based on the above information provided. The aforementioned taken together are felt to place the patient at high risk for further clinical deterioration. However it is anticipated that the patient may be medically stable for discharge from the hospital within 24 to 48 hours.    Karmen Bongo MD [facility]  If note is complete, please contact covering daytime or nighttime physician. [email or web address] Password [facility]  08/24/2016, 8:34 PM

## 2016-08-24 NOTE — ED Notes (Signed)
edp talked with pt and pt agreeing to be admitted at this time

## 2016-08-24 NOTE — ED Notes (Signed)
Red splotchy rash to left forearm above IV site in left hand.  Face appears flushed.  Notified EDP and he will assess pt.

## 2016-08-24 NOTE — Progress Notes (Signed)
Pharmacy Antibiotic Note  Duane [name] is a 56 y.o. male admitted on 08/24/2016 with sepsis.  Pharmacy has been consulted for Vancomycin and Zosyn dosing.  Initial doses given in ED.  PCN intolerance = Nausea.    Plan: Vancomycin 1gm IV every 12 hours.  Goal trough 15-20 mcg/mL. Zosyn 3.375g IV q8h (4 hour infusion).  Monitor labs, micro and vitals.   Height: 5\' 9"  (175.3 cm) Weight: 155 lb (70.3 kg) IBW/kg (Calculated) : 70.7  Temp (24hrs), Avg:101.2 F (38.4 C), Min:99.3 F (37.4 C), Max:103.2 F (39.6 C)   Recent Labs Lab 08/24/16 1500 08/24/16 1503 08/24/16 1513 08/24/16 1740  WBC 8.8  --   --   --   CREATININE  --  1.17  --   --   LATICACIDVEN  --   --  2.46* 0.80    Estimated Creatinine Clearance: 71.8 mL/min (by C-G formula based on SCr of 1.17 mg/dL).    Allergies  Allergen Reactions  . Codeine   . Escitalopram Other (See Comments)    Patient states it caused night terrors  . Hydrocodone Nausea And Vomiting  . Oxycodone Nausea And Vomiting  . Penicillins Nausea Only    .Has patient had a PCN reaction causing immediate rash, facial/tongue/throat swelling, SOB or lightheadedness with hypotension: No Has patient had a PCN reaction causing severe rash involving mucus membranes or skin necrosis: No  Has patient had a PCN reaction that required hospitalization: No Has patient had a PCN reaction occurring within the last 10 years: No If all of the above answers are "NO", then may proceed with Cephalosporin use.   . Levofloxacin Rash    Antimicrobials this admission: Vanc 8/14 >>  Zosyn 8/14 >>   Dose adjustments this admission: n/a   Microbiology results: 8/14 BCx: pending 8/14 UCx: pending   Sputum:    MRSA PCR:   Thank you for allowing pharmacy to be a part of this patient's care.  Duane [name] 08/24/2016 8:03 PM

## 2016-08-24 NOTE — ED Notes (Signed)
Need 2nd blood culture prior to starting abx

## 2016-08-24 NOTE — ED Triage Notes (Signed)
Per ems, pt was sitting in chair getting his hair cut and suddenly felt dizzy, nauseated, and had to lay in the floor.  EMS arrived, pt's bp was 43'X systolic and 54'M with standing.  HR 108.  EMS reports while sitting in floor, pt vomited and then passed out for approx 2-3 min.  Also reports pt has been coughing a lot since vomiting.  Pt alert and oriented, c/o chronic lower back pain.  Pt says he takes dilaudid and phenergan for back pain.  Pt has fever upon arrival to hospital and reports he had a similar episode approx 1 month ago.  EMS says they think pt may have had a seizure.  Denies history of seizures.

## 2016-08-25 DIAGNOSIS — R7881 Bacteremia: Secondary | ICD-10-CM

## 2016-08-25 DIAGNOSIS — E876 Hypokalemia: Secondary | ICD-10-CM | POA: Diagnosis present

## 2016-08-25 DIAGNOSIS — Z79899 Other long term (current) drug therapy: Secondary | ICD-10-CM | POA: Diagnosis not present

## 2016-08-25 DIAGNOSIS — E861 Hypovolemia: Secondary | ICD-10-CM | POA: Diagnosis present

## 2016-08-25 DIAGNOSIS — A419 Sepsis, unspecified organism: Secondary | ICD-10-CM | POA: Diagnosis present

## 2016-08-25 DIAGNOSIS — Z888 Allergy status to other drugs, medicaments and biological substances status: Secondary | ICD-10-CM | POA: Diagnosis not present

## 2016-08-25 DIAGNOSIS — Z881 Allergy status to other antibiotic agents status: Secondary | ICD-10-CM | POA: Diagnosis not present

## 2016-08-25 DIAGNOSIS — R509 Fever, unspecified: Secondary | ICD-10-CM | POA: Diagnosis not present

## 2016-08-25 DIAGNOSIS — I10 Essential (primary) hypertension: Secondary | ICD-10-CM | POA: Diagnosis present

## 2016-08-25 DIAGNOSIS — R7302 Impaired glucose tolerance (oral): Secondary | ICD-10-CM | POA: Diagnosis present

## 2016-08-25 DIAGNOSIS — G894 Chronic pain syndrome: Secondary | ICD-10-CM | POA: Diagnosis present

## 2016-08-25 DIAGNOSIS — A4150 Gram-negative sepsis, unspecified: Secondary | ICD-10-CM | POA: Diagnosis not present

## 2016-08-25 DIAGNOSIS — R55 Syncope and collapse: Secondary | ICD-10-CM | POA: Diagnosis present

## 2016-08-25 DIAGNOSIS — Z880 Allergy status to penicillin: Secondary | ICD-10-CM | POA: Diagnosis not present

## 2016-08-25 DIAGNOSIS — I339 Acute and subacute endocarditis, unspecified: Secondary | ICD-10-CM | POA: Diagnosis not present

## 2016-08-25 DIAGNOSIS — R739 Hyperglycemia, unspecified: Secondary | ICD-10-CM

## 2016-08-25 DIAGNOSIS — Z833 Family history of diabetes mellitus: Secondary | ICD-10-CM | POA: Diagnosis not present

## 2016-08-25 DIAGNOSIS — J181 Lobar pneumonia, unspecified organism: Secondary | ICD-10-CM | POA: Diagnosis present

## 2016-08-25 DIAGNOSIS — Z885 Allergy status to narcotic agent status: Secondary | ICD-10-CM | POA: Diagnosis not present

## 2016-08-25 LAB — CBC
HEMATOCRIT: 33.5 % — AB (ref 39.0–52.0)
HEMOGLOBIN: 11.4 g/dL — AB (ref 13.0–17.0)
MCH: 30.5 pg (ref 26.0–34.0)
MCHC: 34 g/dL (ref 30.0–36.0)
MCV: 89.6 fL (ref 78.0–100.0)
Platelets: 201 10*3/uL (ref 150–400)
RBC: 3.74 MIL/uL — ABNORMAL LOW (ref 4.22–5.81)
RDW: 13.1 % (ref 11.5–15.5)
WBC: 10.3 10*3/uL (ref 4.0–10.5)

## 2016-08-25 LAB — BASIC METABOLIC PANEL
Anion gap: 6 (ref 5–15)
BUN: 12 mg/dL (ref 6–20)
CO2: 25 mmol/L (ref 22–32)
CREATININE: 0.99 mg/dL (ref 0.61–1.24)
Calcium: 7.8 mg/dL — ABNORMAL LOW (ref 8.9–10.3)
Chloride: 104 mmol/L (ref 101–111)
GFR calc Af Amer: >60 mL/min (ref >60)
GLUCOSE: 133 mg/dL — AB (ref 65–99)
POTASSIUM: 3.4 mmol/L — AB (ref 3.5–5.1)
SODIUM: 135 mmol/L (ref 135–145)

## 2016-08-25 LAB — LIPID PANEL
CHOL/HDL RATIO: 4.3 ratio
Cholesterol: 94 mg/dL (ref 0–200)
HDL: 22 mg/dL — ABNORMAL LOW (ref >40)
LDL Cholesterol: 54 mg/dL (ref 0–99)
Triglycerides: 91 mg/dL (ref <150)
VLDL: 18 mg/dL (ref 0–40)

## 2016-08-25 LAB — LACTIC ACID, PLASMA: LACTIC ACID, VENOUS: 1.5 mmol/L (ref 0.5–1.9)

## 2016-08-25 LAB — STREP PNEUMONIAE URINARY ANTIGEN: STREP PNEUMO URINARY ANTIGEN: NEGATIVE

## 2016-08-25 MED ORDER — POTASSIUM CHLORIDE IN NACL 20-0.9 MEQ/L-% IV SOLN
INTRAVENOUS | Status: AC
  Administered 2016-08-25 – 2016-08-26 (×2): via INTRAVENOUS

## 2016-08-25 MED ORDER — POTASSIUM CHLORIDE CRYS ER 20 MEQ PO TBCR
20.0000 meq | EXTENDED_RELEASE_TABLET | Freq: Once | ORAL | Status: AC
  Administered 2016-08-25: 20 meq via ORAL
  Filled 2016-08-25: qty 1

## 2016-08-25 MED ORDER — GUAIFENESIN-DM 100-10 MG/5ML PO SYRP: 5.0000 mL | ORAL_SOLUTION | ORAL | Status: DC | PRN

## 2016-08-25 NOTE — Progress Notes (Signed)
PHARMACY - PHYSICIAN COMMUNICATION CRITICAL VALUE ALERT - BLOOD CULTURE IDENTIFICATION (BCID)  No results found for this or any previous visit.  Name of physician (or Provider) Contacted: Dr. Carles Collet  A/P: Microbiology lab called to resend initial GNR in Blood Cx bottle. It is no growth to date.  Blood cx upated in [facility], BS [place], [place] Clinical Pharmacist Pager [phone] 08/25/2016  2:41 PM

## 2016-08-25 NOTE — Progress Notes (Addendum)
PROGRESS NOTE  Duane [name]:[id number] DOB: Oct 26, 1960 DOA: 08/24/2016 PCP: [name], MD  Brief History:  56 year old male with a history of hypertension, chronic pain syndrome, cardiac arthritis presented after a syncopal episode at the barbershop. The patient states that he has had lethargy, malaise, and decreased oral intake for the past 2-3 weeks with worsening over the past 3 days. In addition, the patient has had subjective fevers and chills over the same period of time. On 08/24/2016, the patient had an episode of dizziness and laid on the floor. EMS was activated. As the patient was getting up to stand, he had an episode of emesis and lost consciousness. Apparently he was unconscious for approximately 2 minutes. He had a similar episode one month ago. He denied any aura, bladder or bowel incontinence or tongue bite. Initial workup showed lactic acid 2.46 with fever 103.58F and tachycardia. The patient was started on vancomycin and Zosyn.  Assessment/Plan: Sepsis -Secondary to bacteremia -Lactic acid peaked 2.46 -Procalcitonin--0.11 -Continue Zosyn pending culture data -Discontinued vancomycin -Urinalysis negative for polyuria -Personally reviewed chest x-ray--negative for infiltrates or edema  Bacteremia -Preliminary result shows GNR -Continue Zosyn pending culture data -Source unclear  Syncope -secondary to vasovagal mechanism and volume depletion in setting of sepsis -Echo -reviewed telemetry--no concerning dysrhythmia  Hyperglycemia -Check hemoglobin A1c  Essential hypertension -Continue enalapril  Chronic pain syndrome -Continue home dose of Phenergan and Dilaudid  Hypokalemia -replete -check mag     Disposition Plan:   Home in 2-3 days  Family Communication:   No Family at bedside  Consultants:  none  Code Status:  FULL  DVT Prophylaxis:   [place] Lovenox   Procedures: As Listed in Progress Note  Above  Antibiotics: Vancomycin 8/14>>>8/15 Zosyn 8/14>>>    Subjective: Patient denies fevers, chills, headache, chest pain, dyspnea, nausea, vomiting, diarrhea, abdominal pain, dysuria, hematuria, hematochezia, and melena.   Objective: Vitals:   08/24/16 2005 08/24/16 2046 08/25/16 0446 08/25/16 0846  BP: (!) 147/92  122/71 (!) 145/87  Pulse: (!) 112  86 89  Resp: (!) 28  20 18   Temp:  (!) 103.3 F (39.6 C) 99.3 F (37.4 C) 99.6 F (37.6 C)  TempSrc:  Oral Oral Oral  SpO2: 95% 97% 99% 97%  Weight:  70.5 kg (155 lb 8 oz)    Height:  5\' 9"  (1.753 m)      Intake/Output Summary (Last 24 hours) at 08/25/16 1030 Last data filed at 08/25/16 0842  Gross per 24 hour  Intake          2161.25 ml  Output                0 ml  Net          2161.25 ml   Weight change:  Exam:   General:  Pt is alert, follows commands appropriately, not in acute distress  HEENT: No icterus, No thrush, No neck mass, [place]/AT  Cardiovascular: RRR, S1/S2, no rubs, no gallops  Respiratory: CTA bilaterally, no wheezing, no crackles, no rhonchi  Abdomen: Soft/+BS, non tender, non distended, no guarding  Extremities: No edema, No lymphangitis, No petechiae, No rashes, no synovitis   Data Reviewed: I have personally reviewed following labs and imaging studies Basic Metabolic Panel:  Recent Labs Lab 08/24/16 1503 08/25/16 0539  NA 133* 135  K 3.4* 3.4*  CL 103 104  CO2 21* 25  GLUCOSE 198* 133*  BUN 13 12  CREATININE 1.17 0.99  CALCIUM 7.9* 7.8*   Liver Function Tests:  Recent Labs Lab 08/24/16 1503  AST 25  ALT 18  ALKPHOS 54  BILITOT 1.1  PROT 6.9  ALBUMIN 3.5    Recent Labs Lab 08/24/16 1503  LIPASE 24   No results for input(s): AMMONIA in the last 168 hours. Coagulation Profile:  Recent Labs Lab 08/24/16 1503  INR 1.10   CBC:  Recent Labs Lab 08/24/16 1500 08/25/16 0539  WBC 8.8 10.3  NEUTROABS 7.4  --   HGB 12.4* 11.4*  HCT 35.9* 33.5*  MCV 88.9 89.6   PLT 213 201   Cardiac Enzymes: No results for input(s): CKTOTAL, CKMB, CKMBINDEX, TROPONINI in the last 168 hours. BNP: Invalid input(s): POCBNP CBG:  Recent Labs Lab 08/24/16 1423  GLUCAP 185*   HbA1C: No results for input(s): HGBA1C in the last 72 hours. Urine analysis:    Component Value Date/Time   COLORURINE AMBER (A) 08/24/2016 1436   APPEARANCEUR CLEAR 08/24/2016 1436   LABSPEC 1.018 08/24/2016 1436   PHURINE 5.0 08/24/2016 1436   GLUCOSEU NEGATIVE 08/24/2016 1436   HGBUR NEGATIVE 08/24/2016 1436   BILIRUBINUR NEGATIVE 08/24/2016 1436   KETONESUR NEGATIVE 08/24/2016 1436   PROTEINUR NEGATIVE 08/24/2016 1436   UROBILINOGEN 0.2 03/28/2008 1002   NITRITE NEGATIVE 08/24/2016 1436   LEUKOCYTESUR NEGATIVE 08/24/2016 1436   Sepsis Labs: @LABRCNTIP (procalcitonin:4,lacticidven:4) ) Recent Results (from the past 240 hour(s))  Culture, blood (routine x 2)     Status: None (Preliminary result)   Collection Time: 08/24/16  3:03 PM  Result Value Ref Range Status   Specimen Description BLOOD RIGHT HAND  Final   Special Requests   Final    BOTTLES DRAWN AEROBIC AND ANAEROBIC Blood Culture results may not be optimal due to an inadequate volume of blood received in culture bottles   Culture  Setup Time   Final    GRAM NEGATIVE RODS Gram Stain Report Called to,Read Back By and Verified With: MAYS,J AT 0720 BY HUFFINES,S ON 08/25/16.    Culture NO GROWTH < 24 HOURS  Final   Report Status PENDING  Incomplete  Culture, blood (routine x 2)     Status: None (Preliminary result)   Collection Time: 08/24/16  3:03 PM  Result Value Ref Range Status   Specimen Description BLOOD RIGHT WRIST  Final   Special Requests   Final    BOTTLES DRAWN AEROBIC AND ANAEROBIC Blood Culture adequate volume   Culture NO GROWTH < 24 HOURS  Final   Report Status PENDING  Incomplete     Scheduled Meds: . enalapril  10 mg Oral Daily  . enoxaparin (LOVENOX) injection  40 mg Subcutaneous Q24H  .  loratadine  10 mg Oral Daily  . sodium chloride flush  3 mL Intravenous Q12H   Continuous Infusions: . sodium chloride 75 mL/hr at 08/24/16 2151  . lactated ringers    . piperacillin-tazobactam (ZOSYN)  IV 3.375 g (08/25/16 0842)    Procedures/Studies: Dg Chest 2 View  Result Date: 08/24/2016 CLINICAL DATA:  Dizziness EXAM: CHEST  2 VIEW COMPARISON:  03/28/2008 FINDINGS: Heart and mediastinal contours are within normal limits. No focal opacities or effusions. No acute bony abnormality. IMPRESSION: No active cardiopulmonary disease. Electronically Signed   By: Rolm Baptise M.D.   On: 08/24/2016 15:46    Domonique Brouillard, DO  [facility] Pager [phone]  If 7PM-7AM, please contact night-coverage [email or web address] Password [email or web address] 08/25/2016, 10:30 AM   LOS: 0 days

## 2016-08-25 NOTE — Progress Notes (Signed)
EKG completed, reviewed by Dr. Loralee Pacas and placed on pt's chart

## 2016-08-25 NOTE — Progress Notes (Addendum)
CRITICAL VALUE ALERT  Critical Value:  Blood Culture Positive For Gram - Rods  Date & Time Notied:  08/25/16 0710  Provider Notified: Dr. Carles Collet  Orders Received/Actions taken:

## 2016-08-26 ENCOUNTER — Encounter (HOSPITAL_COMMUNITY): Payer: Self-pay | Admitting: Radiology

## 2016-08-26 ENCOUNTER — Inpatient Hospital Stay (HOSPITAL_COMMUNITY): Payer: Medicare Other

## 2016-08-26 DIAGNOSIS — A419 Sepsis, unspecified organism: Principal | ICD-10-CM

## 2016-08-26 DIAGNOSIS — R509 Fever, unspecified: Secondary | ICD-10-CM

## 2016-08-26 LAB — BASIC METABOLIC PANEL
Anion gap: 7 (ref 5–15)
BUN: 10 mg/dL (ref 6–20)
CALCIUM: 8.2 mg/dL — AB (ref 8.9–10.3)
CO2: 26 mmol/L (ref 22–32)
CREATININE: 0.98 mg/dL (ref 0.61–1.24)
Chloride: 102 mmol/L (ref 101–111)
GFR calc Af Amer: >60 mL/min (ref >60)
Glucose, Bld: 84 mg/dL (ref 65–99)
Potassium: 3.6 mmol/L (ref 3.5–5.1)
SODIUM: 135 mmol/L (ref 135–145)

## 2016-08-26 LAB — URINE CULTURE
Culture: NO GROWTH
SPECIAL REQUESTS: NORMAL

## 2016-08-26 LAB — HEMOGLOBIN A1C
HEMOGLOBIN A1C: 6.3 % — AB (ref 4.8–5.6)
Mean Plasma Glucose: 134 mg/dL

## 2016-08-26 LAB — MAGNESIUM: MAGNESIUM: 1.9 mg/dL (ref 1.7–2.4)

## 2016-08-26 LAB — HIV ANTIBODY (ROUTINE TESTING W REFLEX): HIV SCREEN 4TH GENERATION: NONREACTIVE

## 2016-08-26 MED ORDER — SODIUM CHLORIDE 0.9 % IV SOLN
INTRAVENOUS | Status: AC
  Administered 2016-08-26: 18:00:00 via INTRAVENOUS

## 2016-08-26 MED ORDER — IOPAMIDOL (ISOVUE-300) INJECTION 61%
INTRAVENOUS | Status: AC
  Administered 2016-08-26: 100 mL
  Filled 2016-08-26: qty 30

## 2016-08-26 MED ORDER — IOPAMIDOL (ISOVUE-300) INJECTION 61%: 100.0000 mL | Freq: Once | INTRAVENOUS | Status: DC | PRN

## 2016-08-26 NOTE — Progress Notes (Addendum)
PROGRESS NOTE  Duane [name] JGG:[id number] DOB: [birth year] DOA: 08/24/2016 PCP: [name], MD Brief History:  56 year old male with a history of hypertension, chronic pain syndrome, cardiac arthritis presented after a syncopal episode at the barbershop. The patient states that he has had lethargy, malaise, and decreased oral intake for the past 2-3 weeks with worsening over the past 3 days. In addition, the patient has had subjective fevers and chills over the same period of time. On 08/24/2016, the patient had an episode of dizziness and laid on the floor. EMS was activated. As the patient was getting up to stand, he had an episode of emesis and lost consciousness. Apparently he was unconscious for approximately 2 minutes. He had a similar episode one month ago. He denied any aura, bladder or bowel incontinence or tongue bite. Initial workup showed lactic acid 2.46 with fever 103.58F and tachycardia. The patient was started on vancomycin and Zosyn.  Assessment/Plan: Sepsis/FUO -source unclear--revised blood culture results-->no growth -Lactic acid peaked 2.46 -Procalcitonin--0.11 -Continue Zosyn pending culture data -Discontinued vancomycin -Urinalysis negative for polyuria -Personally reviewed chest x-ray--negative for infiltrates or edema -viral respiratory panel -CT chest -CT abd/pelvis -am CBC  Syncope -secondary to vasovagal mechanism and volume depletion in setting of sepsis -Echo -reviewed telemetry--no concerning dysrhythmia  Hyperglycemia/Impaired glucose tolerance -Check hemoglobin A1c--6.3  Essential hypertension -Continue enalapril  Chronic pain syndrome -Continue home dose of Phenergan and Dilaudid  Hypokalemia -replete -check mag--1.9     Disposition Plan:   Home in 1-2 days  Family Communication:   No Family at bedside--Total time spent 35 minutes.  Greater than 50% spent face to face counseling and coordinating  care.   Consultants:  none  Code Status:  FULL  DVT Prophylaxis:   [place] Lovenox   Procedures: As Listed in Progress Note Above  Antibiotics: Vancomycin 8/14>>>8/15 Zosyn 8/14>>>     Subjective: Patient denies fevers, chills, headache, chest pain, dyspnea, nausea, vomiting, diarrhea, abdominal pain, dysuria, hematuria, hematochezia, and melena.   Objective: Vitals:   08/26/16 0500 08/26/16 0900 08/26/16 1457 08/26/16 1700  BP: 131/70 133/81 116/80 131/74  Pulse: 91 82 92 88  Resp: 18 19 20 20   Temp: (!) 100.7 F (38.2 C) 99.7 F (37.6 C) 98.5 F (36.9 C) 99.8 F (37.7 C)  TempSrc: Oral Oral Oral Oral  SpO2: 95% 97% 97% 99%  Weight:      Height:        Intake/Output Summary (Last 24 hours) at 08/26/16 1841 Last data filed at 08/26/16 1834  Gross per 24 hour  Intake              350 ml  Output                0 ml  Net              350 ml   Weight change:  Exam:   General:  Pt is alert, follows commands appropriately, not in acute distress  HEENT: No icterus, No thrush, No neck mass, [place]/AT  Cardiovascular: RRR, S1/S2, no rubs, no gallops  Respiratory: CTA bilaterally, no wheezing, no crackles, no rhonchi  Abdomen: Soft/+BS, non tender, non distended, no guarding  Extremities: No edema, No lymphangitis, No petechiae, No rashes, no synovitis   Data Reviewed: I have personally reviewed following labs and imaging studies Basic Metabolic Panel:  Recent Labs Lab 08/24/16 1503 08/25/16 0539 08/26/16 0420  NA 133* 135 135  K 3.4* 3.4* 3.6  CL 103 104 102  CO2 21* 25 26  GLUCOSE 198* 133* 84  BUN 13 12 10   CREATININE 1.17 0.99 0.98  CALCIUM 7.9* 7.8* 8.2*  MG  --   --  1.9   Liver Function Tests:  Recent Labs Lab 08/24/16 1503  AST 25  ALT 18  ALKPHOS 54  BILITOT 1.1  PROT 6.9  ALBUMIN 3.5    Recent Labs Lab 08/24/16 1503  LIPASE 24   No results for input(s): AMMONIA in the last 168 hours. Coagulation Profile:  Recent  Labs Lab 08/24/16 1503  INR 1.10   CBC:  Recent Labs Lab 08/24/16 1500 08/25/16 0539  WBC 8.8 10.3  NEUTROABS 7.4  --   HGB 12.4* 11.4*  HCT 35.9* 33.5*  MCV 88.9 89.6  PLT 213 201   Cardiac Enzymes: No results for input(s): CKTOTAL, CKMB, CKMBINDEX, TROPONINI in the last 168 hours. BNP: Invalid input(s): POCBNP CBG:  Recent Labs Lab 08/24/16 1423  GLUCAP 185*   HbA1C:  Recent Labs  08/24/16 1503  HGBA1C 6.3*   Urine analysis:    Component Value Date/Time   COLORURINE AMBER (A) 08/24/2016 1436   APPEARANCEUR CLEAR 08/24/2016 1436   LABSPEC 1.018 08/24/2016 1436   PHURINE 5.0 08/24/2016 1436   GLUCOSEU NEGATIVE 08/24/2016 1436   HGBUR NEGATIVE 08/24/2016 1436   BILIRUBINUR NEGATIVE 08/24/2016 1436   KETONESUR NEGATIVE 08/24/2016 1436   PROTEINUR NEGATIVE 08/24/2016 1436   UROBILINOGEN 0.2 03/28/2008 1002   NITRITE NEGATIVE 08/24/2016 1436   LEUKOCYTESUR NEGATIVE 08/24/2016 1436   Sepsis Labs: @LABRCNTIP (procalcitonin:4,lacticidven:4) ) Recent Results (from the past 240 hour(s))  Urine culture     Status: None   Collection Time: 08/24/16  2:53 PM  Result Value Ref Range Status   Specimen Description URINE, CLEAN CATCH  Final   Special Requests Normal  Final   Culture   Final    NO GROWTH Performed at [facility], [place] [address] [place], [place] [address]    Report Status 08/26/2016 FINAL  Final  Culture, blood (routine x 2)     Status: None (Preliminary result)   Collection Time: 08/24/16  3:03 PM  Result Value Ref Range Status   Specimen Description BLOOD RIGHT HAND  Final   Special Requests   Final    BOTTLES DRAWN AEROBIC AND ANAEROBIC Blood Culture results may not be optimal due to an inadequate volume of blood received in culture bottles   Culture  Setup Time   Final    NO ORGANISMS SEEN CORRECTED RESULTS PREVIOUSLY REPORTED AS: GRAM NEGATIVE RODS CORRECTED RESULTS CALLED TO: L POOLE 08/25/16 @ 45 M VESTAL Performed at [facility], [place] [address], [place], [place] [address]    Culture NO GROWTH 2 DAYS  Final   Report Status PENDING  Incomplete  Culture, blood (routine x 2)     Status: None (Preliminary result)   Collection Time: 08/24/16  3:03 PM  Result Value Ref Range Status   Specimen Description BLOOD RIGHT WRIST  Final   Special Requests   Final    BOTTLES DRAWN AEROBIC AND ANAEROBIC Blood Culture adequate volume   Culture NO GROWTH 2 DAYS  Final   Report Status PENDING  Incomplete     Scheduled Meds: . iopamidol      . enalapril  10 mg Oral Daily  . enoxaparin (LOVENOX) injection  40 mg Subcutaneous Q24H  . loratadine  10 mg Oral Daily  .  sodium chloride flush  3 mL Intravenous Q12H   Continuous Infusions: . sodium chloride 75 mL/hr at 08/26/16 1826  . piperacillin-tazobactam (ZOSYN)  IV 3.375 g (08/26/16 1450)    Procedures/Studies: Dg Chest 2 View  Result Date: 08/24/2016 CLINICAL DATA:  Dizziness EXAM: CHEST  2 VIEW COMPARISON:  03/28/2008 FINDINGS: Heart and mediastinal contours are within normal limits. No focal opacities or effusions. No acute bony abnormality. IMPRESSION: No active cardiopulmonary disease. Electronically Signed   By: Rolm Baptise M.D.   On: 08/24/2016 15:46    Raegen Tarpley, DO  [facility] Pager [phone]  If 7PM-7AM, please contact night-coverage [email or web address] Password [email or web address] 08/26/2016, 6:41 PM   LOS: 1 day

## 2016-08-27 ENCOUNTER — Inpatient Hospital Stay (HOSPITAL_COMMUNITY): Payer: Medicare Other

## 2016-08-27 DIAGNOSIS — J181 Lobar pneumonia, unspecified organism: Secondary | ICD-10-CM

## 2016-08-27 DIAGNOSIS — I339 Acute and subacute endocarditis, unspecified: Secondary | ICD-10-CM

## 2016-08-27 LAB — ECHOCARDIOGRAM COMPLETE
AO mean calculated velocity dopler: 143 cm/s
AOPV: 0.37 m/s
AOVTI: 36.2 cm
AV Area VTI index: 1 cm2/m2
AV Area VTI: 1.52 cm2
AV Area mean vel: 1.59 cm2
AV Peak grad: 17 mmHg
AV VEL mean LVOT/AV: 0.38
AV area mean vel ind: 0.85 cm2/m2
AVG: 9 mmHg
AVLVOTPG: 2 mmHg
AVPKVEL: 206 cm/s
CHL CUP AV PEAK INDEX: 0.81
CHL CUP AV VALUE AREA INDEX: 1
CHL CUP AV VEL: 1.88
CHL CUP RV SYS PRESS: 18 mmHg
E decel time: 292 msec
EERAT: 6.78
FS: 20 % — AB (ref 28–44)
HEIGHTINCHES: 69 in
IV/PV OW: 1.15
LA diam end sys: 28 mm
LA vol index: 22.4 mL/m2
LA vol: 41.9 mL
LADIAMINDEX: 1.5 cm/m2
LASIZE: 28 mm
LAVOLA4C: 41.2 mL
LV TDI E'MEDIAL: 7.62
LVEEAVG: 6.78
LVEEMED: 6.78
LVELAT: 8.49 cm/s
LVOT SV: 68 mL
LVOT VTI: 16.4 cm
LVOT area: 4.15 cm2
LVOT diameter: 23 mm
LVOT peak VTI: 0.45 cm
LVOT peak vel: 75.4 cm/s
Lateral S' vel: 14.9 cm/s
MV Dec: 292
MV pk E vel: 57.6 m/s
MVPKAVEL: 67.5 m/s
PW: 10.9 mm — AB (ref 0.6–1.1)
Reg peak vel: 194 cm/s
TAPSE: 18.4 mm
TDI e' lateral: 8.49
TR max vel: 194 cm/s
Valve area: 1.88 cm2
Weight: 2352.75 oz

## 2016-08-27 LAB — CBC WITH DIFFERENTIAL/PLATELET
BASOS PCT: 0 %
Basophils Absolute: 0 10*3/uL (ref 0.0–0.1)
EOS ABS: 0 10*3/uL (ref 0.0–0.7)
Eosinophils Relative: 0 %
HEMATOCRIT: 34.2 % — AB (ref 39.0–52.0)
HEMOGLOBIN: 11.8 g/dL — AB (ref 13.0–17.0)
LYMPHS ABS: 1.3 10*3/uL (ref 0.7–4.0)
Lymphocytes Relative: 18 %
MCH: 30.3 pg (ref 26.0–34.0)
MCHC: 34.5 g/dL (ref 30.0–36.0)
MCV: 87.7 fL (ref 78.0–100.0)
MONO ABS: 0.7 10*3/uL (ref 0.1–1.0)
MONOS PCT: 10 %
NEUTROS PCT: 72 %
Neutro Abs: 5.5 10*3/uL (ref 1.7–7.7)
Platelets: 260 10*3/uL (ref 150–400)
RBC: 3.9 MIL/uL — ABNORMAL LOW (ref 4.22–5.81)
RDW: 12.8 % (ref 11.5–15.5)
WBC: 7.6 10*3/uL (ref 4.0–10.5)

## 2016-08-27 LAB — RESPIRATORY PANEL BY PCR
Adenovirus: NOT DETECTED
Bordetella pertussis: NOT DETECTED
CHLAMYDOPHILA PNEUMONIAE-RVPPCR: NOT DETECTED
CORONAVIRUS HKU1-RVPPCR: NOT DETECTED
CORONAVIRUS NL63-RVPPCR: NOT DETECTED
CORONAVIRUS OC43-RVPPCR: NOT DETECTED
Coronavirus 229E: NOT DETECTED
INFLUENZA A H3-RVPPCR: NOT DETECTED
INFLUENZA A-RVPPCR: NOT DETECTED
INFLUENZA B-RVPPCR: NOT DETECTED
Influenza A H1 2009: NOT DETECTED
Influenza A H1: NOT DETECTED
Metapneumovirus: NOT DETECTED
Mycoplasma pneumoniae: DETECTED — AB
PARAINFLUENZA VIRUS 1-RVPPCR: NOT DETECTED
PARAINFLUENZA VIRUS 3-RVPPCR: NOT DETECTED
PARAINFLUENZA VIRUS 4-RVPPCR: NOT DETECTED
Parainfluenza Virus 2: NOT DETECTED
RHINOVIRUS / ENTEROVIRUS - RVPPCR: NOT DETECTED
Respiratory Syncytial Virus: NOT DETECTED

## 2016-08-27 LAB — BASIC METABOLIC PANEL
Anion gap: 9 (ref 5–15)
BUN: 13 mg/dL (ref 6–20)
CHLORIDE: 102 mmol/L (ref 101–111)
CO2: 25 mmol/L (ref 22–32)
Calcium: 8.4 mg/dL — ABNORMAL LOW (ref 8.9–10.3)
Creatinine, Ser: 0.99 mg/dL (ref 0.61–1.24)
GFR calc Af Amer: >60 mL/min (ref >60)
GFR calc non Af Amer: >60 mL/min (ref >60)
GLUCOSE: 101 mg/dL — AB (ref 65–99)
POTASSIUM: 3.5 mmol/L (ref 3.5–5.1)
Sodium: 136 mmol/L (ref 135–145)

## 2016-08-27 MED ORDER — DEXTROSE 5 % IV SOLN
1.0000 g | INTRAVENOUS | Status: DC
  Administered 2016-08-27: 1 g via INTRAVENOUS
  Filled 2016-08-27 (×4): qty 10

## 2016-08-27 MED ORDER — AZITHROMYCIN 250 MG PO TABS
500.0000 mg | ORAL_TABLET | Freq: Every day | ORAL | Status: DC
  Administered 2016-08-27: 500 mg via ORAL
  Filled 2016-08-27: qty 2

## 2016-08-27 NOTE — Care Management Important Message (Signed)
Important Message  Patient Details  Name: Duane [name] MRN: [id number] Date of Birth: Oct 07, 1960   Medicare Important Message Given:  Yes    Sherald Barge, RN 08/27/2016, 1:03 PM

## 2016-08-27 NOTE — Progress Notes (Signed)
  Echocardiogram 2D Echocardiogram has been performed.  Duane [name] 08/27/2016, 3:08 PM

## 2016-08-27 NOTE — Progress Notes (Signed)
PROGRESS NOTE  Duane [name]:[id number] DOB: 1960/03/27 DOA: 08/24/2016 PCP: [name], MD Brief History: 56 year old male with a history of hypertension, chronic pain syndrome, cardiac arthritis presented after a syncopal episode at the barbershop. The patient states that he has had lethargy, malaise, and decreased oral intake for the past 2-3 weeks with worsening over the past 3 days. In addition, the patient has had subjective fevers and chills over the same period of time. On 08/24/2016, the patient had an episode of dizziness and laid on the floor. EMS was activated. As the patient was getting up to stand, he had an episode of emesis and lost consciousness. Apparently he was unconscious for approximately 2 minutes. He had a similar episode one month ago. He denied any aura, bladder or bowel incontinence or tongue bite.Initial workup showed lactic acid 2.46 with fever 103.25F and tachycardia. The patient was started on vancomycin and Zosyn.  Vancomycin was ultimately discontinued. The patient continued fevers. CT of the chest revealed patchy opacities in the right lower lobe and groundglass opacities in the LUL, LLL.  Assessment/Plan: Sepsis/FUO -source unclear initially -appears to be atypical pneumonia -revised blood culture results-->no growth -Lactic acid peaked 2.46>>>1.5 -Procalcitonin--0.11 -Discontinued vancomycin -Urinalysis negative for pyuria -initial chest x-ray--negative for infiltrates or edema -viral respiratory panel -8/16--CT chest--patchy opacities RLL; and groundglass opacities in the LUL, LLL. -8/16--CT abd/pelvis--hepatic steatosis -am CBC--no leukocytosis -HIV negative -spiked fever 102.5 evening 8/16  Lobar Pneumonia --8/16--CT chest--patchy opacities RLL; and groundglass opacities in the LUL, LLL. -suspect atypical -d/c zosyn-->ceftriaxone -start azithromycin -awaiting viral respiratory panel -urine legionella  antigen  Syncope -secondary to vasovagal mechanism and volume depletion in setting of sepsis -Echo -reviewed telemetry--no concerning dysrhythmia  Hyperglycemia/Impaired glucose tolerance -Check hemoglobin A1c--6.3  Essential hypertension -Continue enalapril  Chronic pain syndrome -Continue home dose of Phenergan and Dilaudid  Hypokalemia -replete -check mag--1.9     Disposition Plan: Home in 1-2 days  Family Communication: No Family at bedside   Consultants: none  Code Status: FULL  DVT Prophylaxis: Atkinson Lovenox   Procedures: As Listed in Progress Note Above  Antibiotics: Vancomycin 8/14>>>8/15 Zosyn 8/14>>>8/17 Ceftriaxone 8/17>> Azithromycin 8/17>>    Subjective: Patient denies fevers, chills, headache, chest pain, dyspnea, nausea, vomiting, diarrhea, abdominal pain, dysuria, hematuria, hematochezia, and melena.   Objective: Vitals:   08/26/16 1700 08/26/16 2100 08/27/16 0100 08/27/16 0500  BP: 131/74 134/84 129/73 119/70  Pulse: 88 88 84 83  Resp: 20 20 19 18   Temp: 99.8 F (37.7 C) (!) 102.5 F (39.2 C) 99.2 F (37.3 C) 99.5 F (37.5 C)  TempSrc: Oral Oral Oral Oral  SpO2: 99% 95% 94% 96%  Weight:    66.7 kg (147 lb 0.8 oz)  Height:        Intake/Output Summary (Last 24 hours) at 08/27/16 1327 Last data filed at 08/27/16 0949  Gross per 24 hour  Intake              540 ml  Output                0 ml  Net              540 ml   Weight change:  Exam:   General:  Pt is alert, follows commands appropriately, not in acute distress  HEENT: No icterus, No thrush, No neck mass, [place]/AT  Cardiovascular: RRR, S1/S2, no rubs, no gallops  Respiratory: CTA bilaterally, no wheezing,  no crackles, no rhonchi  Abdomen: Soft/+BS, non tender, non distended, no guarding  Extremities: No edema, No lymphangitis, No petechiae, No rashes, no synovitis   Data Reviewed: I have personally reviewed following labs and imaging  studies Basic Metabolic Panel:  Recent Labs Lab 08/24/16 1503 08/25/16 0539 08/26/16 0420 08/27/16 0641  NA 133* 135 135 136  K 3.4* 3.4* 3.6 3.5  CL 103 104 102 102  CO2 21* 25 26 25   GLUCOSE 198* 133* 84 101*  BUN 13 12 10 13   CREATININE 1.17 0.99 0.98 0.99  CALCIUM 7.9* 7.8* 8.2* 8.4*  MG  --   --  1.9  --    Liver Function Tests:  Recent Labs Lab 08/24/16 1503  AST 25  ALT 18  ALKPHOS 54  BILITOT 1.1  PROT 6.9  ALBUMIN 3.5    Recent Labs Lab 08/24/16 1503  LIPASE 24   No results for input(s): AMMONIA in the last 168 hours. Coagulation Profile:  Recent Labs Lab 08/24/16 1503  INR 1.10   CBC:  Recent Labs Lab 08/24/16 1500 08/25/16 0539 08/27/16 0641  WBC 8.8 10.3 7.6  NEUTROABS 7.4  --  5.5  HGB 12.4* 11.4* 11.8*  HCT 35.9* 33.5* 34.2*  MCV 88.9 89.6 87.7  PLT 213 201 260   Cardiac Enzymes: No results for input(s): CKTOTAL, CKMB, CKMBINDEX, TROPONINI in the last 168 hours. BNP: Invalid input(s): POCBNP CBG:  Recent Labs Lab 08/24/16 1423  GLUCAP 185*   HbA1C:  Recent Labs  08/24/16 1503  HGBA1C 6.3*   Urine analysis:    Component Value Date/Time   COLORURINE AMBER (A) 08/24/2016 1436   APPEARANCEUR CLEAR 08/24/2016 1436   LABSPEC 1.018 08/24/2016 1436   PHURINE 5.0 08/24/2016 1436   GLUCOSEU NEGATIVE 08/24/2016 1436   HGBUR NEGATIVE 08/24/2016 1436   BILIRUBINUR NEGATIVE 08/24/2016 1436   KETONESUR NEGATIVE 08/24/2016 1436   PROTEINUR NEGATIVE 08/24/2016 1436   UROBILINOGEN 0.2 03/28/2008 1002   NITRITE NEGATIVE 08/24/2016 1436   LEUKOCYTESUR NEGATIVE 08/24/2016 1436   Sepsis Labs: @LABRCNTIP (procalcitonin:4,lacticidven:4) ) Recent Results (from the past 240 hour(s))  Urine culture     Status: None   Collection Time: 08/24/16  2:53 PM  Result Value Ref Range Status   Specimen Description URINE, CLEAN CATCH  Final   Special Requests Normal  Final   Culture   Final    NO GROWTH Performed at [facility], [place] [address], [place], [place] [address]    Report Status 08/26/2016 FINAL  Final  Culture, blood (routine x 2)     Status: None (Preliminary result)   Collection Time: 08/24/16  3:03 PM  Result Value Ref Range Status   Specimen Description BLOOD RIGHT HAND  Final   Special Requests   Final    BOTTLES DRAWN AEROBIC AND ANAEROBIC Blood Culture results may not be optimal due to an inadequate volume of blood received in culture bottles   Culture  Setup Time   Final    NO ORGANISMS SEEN CORRECTED RESULTS PREVIOUSLY REPORTED AS: GRAM NEGATIVE RODS CORRECTED RESULTS CALLED TO: L POOLE 08/25/16 @ 67 M VESTAL Performed at [facility], [place] [address] [place], [place] [address]    Culture NO GROWTH 3 DAYS  Final   Report Status PENDING  Incomplete  Culture, blood (routine x 2)     Status: None (Preliminary result)   Collection Time: 08/24/16  3:03 PM  Result Value Ref Range Status   Specimen Description  BLOOD RIGHT WRIST  Final   Special Requests   Final    BOTTLES DRAWN AEROBIC AND ANAEROBIC Blood Culture adequate volume   Culture NO GROWTH 3 DAYS  Final   Report Status PENDING  Incomplete     Scheduled Meds: . azithromycin  500 mg Oral Daily  . enalapril  10 mg Oral Daily  . enoxaparin (LOVENOX) injection  40 mg Subcutaneous Q24H  . loratadine  10 mg Oral Daily  . sodium chloride flush  3 mL Intravenous Q12H   Continuous Infusions: . cefTRIAXone (ROCEPHIN)  IV      Procedures/Studies: Dg Chest 2 View  Result Date: 08/24/2016 CLINICAL DATA:  Dizziness EXAM: CHEST  2 VIEW COMPARISON:  03/28/2008 FINDINGS: Heart and mediastinal contours are within normal limits. No focal opacities or effusions. No acute bony abnormality. IMPRESSION: No active cardiopulmonary disease. Electronically Signed   By: Rolm Baptise M.D.   On: 08/24/2016 15:46   Ct Chest W Contrast  Result Date: 08/27/2016 CLINICAL DATA:  Syncope, fever of unknown origin. Lethargy and malaise. Fever and chills.  EXAM: CT CHEST, ABDOMEN, AND PELVIS WITH CONTRAST TECHNIQUE: Multidetector CT imaging of the chest, abdomen and pelvis was performed following the standard protocol during bolus administration of intravenous contrast. CONTRAST:  119mL ISOVUE-300 IOPAMIDOL (ISOVUE-300) INJECTION 61% COMPARISON:  Chest radiograph 08/24/2016 FINDINGS: CT CHEST FINDINGS Cardiovascular: Unremarkable Mediastinum/Nodes: 1.2 cm right hilar node, image 23/2. Lungs/Pleura: Patchy airspace opacity in the right lower lobe, especially the superior segment, with some indistinct nodular components. Patchy ground-glass opacities posteriorly in the left upper lobe and in the left lower lobe medially. Musculoskeletal: Dysplastic right glenohumeral joint with some deformity of the right scapula. Abnormal right sternoclavicular joint with the medial clavicle along the posterior margin of the sternum rather than with a normal configuration. Atrophic musculature of the right shoulder and along the right hemithorax. CT ABDOMEN PELVIS FINDINGS Hepatobiliary: Subjectively low-density liver favor is hepatic steatosis. Borderline wall thickening in the gallbladder. No biliary dilatation. Pancreas: Unremarkable Spleen: Unremarkable Adrenals/Urinary Tract: Unremarkable Stomach/Bowel: Unremarkable Vascular/Lymphatic: Unremarkable Reproductive: Unremarkable Other: No supplemental non-categorized findings. Musculoskeletal: Unremarkable IMPRESSION: 1. Patchy airspace opacity in the right lower lobe with mild right hilar adenopathy, query pneumonia. There is some slightly nodular elements the airspace opacity which could reflect an atypical infectious component. There also some ground-glass opacities in the left lung which are faint but could reflect low-grade alveolitis. 2. Deformities along the right shoulder including dysplastic glenohumeral joint, abnormal alignment of the right sternoclavicular joint, and a scalloped appearance the right scapula with right  shoulder and right hemithoracic diffuse muscular atrophy. 3. Suspected hepatic steatosis. Electronically Signed   By: Van Clines M.D.   On: 08/27/2016 08:31   Ct Abdomen Pelvis W Contrast  Result Date: 08/27/2016 CLINICAL DATA:  Syncope, fever of unknown origin. Lethargy and malaise. Fever and chills. EXAM: CT CHEST, ABDOMEN, AND PELVIS WITH CONTRAST TECHNIQUE: Multidetector CT imaging of the chest, abdomen and pelvis was performed following the standard protocol during bolus administration of intravenous contrast. CONTRAST:  169mL ISOVUE-300 IOPAMIDOL (ISOVUE-300) INJECTION 61% COMPARISON:  Chest radiograph 08/24/2016 FINDINGS: CT CHEST FINDINGS Cardiovascular: Unremarkable Mediastinum/Nodes: 1.2 cm right hilar node, image 23/2. Lungs/Pleura: Patchy airspace opacity in the right lower lobe, especially the superior segment, with some indistinct nodular components. Patchy ground-glass opacities posteriorly in the left upper lobe and in the left lower lobe medially. Musculoskeletal: Dysplastic right glenohumeral joint with some deformity of the right scapula. Abnormal right sternoclavicular joint with the medial clavicle  along the posterior margin of the sternum rather than with a normal configuration. Atrophic musculature of the right shoulder and along the right hemithorax. CT ABDOMEN PELVIS FINDINGS Hepatobiliary: Subjectively low-density liver favor is hepatic steatosis. Borderline wall thickening in the gallbladder. No biliary dilatation. Pancreas: Unremarkable Spleen: Unremarkable Adrenals/Urinary Tract: Unremarkable Stomach/Bowel: Unremarkable Vascular/Lymphatic: Unremarkable Reproductive: Unremarkable Other: No supplemental non-categorized findings. Musculoskeletal: Unremarkable IMPRESSION: 1. Patchy airspace opacity in the right lower lobe with mild right hilar adenopathy, query pneumonia. There is some slightly nodular elements the airspace opacity which could reflect an atypical infectious  component. There also some ground-glass opacities in the left lung which are faint but could reflect low-grade alveolitis. 2. Deformities along the right shoulder including dysplastic glenohumeral joint, abnormal alignment of the right sternoclavicular joint, and a scalloped appearance the right scapula with right shoulder and right hemithoracic diffuse muscular atrophy. 3. Suspected hepatic steatosis. Electronically Signed   By: Van Clines M.D.   On: 08/27/2016 08:31    Tallen Schnorr, DO  [facility] Pager [phone]  If 7PM-7AM, please contact night-coverage [email or web address] Password [email or web address] 08/27/2016, 1:27 PM   LOS: 2 days

## 2016-08-27 NOTE — Progress Notes (Signed)
Pharmacy Antibiotic Note  Duane [name] is a 56 y.o. male admitted on 08/24/2016 with sepsis.  Pharmacy has been consulted for Zosyn dosing.  Initial doses given in ED.  PCN intolerance = Nausea.    Plan: Continue Zosyn 3.375gm IV every 8 hours. Follow-up micro data, labs, vitals.   Height: 5\' 9"  (175.3 cm) Weight: 147 lb 0.8 oz (66.7 kg) IBW/kg (Calculated) : 70.7  Temp (24hrs), Avg:99.9 F (37.7 C), Min:98.5 F (36.9 C), Max:102.5 F (39.2 C)   Recent Labs Lab 08/24/16 1500 08/24/16 1503 08/24/16 1513 08/24/16 1740 08/24/16 2137 08/25/16 0103 08/25/16 0539 08/26/16 0420 08/27/16 0641  WBC 8.8  --   --   --   --   --  10.3  --  7.6  CREATININE  --  1.17  --   --   --   --  0.99 0.98 0.99  LATICACIDVEN  --   --  2.46* 0.80 1.1 1.5  --   --   --     Estimated Creatinine Clearance: 80.5 mL/min (by C-G formula based on SCr of 0.99 mg/dL).    Allergies  Allergen Reactions  . Codeine   . Escitalopram Other (See Comments)    Patient states it caused night terrors  . Hydrocodone Nausea And Vomiting  . Oxycodone Nausea And Vomiting  . Penicillins Nausea Only    .Has patient had a PCN reaction causing immediate rash, facial/tongue/throat swelling, SOB or lightheadedness with hypotension: No Has patient had a PCN reaction causing severe rash involving mucus membranes or skin necrosis: No  Has patient had a PCN reaction that required hospitalization: No Has patient had a PCN reaction occurring within the last 10 years: No If all of the above answers are "NO", then may proceed with Cephalosporin use.   . Levofloxacin Rash   Antimicrobials this admission: Vanc 8/14 >> 8/15 Zosyn 8/14 >>   Dose adjustments this admission: n/a   Microbiology results: 8/14 BCx: ngtd. Previous cx showed GNR 8/15 changed to no growth 8/14 UCx: no growth 8/15 Sputum: pending 8/16 MRSA PCR: pending  Thank you for allowing pharmacy to be a part of this patient's care.  [name] 08/27/2016 12:39 PM

## 2016-08-28 DIAGNOSIS — J13 Pneumonia due to Streptococcus pneumoniae: Secondary | ICD-10-CM

## 2016-08-28 DIAGNOSIS — J189 Pneumonia, unspecified organism: Secondary | ICD-10-CM

## 2016-08-28 LAB — LEGIONELLA PNEUMOPHILA SEROGP 1 UR AG: L. pneumophila Serogp 1 Ur Ag: NEGATIVE

## 2016-08-28 MED ORDER — AZITHROMYCIN 500 MG PO TABS: 500.0000 mg | ORAL_TABLET | Freq: Every day | ORAL | 0 refills | Status: DC

## 2016-08-28 NOTE — Progress Notes (Signed)
Reviewed the medical record upon my arrival. I noted patient was no longer assigned to hospital room I spoke with charge nurse, and I was informed that the patient left against medical advice earlier this am prior to my arrival I called patient-->answer.  I Ieft message for patient that I e-prescribed azithromycin 500mg , #4, one po daily to his pharmacy on record.  I left message for him to pick up at his pharmacy, and I left my contact information should he have any additional questions.  DTat

## 2016-08-28 NOTE — Discharge Summary (Signed)
Physician Discharge Summary  Duane [name] LKT:[id number] DOB: [birth year] DOA: 08/24/2016  PCP: [name], MD  Admit date: 08/24/2016 Discharge date: 08/28/2016  Admitted From: Home Disposition:  [facility] [place]  Recommendations for Outpatient Follow-up:  [place] ADVICE     Discharge Condition: AMA CODE STATUS: FULL Diet recommendation: Heart Healthy   Brief/Interim Summary: 56 year old male with a history of hypertension, chronic pain syndrome, cardiac arthritis presented after a syncopal episode at the [employer]hop. The patient states that he has had lethargy, malaise, and decreased oral intake for the past 2-3 weeks with worsening over the past 3 days. In addition, the patient has had subjective fevers and chills over the same period of time. On 08/24/2016, the patient had an episode of dizziness and laid on the floor. EMS was activated. As the patient was getting up to stand, he had an episode of emesis and lost consciousness. Apparently he was unconscious for approximately 2 minutes. He had a similar episode one month ago. He denied any aura, bladder or bowel incontinence or tongue bite.Initial workup showed lactic acid 2.46 with fever 103.101F and tachycardia.Blood cultures and urine cultures were obtained.  UA showed no pyuria. The patient was started on vancomycin and Zosyn.  Vancomycin was ultimately discontinued one preliminary blood cultures grew gram-negative rods. However, within 24 hours, the blood culture results were updated and revised to show there was no growth in both sets of blood cultures. The patient continued fevers despite Zosyn.  As a result, workup for fever of unknown origin was undertaken. CT of the chest, abdomen, and pelvis were obtained. In addition, viral respiratory panel was also obtained. CT of the chest revealed patchy opacities in the right lower lobe and groundglass opacities in the LUL, LLL.  The  patient's Zosyn was discontinued, and he was changed to ceftriaxone and azithromycin Echocardiogram was unremarkable and without vegetations. Ultimately, the patient's respiratory panel returned and was positive for Mycoplasma pneumoniae. Therefore, the patient's azithromycin was continued. However, on the very early morning of 08/28/2016, the patient left [place] prior evaluation by physician. The patient was contacted, but there was no answer. A voice mail was left to instruct the patient to pick up a course of azithromycin that was e-prescribed to his pharmacy.  Discharge Diagnoses:  Sepsis/FUO -source unclear initially -appears to be atypical pneumonia -revised blood culture results-->no growth -Lactic acid peaked 2.46>>>1.5 -Procalcitonin--0.11 -Discontinued vancomycin -Urinalysis negative for pyuria -initial chest x-ray--negative for infiltrates or edema -8/16--CT chest--patchy opacities RLL; and groundglass opacities in the LUL, LLL. -8/16--CT abd/pelvis--hepatic steatosis -8/16--viral respiratory panel resulted late 08/27/16-->Mycoplasma -continue course of azithromycin -HIV negative  Lobar Pneumonia--Mycoplasma pneumoniae --8/16--CT chest--patchy opacities RLL; and groundglass opacities in the LUL, LLL. -suspect atypical -d/c zosyn-->ceftriaxone -finish azithromycin -8/16--viral respiratory panel resulted late 08/27/16-->Mycoplasma -urine legionella antigen--pending at time of AMA  Syncope -secondary to vasovagal mechanism and volume depletion in setting of sepsis -Echo--EF 60-65% -reviewed telemetry--no concerning dysrhythmia  Hyperglycemia/Impaired glucose tolerance -Check hemoglobin A1c--6.3  Essential hypertension -Continue enalapril  Chronic pain syndrome -Continue home dose of Phenergan and Dilaudid  Hypokalemia -replete -check mag--1.9   Discharge Instructions   Allergies as of 08/28/2016      Reactions   Codeine    Escitalopram  Other (See Comments)   Patient states it caused night terrors   Hydrocodone Nausea And Vomiting   Oxycodone Nausea And Vomiting   Penicillins Nausea Only   .Has patient had a PCN reaction causing immediate rash,  facial/tongue/throat swelling, SOB or lightheadedness with hypotension: No Has patient had a PCN reaction causing severe rash involving mucus membranes or skin necrosis: No  Has patient had a PCN reaction that required hospitalization: No Has patient had a PCN reaction occurring within the last 10 years: No If all of the above answers are "NO", then may proceed with Cephalosporin use.   Levofloxacin Rash      Medication List    TAKE these medications   acetaminophen 650 MG CR tablet Commonly known as:  TYLENOL Take 1,300 mg by mouth every 8 (eight) hours as needed for pain.   azithromycin 500 MG tablet Commonly known as:  ZITHROMAX Take 1 tablet (500 mg total) by mouth daily.   cetirizine 10 MG tablet Commonly known as:  ZYRTEC Take 10 mg by mouth daily.   dextromethorphan-guaiFENesin 10-100 MG/5ML liquid Commonly known as:  ROBITUSSIN-DM Take 10 mLs by mouth every 4 (four) hours as needed for cough.   enalapril 10 MG tablet Commonly known as:  VASOTEC 10 mg tablet; oral every day   HYDROmorphone 4 MG tablet Commonly known as:  DILAUDID Take 1 tablet (4 mg total) by mouth every 4 (four) hours as needed for severe pain. Do not fill until 11/24/15   indomethacin 50 MG capsule Commonly known as:  INDOCIN Take 50 mg by mouth 3 (three) times daily as needed.   promethazine 25 MG tablet Commonly known as:  PHENERGAN Take 0.5 tablets (12.5 mg total) by mouth every 6 (six) hours as needed for nausea. What changed:  how much to take   tadalafil 20 MG tablet Commonly known as:  CIALIS Take 0.5-1 tablets (10-20 mg total) by mouth every other day as needed for erectile dysfunction.       Allergies  Allergen Reactions  . Codeine   . Escitalopram Other (See  Comments)    Patient states it caused night terrors  . Hydrocodone Nausea And Vomiting  . Oxycodone Nausea And Vomiting  . Penicillins Nausea Only    .Has patient had a PCN reaction causing immediate rash, facial/tongue/throat swelling, SOB or lightheadedness with hypotension: No Has patient had a PCN reaction causing severe rash involving mucus membranes or skin necrosis: No  Has patient had a PCN reaction that required hospitalization: No Has patient had a PCN reaction occurring within the last 10 years: No If all of the above answers are "NO", then may proceed with Cephalosporin use.   . Levofloxacin Rash    Consultations:  none   Procedures/Studies: Dg Chest 2 View  Result Date: 08/24/2016 CLINICAL DATA:  Dizziness EXAM: CHEST  2 VIEW COMPARISON:  03/28/2008 FINDINGS: Heart and mediastinal contours are within normal limits. No focal opacities or effusions. No acute bony abnormality. IMPRESSION: No active cardiopulmonary disease. Electronically Signed   By: Rolm Baptise M.D.   On: 08/24/2016 15:46   Ct Chest W Contrast  Result Date: 08/27/2016 CLINICAL DATA:  Syncope, fever of unknown origin. Lethargy and malaise. Fever and chills. EXAM: CT CHEST, ABDOMEN, AND PELVIS WITH CONTRAST TECHNIQUE: Multidetector CT imaging of the chest, abdomen and pelvis was performed following the standard protocol during bolus administration of intravenous contrast. CONTRAST:  138mL ISOVUE-300 IOPAMIDOL (ISOVUE-300) INJECTION 61% COMPARISON:  Chest radiograph 08/24/2016 FINDINGS: CT CHEST FINDINGS Cardiovascular: Unremarkable Mediastinum/Nodes: 1.2 cm right hilar node, image 23/2. Lungs/Pleura: Patchy airspace opacity in the right lower lobe, especially the superior segment, with some indistinct nodular components. Patchy ground-glass opacities posteriorly in the left upper lobe and in  the left lower lobe medially. Musculoskeletal: Dysplastic right glenohumeral joint with some deformity of the right scapula.  Abnormal right sternoclavicular joint with the medial clavicle along the posterior margin of the sternum rather than with a normal configuration. Atrophic musculature of the right shoulder and along the right hemithorax. CT ABDOMEN PELVIS FINDINGS Hepatobiliary: Subjectively low-density liver favor is hepatic steatosis. Borderline wall thickening in the gallbladder. No biliary dilatation. Pancreas: Unremarkable Spleen: Unremarkable Adrenals/Urinary Tract: Unremarkable Stomach/Bowel: Unremarkable Vascular/Lymphatic: Unremarkable Reproductive: Unremarkable Other: No supplemental non-categorized findings. Musculoskeletal: Unremarkable IMPRESSION: 1. Patchy airspace opacity in the right lower lobe with mild right hilar adenopathy, query pneumonia. There is some slightly nodular elements the airspace opacity which could reflect an atypical infectious component. There also some ground-glass opacities in the left lung which are faint but could reflect low-grade alveolitis. 2. Deformities along the right shoulder including dysplastic glenohumeral joint, abnormal alignment of the right sternoclavicular joint, and a scalloped appearance the right scapula with right shoulder and right hemithoracic diffuse muscular atrophy. 3. Suspected hepatic steatosis. Electronically Signed   By: Van Clines M.D.   On: 08/27/2016 08:31   Ct Abdomen Pelvis W Contrast  Result Date: 08/27/2016 CLINICAL DATA:  Syncope, fever of unknown origin. Lethargy and malaise. Fever and chills. EXAM: CT CHEST, ABDOMEN, AND PELVIS WITH CONTRAST TECHNIQUE: Multidetector CT imaging of the chest, abdomen and pelvis was performed following the standard protocol during bolus administration of intravenous contrast. CONTRAST:  160mL ISOVUE-300 IOPAMIDOL (ISOVUE-300) INJECTION 61% COMPARISON:  Chest radiograph 08/24/2016 FINDINGS: CT CHEST FINDINGS Cardiovascular: Unremarkable Mediastinum/Nodes: 1.2 cm right hilar node, image 23/2. Lungs/Pleura: Patchy  airspace opacity in the right lower lobe, especially the superior segment, with some indistinct nodular components. Patchy ground-glass opacities posteriorly in the left upper lobe and in the left lower lobe medially. Musculoskeletal: Dysplastic right glenohumeral joint with some deformity of the right scapula. Abnormal right sternoclavicular joint with the medial clavicle along the posterior margin of the sternum rather than with a normal configuration. Atrophic musculature of the right shoulder and along the right hemithorax. CT ABDOMEN PELVIS FINDINGS Hepatobiliary: Subjectively low-density liver favor is hepatic steatosis. Borderline wall thickening in the gallbladder. No biliary dilatation. Pancreas: Unremarkable Spleen: Unremarkable Adrenals/Urinary Tract: Unremarkable Stomach/Bowel: Unremarkable Vascular/Lymphatic: Unremarkable Reproductive: Unremarkable Other: No supplemental non-categorized findings. Musculoskeletal: Unremarkable IMPRESSION: 1. Patchy airspace opacity in the right lower lobe with mild right hilar adenopathy, query pneumonia. There is some slightly nodular elements the airspace opacity which could reflect an atypical infectious component. There also some ground-glass opacities in the left lung which are faint but could reflect low-grade alveolitis. 2. Deformities along the right shoulder including dysplastic glenohumeral joint, abnormal alignment of the right sternoclavicular joint, and a scalloped appearance the right scapula with right shoulder and right hemithoracic diffuse muscular atrophy. 3. Suspected hepatic steatosis. Electronically Signed   By: Van Clines M.D.   On: 08/27/2016 08:31        Discharge Exam: Vitals:   08/27/16 2100 08/28/16 0500  BP: 124/72 121/80  Pulse: 76 79  Resp: 18 18  Temp: 99.6 F (37.6 C) 98.8 F (37.1 C)  SpO2: 100% 98%   Vitals:   08/27/16 0500 08/27/16 1418 08/27/16 2100 08/28/16 0500  BP: 119/70 118/77 124/72 121/80  Pulse: 83  87 76 79  Resp: 18 18 18 18   Temp: 99.5 F (37.5 C) 99.8 F (37.7 C) 99.6 F (37.6 C) 98.8 F (37.1 C)  TempSrc: Oral Oral Oral Oral  SpO2: 96% 95% 100% 98%  Weight: 66.7 kg (147 lb 0.8 oz)   65.4 kg (144 lb 1.6 oz)  Height:           The results of significant diagnostics from this hospitalization (including imaging, microbiology, ancillary and laboratory) are listed below for reference.    Significant Diagnostic Studies: Dg Chest 2 View  Result Date: 08/24/2016 CLINICAL DATA:  Dizziness EXAM: CHEST  2 VIEW COMPARISON:  03/28/2008 FINDINGS: Heart and mediastinal contours are within normal limits. No focal opacities or effusions. No acute bony abnormality. IMPRESSION: No active cardiopulmonary disease. Electronically Signed   By: Rolm Baptise M.D.   On: 08/24/2016 15:46   Ct Chest W Contrast  Result Date: 08/27/2016 CLINICAL DATA:  Syncope, fever of unknown origin. Lethargy and malaise. Fever and chills. EXAM: CT CHEST, ABDOMEN, AND PELVIS WITH CONTRAST TECHNIQUE: Multidetector CT imaging of the chest, abdomen and pelvis was performed following the standard protocol during bolus administration of intravenous contrast. CONTRAST:  142mL ISOVUE-300 IOPAMIDOL (ISOVUE-300) INJECTION 61% COMPARISON:  Chest radiograph 08/24/2016 FINDINGS: CT CHEST FINDINGS Cardiovascular: Unremarkable Mediastinum/Nodes: 1.2 cm right hilar node, image 23/2. Lungs/Pleura: Patchy airspace opacity in the right lower lobe, especially the superior segment, with some indistinct nodular components. Patchy ground-glass opacities posteriorly in the left upper lobe and in the left lower lobe medially. Musculoskeletal: Dysplastic right glenohumeral joint with some deformity of the right scapula. Abnormal right sternoclavicular joint with the medial clavicle along the posterior margin of the sternum rather than with a normal configuration. Atrophic musculature of the right shoulder and along the right hemithorax. CT ABDOMEN  PELVIS FINDINGS Hepatobiliary: Subjectively low-density liver favor is hepatic steatosis. Borderline wall thickening in the gallbladder. No biliary dilatation. Pancreas: Unremarkable Spleen: Unremarkable Adrenals/Urinary Tract: Unremarkable Stomach/Bowel: Unremarkable Vascular/Lymphatic: Unremarkable Reproductive: Unremarkable Other: No supplemental non-categorized findings. Musculoskeletal: Unremarkable IMPRESSION: 1. Patchy airspace opacity in the right lower lobe with mild right hilar adenopathy, query pneumonia. There is some slightly nodular elements the airspace opacity which could reflect an atypical infectious component. There also some ground-glass opacities in the left lung which are faint but could reflect low-grade alveolitis. 2. Deformities along the right shoulder including dysplastic glenohumeral joint, abnormal alignment of the right sternoclavicular joint, and a scalloped appearance the right scapula with right shoulder and right hemithoracic diffuse muscular atrophy. 3. Suspected hepatic steatosis. Electronically Signed   By: Van Clines M.D.   On: 08/27/2016 08:31   Ct Abdomen Pelvis W Contrast  Result Date: 08/27/2016 CLINICAL DATA:  Syncope, fever of unknown origin. Lethargy and malaise. Fever and chills. EXAM: CT CHEST, ABDOMEN, AND PELVIS WITH CONTRAST TECHNIQUE: Multidetector CT imaging of the chest, abdomen and pelvis was performed following the standard protocol during bolus administration of intravenous contrast. CONTRAST:  163mL ISOVUE-300 IOPAMIDOL (ISOVUE-300) INJECTION 61% COMPARISON:  Chest radiograph 08/24/2016 FINDINGS: CT CHEST FINDINGS Cardiovascular: Unremarkable Mediastinum/Nodes: 1.2 cm right hilar node, image 23/2. Lungs/Pleura: Patchy airspace opacity in the right lower lobe, especially the superior segment, with some indistinct nodular components. Patchy ground-glass opacities posteriorly in the left upper lobe and in the left lower lobe medially. Musculoskeletal:  Dysplastic right glenohumeral joint with some deformity of the right scapula. Abnormal right sternoclavicular joint with the medial clavicle along the posterior margin of the sternum rather than with a normal configuration. Atrophic musculature of the right shoulder and along the right hemithorax. CT ABDOMEN PELVIS FINDINGS Hepatobiliary: Subjectively low-density liver favor is hepatic steatosis. Borderline wall thickening in the gallbladder. No biliary dilatation. Pancreas: Unremarkable Spleen: Unremarkable Adrenals/Urinary Tract: Unremarkable  Stomach/Bowel: Unremarkable Vascular/Lymphatic: Unremarkable Reproductive: Unremarkable Other: No supplemental non-categorized findings. Musculoskeletal: Unremarkable IMPRESSION: 1. Patchy airspace opacity in the right lower lobe with mild right hilar adenopathy, query pneumonia. There is some slightly nodular elements the airspace opacity which could reflect an atypical infectious component. There also some ground-glass opacities in the left lung which are faint but could reflect low-grade alveolitis. 2. Deformities along the right shoulder including dysplastic glenohumeral joint, abnormal alignment of the right sternoclavicular joint, and a scalloped appearance the right scapula with right shoulder and right hemithoracic diffuse muscular atrophy. 3. Suspected hepatic steatosis. Electronically Signed   By: Van Clines M.D.   On: 08/27/2016 08:31     Microbiology: Recent Results (from the past 240 hour(s))  Urine culture     Status: None   Collection Time: 08/24/16  2:53 PM  Result Value Ref Range Status   Specimen Description URINE, CLEAN CATCH  Final   Special Requests Normal  Final   Culture   Final    NO GROWTH Performed at [facility], [address] [place], [place] [address]    Report Status 08/26/2016 FINAL  Final  Culture, blood (routine x 2)     Status: None (Preliminary result)   Collection Time: 08/24/16  3:03 PM  Result Value Ref  Range Status   Specimen Description BLOOD RIGHT HAND  Final   Special Requests   Final    BOTTLES DRAWN AEROBIC AND ANAEROBIC Blood Culture results may not be optimal due to an inadequate volume of blood received in culture bottles   Culture  Setup Time   Final    NO ORGANISMS SEEN CORRECTED RESULTS PREVIOUSLY REPORTED AS: GRAM NEGATIVE RODS CORRECTED RESULTS CALLED TO: L POOLE 08/25/16 @ 21 M VESTAL Performed at [facility], [place] [address], [place], [place] [address]    Culture NO GROWTH 3 DAYS  Final   Report Status PENDING  Incomplete  Culture, blood (routine x 2)     Status: None (Preliminary result)   Collection Time: 08/24/16  3:03 PM  Result Value Ref Range Status   Specimen Description BLOOD RIGHT WRIST  Final   Special Requests   Final    BOTTLES DRAWN AEROBIC AND ANAEROBIC Blood Culture adequate volume   Culture NO GROWTH 3 DAYS  Final   Report Status PENDING  Incomplete  Respiratory Panel by PCR     Status: Abnormal   Collection Time: 08/26/16  6:35 PM  Result Value Ref Range Status   Adenovirus NOT DETECTED NOT DETECTED Final   Coronavirus 229E NOT DETECTED NOT DETECTED Final   Coronavirus HKU1 NOT DETECTED NOT DETECTED Final   Coronavirus NL63 NOT DETECTED NOT DETECTED Final   Coronavirus OC43 NOT DETECTED NOT DETECTED Final   Metapneumovirus NOT DETECTED NOT DETECTED Final   Rhinovirus / Enterovirus NOT DETECTED NOT DETECTED Final   Influenza A NOT DETECTED NOT DETECTED Final   Influenza A H1 NOT DETECTED NOT DETECTED Final   Influenza A H1 2009 NOT DETECTED NOT DETECTED Final   Influenza A H3 NOT DETECTED NOT DETECTED Final   Influenza B NOT DETECTED NOT DETECTED Final   Parainfluenza Virus 1 NOT DETECTED NOT DETECTED Final   Parainfluenza Virus 2 NOT DETECTED NOT DETECTED Final   Parainfluenza Virus 3 NOT DETECTED NOT DETECTED Final   Parainfluenza Virus 4 NOT DETECTED NOT DETECTED Final   Respiratory Syncytial Virus NOT DETECTED NOT DETECTED Final    Bordetella pertussis NOT DETECTED NOT DETECTED  Final   Chlamydophila pneumoniae NOT DETECTED NOT DETECTED Final   Mycoplasma pneumoniae DETECTED (A) NOT DETECTED Final    Comment: Performed at [facility], [place] [address], [place], [place] [address]     Labs: Basic Metabolic Panel:  Recent Labs Lab 08/24/16 1503 08/25/16 0539 08/26/16 0420 08/27/16 0641  NA 133* 135 135 136  K 3.4* 3.4* 3.6 3.5  CL 103 104 102 102  CO2 21* 25 26 25   GLUCOSE 198* 133* 84 101*  BUN 13 12 10 13   CREATININE 1.17 0.99 0.98 0.99  CALCIUM 7.9* 7.8* 8.2* 8.4*  MG  --   --  1.9  --    Liver Function Tests:  Recent Labs Lab 08/24/16 1503  AST 25  ALT 18  ALKPHOS 54  BILITOT 1.1  PROT 6.9  ALBUMIN 3.5    Recent Labs Lab 08/24/16 1503  LIPASE 24   No results for input(s): AMMONIA in the last 168 hours. CBC:  Recent Labs Lab 08/24/16 1500 08/25/16 0539 08/27/16 0641  WBC 8.8 10.3 7.6  NEUTROABS 7.4  --  5.5  HGB 12.4* 11.4* 11.8*  HCT 35.9* 33.5* 34.2*  MCV 88.9 89.6 87.7  PLT 213 201 260   Cardiac Enzymes: No results for input(s): CKTOTAL, CKMB, CKMBINDEX, TROPONINI in the last 168 hours. BNP: Invalid input(s): POCBNP CBG:  Recent Labs Lab 08/24/16 1423  GLUCAP 185*    Time coordinating discharge:  Greater than 30 minutes  Signed:  Seynabou Fults, DO [facility] Pager: [phone] 08/28/2016, 7:50 AM

## 2016-08-28 NOTE — Progress Notes (Signed)
RN at above time was called to patient's room by lab personnel. She informed primary RN that patient's  IV was out and there was blood all over the bed and room. Primary nurse immediatly went to room to assist patient.  On arrival to room Patient stated he had taken his IV out as he intended to leave. He admitted that he felt he could not get any rest at the hospital. He stated he had been thinking of leaving all week. I attempted to encourage patient to stay  and conference with  treating physician about his concerns  he refused. Charge nurse Valentino Nose Odin) spoke with patient and verified he had a ride home to which he admitted he did. AMA papers were signed and patient left Via staff elevators.

## 2016-08-29 LAB — CULTURE, BLOOD (ROUTINE X 2)
Culture  Setup Time: NONE SEEN
Culture: NO GROWTH
Culture: NO GROWTH
Special Requests: ADEQUATE

## 2016-09-01 ENCOUNTER — Encounter: Payer: Self-pay | Admitting: Emergency Medicine

## 2016-09-01 ENCOUNTER — Ambulatory Visit (INDEPENDENT_AMBULATORY_CARE_PROVIDER_SITE_OTHER): Payer: Medicare Other | Admitting: Emergency Medicine

## 2016-09-01 VITALS — BP 136/86 | HR 75 | Temp 98.2°F | Resp 16 | Ht 70.0 in | Wt 151.4 lb

## 2016-09-01 DIAGNOSIS — R05 Cough: Secondary | ICD-10-CM | POA: Diagnosis not present

## 2016-09-01 DIAGNOSIS — J157 Pneumonia due to Mycoplasma pneumoniae: Secondary | ICD-10-CM | POA: Diagnosis not present

## 2016-09-01 DIAGNOSIS — R059 Cough, unspecified: Secondary | ICD-10-CM

## 2016-09-01 DIAGNOSIS — Z09 Encounter for follow-up examination after completed treatment for conditions other than malignant neoplasm: Secondary | ICD-10-CM | POA: Diagnosis not present

## 2016-09-01 MED ORDER — BENZONATATE 200 MG PO CAPS: 200.0000 mg | ORAL_CAPSULE | Freq: Two times a day (BID) | ORAL | 0 refills | Status: DC | PRN

## 2016-09-01 MED ORDER — AZITHROMYCIN 250 MG PO TABS: ORAL_TABLET | ORAL | 0 refills | Status: DC

## 2016-09-01 NOTE — Progress Notes (Signed)
LANSON RANDLE 56 y.o.   Chief Complaint  Patient presents with  . Follow-up    [place]  . Cough    NONPRODUCTIVE  . Depression    SCORE -21    HISTORY OF PRESENT ILLNESS: This is a 56 y.o. male recently in the Hospital; left 4 days ago; admitted after syncopal episode; had fever and chills; suspected sepsis but negative cultures; respiratory panel by pcr positive for mycoplasma; CT chest positive for atypical pneumonia. Feels 50% better; still has lingering cough. Fever gone. Overall feels better. States depression not too bad; stopped Lexapro prescribed last time; does not want anything else. HPI   Prior to Admission medications   Medication Sig Start Date End Date Taking? Authorizing Provider  acetaminophen (TYLENOL) 650 MG CR tablet Take 1,300 mg by mouth every 8 (eight) hours as needed for pain.   Yes [provider]  enalapril (VASOTEC) 10 MG tablet 10 mg tablet; oral every day 07/02/16  Yes Rommel Hogston, [name], MD  HYDROmorphone (DILAUDID) 4 MG tablet Take 1 tablet (4 mg total) by mouth every 4 (four) hours as needed for severe pain. Do not fill until 11/24/15 04/07/16  Yes [name], MD  indomethacin (INDOCIN) 50 MG capsule Take 50 mg by mouth 3 (three) times daily as needed.   Yes [provider]  tadalafil (CIALIS) 20 MG tablet Take 0.5-1 tablets (10-20 mg total) by mouth every other day as needed for erectile dysfunction. 07/02/16  Yes Oletta Buehring, [name], MD  cetirizine (ZYRTEC) 10 MG tablet Take 10 mg by mouth daily.    [provider]  promethazine (PHENERGAN) 25 MG tablet Take 0.5 tablets (12.5 mg total) by mouth every 6 (six) hours as needed for nausea. Patient taking differently: Take 25 mg by mouth every 6 (six) hours as needed for nausea.  07/02/16 08/24/16  [name], MD    Allergies  Allergen Reactions  . Codeine   . Escitalopram Other (See Comments)    Patient states it caused night terrors  .  Hydrocodone Nausea And Vomiting  . Oxycodone Nausea And Vomiting  . Penicillins Nausea Only    .Has patient had a PCN reaction causing immediate rash, facial/tongue/throat swelling, SOB or lightheadedness with hypotension: No Has patient had a PCN reaction causing severe rash involving mucus membranes or skin necrosis: No  Has patient had a PCN reaction that required hospitalization: No Has patient had a PCN reaction occurring within the last 10 years: No If all of the above answers are "NO", then may proceed with Cephalosporin use.   . Levofloxacin Rash    Patient Active Problem List   Diagnosis Date Noted  . Lobar pneumonia ([place]) 08/27/2016  . Fever, unknown origin 08/26/2016  . Sepsis due to undetermined organism ([place]) 08/25/2016  . Gram negative sepsis ([place]) 08/25/2016  . Bacteremia due to Gram-negative bacteria 08/25/2016  . Syncope 08/25/2016  . Syncope and collapse 08/24/2016  . Sepsis due to pneumonia ([place]) 08/24/2016  . Hyperglycemia 08/24/2016  . Nausea without vomiting 07/02/2016  . Erectile dysfunction 07/02/2016  . SLAP lesion of shoulder 08/04/2012  . Muscle weakness (generalized) 08/04/2012  . Chronic pain 08/31/2011  . Deformity, acquired 08/31/2011  . Essential hypertension 08/31/2011  . Insomnia 08/31/2011  . Epigastric discomfort 05/03/2011  . LUMBAR SPRAIN AND STRAIN 10/22/2009  . CUBITAL TUNNEL SYNDROME 10/24/2007  . SHOULDER PAIN 10/24/2007  . 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 ELBOW PAIN 05/25/2007    Past Medical  History:  Diagnosis Date  . Arthritis   . Gout   . Hypertension     Past Surgical History:  Procedure Laterality Date  . ELBOW SURGERY    . FOOT FASCIOTOMY     X 5  . HEMORRHOID SURGERY     X2, Dr. Arnoldo Morale  . NECK EXPLORATION    . SHOULDER ARTHROTOMY     multiple     Social History   Social History  . Marital status: Single    Spouse name: N/A  . Number of children: N/A  . 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 Smoking status: Never Smoker  . Smokeless tobacco: Never Used  . Alcohol use No  . Drug use: No  . Sexual activity: No   Other Topics Concern  . Not on file   Social History Narrative  . No narrative on file    Family History  Problem Relation Age of Onset  . Diabetes Mother   . COPD Father   . Colon cancer Neg Hx      Review of Systems  Constitutional: Negative.  Negative for chills and fever.  HENT: Negative.  Negative for sore throat.   Eyes: Negative.  Negative for discharge and redness.  Respiratory: Positive for cough. Negative for hemoptysis, sputum production, shortness of breath and wheezing.   Cardiovascular: Negative.  Negative for chest pain and palpitations.  Gastrointestinal: Negative.  Negative for abdominal pain, blood in stool, constipation, diarrhea, nausea and vomiting.  Genitourinary: Negative.  Negative for hematuria.  Musculoskeletal: Negative.  Negative for back pain, myalgias and neck pain.  Skin: Negative.  Negative for rash.  Neurological: Negative.  Negative for dizziness and headaches.  Endo/Heme/Allergies: Negative.   All other systems reviewed and are negative.  Vitals:   09/01/16 1423  BP: 136/86  Pulse: 75  Resp: 16  Temp: 98.2 F (36.8 C)  SpO2: 97%     Physical Exam  Constitutional: He is oriented to person, place, and time. He appears well-developed and well-nourished.  HENT:  Head: Normocephalic and atraumatic.  Nose: Nose normal.  Mouth/Throat: Oropharynx is clear and moist.  Eyes: Pupils are equal, round, and reactive to light. Conjunctivae are normal.  Neck: Normal range of motion. Neck supple. No JVD present. No thyromegaly present.  Cardiovascular: Normal rate, regular rhythm, normal heart sounds and intact distal pulses.   Pulmonary/Chest: Effort normal and breath sounds normal.  Abdominal: Soft. Bowel sounds are normal.    Lymphadenopathy:    He has no cervical adenopathy.  Neurological: He is alert and oriented to person, place, and time.  Skin: Skin is warm and dry. Capillary refill takes less than 2 seconds. No rash noted.  Psychiatric: He has a normal mood and affect. His behavior is normal.  Vitals reviewed.    ASSESSMENT & PLAN: Duane [name] was seen today for follow-up, cough and depression.  Diagnoses and all orders for this visit:  Pneumonia due to Mycoplasma pneumoniae, unspecified laterality, unspecified part of lung Comments: improving  Hospital discharge follow-up  Cough  Other orders -     azithromycin (ZITHROMAX) 250 MG tablet; Sig as indicated -     benzonatate (TESSALON) 200 MG capsule; Take 1 capsule (200 mg total) by mouth 2 (two) times daily as needed for cough.     Patient Instructions       IF you received an x-ray today, you will  receive an Pharmacologist from [facility]. Please contact [facility] at [phone] with questions or concerns regarding your invoice.   IF you received labwork today, you will receive an invoice from [facility]. Please contact [facility] at [phone] with questions or concerns regarding your invoice.   Our billing staff will not be able to assist you with questions regarding bills from these companies.  You will be contacted with the lab results as soon as they are available. The fastest way to get your results is to activate your My Chart account. Instructions are located on the last page of this paperwork. If you have not heard from [place] regarding the results in 2 weeks, please contact this office.     Community-Acquired Pneumonia, Adult Pneumonia is an infection of the lungs. One type of pneumonia can happen while a person is in a hospital. A different type can happen when a person is not in a hospital (community-acquired pneumonia). It is easy for this kind to spread from person to person. It can spread to you if you breathe near  an infected person who coughs or sneezes. Some symptoms include:  A dry cough.  A wet (productive) cough.  Fever.  Sweating.  Chest pain.  Follow these instructions at home:  Take over-the-counter and prescription medicines only as told by your doctor. ? Only take cough medicine if you are losing sleep. ? If you were prescribed an antibiotic medicine, take it as told by your doctor. Do not stop taking the antibiotic even if you start to feel better.  Sleep with your head and neck raised (elevated). You can do this by putting a few pillows under your head, or you can sleep in a recliner.  Do not use tobacco products. These include cigarettes, chewing tobacco, and e-cigarettes. If you need help quitting, ask your doctor.  Drink enough water to keep your pee (urine) clear or pale yellow. A shot (vaccine) can help prevent pneumonia. Shots are often suggested for:  People older than 54 years of age.  People older than 54 years of age: ? Who are having cancer treatment. ? Who have long-term (chronic) lung disease. ? Who have problems with their body's defense system (immune system).  You may also prevent pneumonia if you take these actions:  Get the flu (influenza) shot every year.  Go to the dentist as often as told.  Wash your hands often. If soap and water are not available, use hand sanitizer.  Contact a doctor if:  You have a fever.  You lose sleep because your cough medicine does not help. Get help right away if:  You are short of breath and it gets worse.  You have more chest pain.  Your sickness gets worse. This is very serious if: ? You are an older adult. ? Your body's defense system is weak.  You cough up blood. This information is not intended to replace advice given to you by your health care provider. Make sure you discuss any questions you have with your health care provider. Document Released: 06/16/2007 Document Revised: 06/05/2015 Document  Reviewed: 04/24/2014 Elsevier Interactive Patient Education  2018 Elsevier Inc.     Agustina Caroli, MD [facility]

## 2016-09-01 NOTE — Patient Instructions (Addendum)
     IF you received an x-ray today, you will receive an invoice from [facility]. Please contact  Radiology at [phone] with questions or concerns regarding your invoice.   IF you received labwork today, you will receive an invoice from [facility]. Please contact [facility] at [phone] with questions or concerns regarding your invoice.   Our billing staff will not be able to assist you with questions regarding bills from these companies.  You will be contacted with the lab results as soon as they are available. The fastest way to get your results is to activate your My Chart account. Instructions are located on the last page of this paperwork. If you have not heard from us regarding the results in 2 weeks, please contact this office.     Community-Acquired Pneumonia, Adult Pneumonia is an infection of the lungs. One type of pneumonia can happen while a person is in a hospital. A different type can happen when a person is not in a hospital (community-acquired pneumonia). It is easy for this kind to spread from person to person. It can spread to you if you breathe near an infected person who coughs or sneezes. Some symptoms include:  A dry cough.  A wet (productive) cough.  Fever.  Sweating.  Chest pain.  Follow these instructions at home:  Take over-the-counter and prescription medicines only as told by your doctor. ? Only take cough medicine if you are losing sleep. ? If you were prescribed an antibiotic medicine, take it as told by your doctor. Do not stop taking the antibiotic even if you start to feel better.  Sleep with your head and neck raised (elevated). You can do this by putting a few pillows under your head, or you can sleep in a recliner.  Do not use tobacco products. These include cigarettes, chewing tobacco, and e-cigarettes. If you need help quitting, ask your doctor.  Drink enough water to keep your pee (urine) clear or pale yellow. A shot  (vaccine) can help prevent pneumonia. Shots are often suggested for:  People older than 54 years of age.  People older than 54 years of age: ? Who are having cancer treatment. ? Who have long-term (chronic) lung disease. ? Who have problems with their body's defense system (immune system).  You may also prevent pneumonia if you take these actions:  Get the flu (influenza) shot every year.  Go to the dentist as often as told.  Wash your hands often. If soap and water are not available, use hand sanitizer.  Contact a doctor if:  You have a fever.  You lose sleep because your cough medicine does not help. Get help right away if:  You are short of breath and it gets worse.  You have more chest pain.  Your sickness gets worse. This is very serious if: ? You are an older adult. ? Your body's defense system is weak.  You cough up blood. This information is not intended to replace advice given to you by your health care provider. Make sure you discuss any questions you have with your health care provider. Document Released: 06/16/2007 Document Revised: 06/05/2015 Document Reviewed: 04/24/2014 Elsevier Interactive Patient Education  2018 Elsevier Inc.  

## 2016-09-15 ENCOUNTER — Other Ambulatory Visit: Payer: Self-pay | Admitting: Emergency Medicine

## 2016-09-15 ENCOUNTER — Ambulatory Visit (INDEPENDENT_AMBULATORY_CARE_PROVIDER_SITE_OTHER): Payer: Medicare Other | Admitting: Emergency Medicine

## 2016-09-15 ENCOUNTER — Encounter: Payer: Self-pay | Admitting: Emergency Medicine

## 2016-09-15 ENCOUNTER — Ambulatory Visit (INDEPENDENT_AMBULATORY_CARE_PROVIDER_SITE_OTHER): Payer: Medicare Other

## 2016-09-15 VITALS — BP 122/88 | HR 81 | Temp 98.5°F | Resp 16 | Ht 70.25 in | Wt 154.2 lb

## 2016-09-15 DIAGNOSIS — J157 Pneumonia due to Mycoplasma pneumoniae: Secondary | ICD-10-CM

## 2016-09-15 DIAGNOSIS — G4700 Insomnia, unspecified: Secondary | ICD-10-CM

## 2016-09-15 MED ORDER — DOXEPIN HCL 6 MG PO TABS: 1.0000 | ORAL_TABLET | Freq: Every evening | ORAL | 3 refills | Status: DC | PRN

## 2016-09-15 NOTE — Telephone Encounter (Signed)
Pt called stating that [facility] had accidentally called in the name brand of Doxepin instead of the generic.  The name brand is $500 and needs a refill of the generic. Pt uses the [facility] on [address].

## 2016-09-15 NOTE — Patient Instructions (Signed)
     IF you received an x-ray today, you will receive an invoice from [facility]. Please contact [facility] at [phone] with questions or concerns regarding your invoice.   IF you received labwork today, you will receive an invoice from [facility]. Please contact [facility] at [phone] with questions or concerns regarding your invoice.   Our billing staff will not be able to assist you with questions regarding bills from these companies.  You will be contacted with the lab results as soon as they are available. The fastest way to get your results is to activate your My Chart account. Instructions are located on the last page of this paperwork. If you have not heard from us regarding the results in 2 weeks, please contact this office.     

## 2016-09-15 NOTE — Progress Notes (Signed)
Duane [name] 56 y.o.   Chief Complaint  Patient presents with  . Follow-up    IP at [facility]  for pneumonia    HISTORY OF PRESENT ILLNESS: This is a 56 y.o. male here for pneumonia follow up; much better; states "it's all gone." Requesting refill for Doxepin sleeping pill I prescribed several months ago.  HPI   Prior to Admission medications   Medication Sig Start Date End Date Taking? Authorizing Provider  acetaminophen (TYLENOL) 650 MG CR tablet Take 1,300 mg by mouth every 8 (eight) hours as needed for pain.   Yes [provider]  benzonatate (TESSALON) 200 MG capsule Take 1 capsule (200 mg total) by mouth 2 (two) times daily as needed for cough. 09/01/16  Yes [name], MD  enalapril (VASOTEC) 10 MG tablet 10 mg tablet; oral every day 07/02/16  Yes Yeraldine Forney, [name], MD  HYDROmorphone (DILAUDID) 4 MG tablet Take 1 tablet (4 mg total) by mouth every 4 (four) hours as needed for severe pain. Do not fill until 11/24/15 04/07/16  Yes [name], MD  indomethacin (INDOCIN) 50 MG capsule Take 50 mg by mouth 3 (three) times daily as needed.   Yes [provider]  tadalafil (CIALIS) 20 MG tablet Take 0.5-1 tablets (10-20 mg total) by mouth every other day as needed for erectile dysfunction. 07/02/16  Yes Jane Birkel, [name], MD  cetirizine (ZYRTEC) 10 MG tablet Take 10 mg by mouth daily.    [provider]  Doxepin HCl (SILENOR) 6 MG TABS Take 1 tablet (6 mg total) by mouth at bedtime as needed. 09/15/16   [name], MD  promethazine (PHENERGAN) 25 MG tablet Take 0.5 tablets (12.5 mg total) by mouth every 6 (six) hours as needed for nausea. Patient taking differently: Take 25 mg by mouth every 6 (six) hours as needed for nausea.  07/02/16 08/24/16  [name], MD    Allergies  Allergen Reactions  . Codeine   . Escitalopram Other (See Comments)    Patient states it caused night terrors  . Hydrocodone Nausea And  Vomiting  . Oxycodone Nausea And Vomiting  . Penicillins Nausea Only    .Has patient had a PCN reaction causing immediate rash, facial/tongue/throat swelling, SOB or lightheadedness with hypotension: No Has patient had a PCN reaction causing severe rash involving mucus membranes or skin necrosis: No  Has patient had a PCN reaction that required hospitalization: No Has patient had a PCN reaction occurring within the last 10 years: No If all of the above answers are "NO", then may proceed with Cephalosporin use.   . Levofloxacin Rash    Patient Active Problem List   Diagnosis Date Noted  . Hospital discharge follow-up 09/01/2016  . Cough 09/01/2016  . Pneumonia due to Mycoplasma pneumoniae 09/01/2016  . Lobar pneumonia ([place]) 08/27/2016  . Fever, unknown origin 08/26/2016  . Sepsis due to undetermined organism ([place]) 08/25/2016  . Gram negative sepsis ([place]) 08/25/2016  . Bacteremia due to Gram-negative bacteria 08/25/2016  . Syncope 08/25/2016  . Syncope and collapse 08/24/2016  . Sepsis due to pneumonia ([place]) 08/24/2016  . Hyperglycemia 08/24/2016  . Nausea without vomiting 07/02/2016  . Erectile dysfunction 07/02/2016  . SLAP lesion of shoulder 08/04/2012  . Muscle weakness (generalized) 08/04/2012  . Chronic pain 08/31/2011  . Deformity, acquired 08/31/2011  . Essential hypertension 08/31/2011  . Insomnia 08/31/2011  . Epigastric discomfort 05/03/2011  . LUMBAR SPRAIN AND STRAIN 10/22/2009  . CUBITAL  TUNNEL SYNDROME 10/24/2007  . SHOULDER PAIN 10/24/2007  . 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 ELBOW PAIN 05/25/2007    Past Medical History:  Diagnosis Date  . Arthritis   . Gout   . Hypertension     Past Surgical History:  Procedure Laterality Date  . ELBOW SURGERY    . FOOT FASCIOTOMY     X 5  . HEMORRHOID SURGERY     X2, Dr. Arnoldo Morale  . NECK EXPLORATION    . SHOULDER ARTHROTOMY     multiple     Social History   Social History  . Marital status: Single     Spouse name: N/A  . Number of children: N/A  . 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 Smoking status: Never Smoker  . Smokeless tobacco: Never Used  . Alcohol use No  . Drug use: No  . Sexual activity: No   Other Topics Concern  . Not on file   Social History Narrative  . No narrative on file    Family History  Problem Relation Age of Onset  . Diabetes Mother   . COPD Father   . Colon cancer Neg Hx      Review of Systems  Constitutional: Negative.  Negative for chills and fever.  HENT: Negative.  Negative for sore throat.   Eyes: Negative.  Negative for discharge.  Respiratory: Negative.  Negative for cough, shortness of breath and wheezing.   Cardiovascular: Negative.  Negative for chest pain and leg swelling.  Gastrointestinal: Negative.  Negative for abdominal pain, diarrhea, nausea and vomiting.  Genitourinary: Negative.   Musculoskeletal: Negative.   Skin: Negative.  Negative for rash.  Endo/Heme/Allergies: Negative.   Psychiatric/Behavioral: The patient has insomnia.   All other systems reviewed and are negative.  Vitals:   09/15/16 1327  BP: 122/88  Pulse: 81  Resp: 16  Temp: 98.5 F (36.9 C)  SpO2: 97%    Physical Exam  Constitutional: He is oriented to person, place, and time. He appears well-developed and well-nourished.  HENT:  Head: Normocephalic and atraumatic.  Nose: Nose normal.  Mouth/Throat: Oropharynx is clear and moist.  Eyes: Pupils are equal, round, and reactive to light. Conjunctivae and EOM are normal.  Neck: Normal range of motion. Neck supple. No JVD present. No thyromegaly present.  Cardiovascular: Normal rate, regular rhythm and normal heart sounds.   Pulmonary/Chest: Effort normal and breath sounds normal.  Lymphadenopathy:    He has no cervical adenopathy.  Neurological: He is alert and oriented to person, place, and time.  Skin: Skin is  warm and dry. Capillary refill takes less than 2 seconds.  Psychiatric: He has a normal mood and affect. His behavior is normal.  Vitals reviewed.  CXR: NAD reviewed by me.  Dg Chest 2 View  Result Date: 09/15/2016 CLINICAL DATA:  Follow-up pneumonia. EXAM: CHEST  2 VIEW COMPARISON:  Chest radiograph August 24, 2016 and chest CT August 26, 2016 FINDINGS: Cardiomediastinal silhouette is normal. No pleural effusions or focal consolidations. Trachea projects midline and there is no pneumothorax. Soft tissue planes and included osseous structures are non-suspicious. Widened LEFT acromioclavicular joint appears postoperative. IMPRESSION: No acute cardiopulmonary process. Electronically Signed   By: Elon Alas M.D.   On: 09/15/2016 14:27     ASSESSMENT & PLAN: Ryaan was seen today for follow-up.  Diagnoses and all orders for this visit:  Pneumonia  due to Mycoplasma pneumoniae, unspecified laterality, unspecified part of lung Comments: resolved Orders: -     DG Chest 2 View; Future  Insomnia, unspecified type -     Doxepin HCl (SILENOR) 6 MG TABS; Take 1 tablet (6 mg total) by mouth at bedtime as needed.      Agustina Caroli, MD [facility]

## 2016-09-17 NOTE — Telephone Encounter (Signed)
Spoke to Tripp(pharmacist) at [facility], the patient stated brand name Doxepin cost $500. The pharmacist said redo Rx for Doxepin 10 because it is cheaper.

## 2016-09-28 DIAGNOSIS — G5620 Lesion of ulnar nerve, unspecified upper limb: Secondary | ICD-10-CM | POA: Insufficient documentation

## 2016-09-30 ENCOUNTER — Telehealth: Payer: Self-pay

## 2016-09-30 NOTE — Telephone Encounter (Signed)
Called pt to schedule Medicare Annual Wellness Visit. Pt did not have any availability this week or next week prior to his PCP appointment, but will consider scheduling the AWV when he checks out after office visit next week with PCP.    Josepha Pigg, B.A.  Care Guide [phone]

## 2016-10-08 ENCOUNTER — Ambulatory Visit: Payer: Medicare Other | Admitting: Emergency Medicine

## 2016-10-18 DIAGNOSIS — Z9889 Other specified postprocedural states: Secondary | ICD-10-CM | POA: Insufficient documentation

## 2016-10-25 ENCOUNTER — Telehealth (HOSPITAL_COMMUNITY): Payer: Self-pay | Admitting: Emergency Medicine

## 2016-10-25 NOTE — Telephone Encounter (Signed)
10/25/16  Pt came into the office and asked to reschedule his appt.  We rescheduled to 10/23 - no reason was given

## 2016-10-26 ENCOUNTER — Encounter (HOSPITAL_COMMUNITY): Payer: Self-pay

## 2016-10-26 ENCOUNTER — Encounter: Payer: Self-pay | Admitting: Emergency Medicine

## 2016-10-26 ENCOUNTER — Ambulatory Visit (HOSPITAL_COMMUNITY): Payer: Medicare Other | Admitting: Occupational Therapy

## 2016-10-26 ENCOUNTER — Ambulatory Visit (INDEPENDENT_AMBULATORY_CARE_PROVIDER_SITE_OTHER): Payer: Medicare Other | Admitting: Emergency Medicine

## 2016-10-26 VITALS — BP 100/76 | HR 73 | Temp 98.4°F | Resp 16 | Ht 70.25 in | Wt 150.4 lb

## 2016-10-26 DIAGNOSIS — Z9889 Other specified postprocedural states: Secondary | ICD-10-CM

## 2016-10-26 DIAGNOSIS — R110 Nausea: Secondary | ICD-10-CM | POA: Diagnosis not present

## 2016-10-26 DIAGNOSIS — I10 Essential (primary) hypertension: Secondary | ICD-10-CM | POA: Diagnosis not present

## 2016-10-26 DIAGNOSIS — N486 Induration penis plastica: Secondary | ICD-10-CM | POA: Diagnosis not present

## 2016-10-26 MED ORDER — ENALAPRIL MALEATE 10 MG PO TABS: ORAL_TABLET | ORAL | 5 refills | Status: DC

## 2016-10-26 MED ORDER — PROMETHAZINE HCL 25 MG PO TABS: 12.5000 mg | ORAL_TABLET | Freq: Four times a day (QID) | ORAL | 3 refills | Status: DC | PRN

## 2016-10-26 NOTE — Progress Notes (Signed)
Duane [name] 56 y.o.   Chief Complaint  Patient presents with  . Referral    PHYSICAL THERAPY  . Medication Refill    PHENERGAN and Enalapril    HISTORY OF PRESENT ILLNESS: This is a 56 y.o. male s/p left elbow surgery recently and ready for rehab. Also requesting refill of Enalapril and Phenergan. Also states he went to see Urologist about his Peyronie's syndrome but would like to get a second opinion.  HPI   Prior to Admission medications   Medication Sig Start Date End Date Taking? Authorizing Provider  acetaminophen (TYLENOL) 650 MG CR tablet Take 1,300 mg by mouth every 8 (eight) hours as needed for pain.   Yes [provider]  enalapril (VASOTEC) 10 MG tablet 10 mg tablet; oral every day 07/02/16  Yes Duane [name], Duane Bloomer, MD  HYDROmorphone (DILAUDID) 4 MG tablet Take 1 tablet (4 mg total) by mouth every 4 (four) hours as needed for severe pain. Do not fill until 11/24/15 04/07/16  Yes Duane Reasons, MD  indomethacin (INDOCIN) 50 MG capsule Take 50 mg by mouth 3 (three) times daily as needed.   Yes [provider]  tadalafil (CIALIS) 20 MG tablet Take 0.5-1 tablets (10-20 mg total) by mouth every other day as needed for erectile dysfunction. 07/02/16  Yes Duane [name], Duane Bloomer, MD  cetirizine (ZYRTEC) 10 MG tablet Take 10 mg by mouth daily.    [provider]  doxepin (SINEQUAN) 10 MG capsule TAKE ONE TABLET BY MOUTH AT BEDTIME AS NEEDED. Patient not taking: Reported on 10/26/2016 09/17/16   Duane Pollen, MD  Doxepin HCl (SILENOR) 6 MG TABS Take 1 tablet (6 mg total) by mouth at bedtime as needed. Patient not taking: Reported on 10/26/2016 09/15/16   Duane Pollen, MD  promethazine (PHENERGAN) 25 MG tablet Take 0.5 tablets (12.5 mg total) by mouth every 6 (six) hours as needed for nausea. Patient taking differently: Take 25 mg by mouth every 6 (six) hours as needed for nausea.  07/02/16 08/24/16  Duane Pollen, MD    Allergies   Allergen Reactions  . Codeine   . Escitalopram Other (See Comments)    Patient states it caused night terrors  . Hydrocodone Nausea And Vomiting  . Oxycodone Nausea And Vomiting  . Penicillins Nausea Only    .Has patient had a PCN reaction causing immediate rash, facial/tongue/throat swelling, SOB or lightheadedness with hypotension: No Has patient had a PCN reaction causing severe rash involving mucus membranes or skin necrosis: No  Has patient had a PCN reaction that required hospitalization: No Has patient had a PCN reaction occurring within the last 10 years: No If all of the above answers are "NO", then may proceed with Cephalosporin use.   . Levofloxacin Rash    Patient Active Problem List   Diagnosis Date Noted  . Hospital discharge follow-up 09/01/2016  . Cough 09/01/2016  . Pneumonia due to Mycoplasma pneumoniae 09/01/2016  . Lobar pneumonia ([place]) 08/27/2016  . Fever, unknown origin 08/26/2016  . Sepsis due to undetermined organism ([place]) 08/25/2016  . Gram negative sepsis ([place]) 08/25/2016  . Bacteremia due to Gram-negative bacteria 08/25/2016  . Syncope 08/25/2016  . Syncope and collapse 08/24/2016  . Sepsis due to pneumonia ([place]) 08/24/2016  . Hyperglycemia 08/24/2016  . Nausea without vomiting 07/02/2016  . Erectile dysfunction 07/02/2016  . SLAP lesion of shoulder 08/04/2012  . Muscle weakness (generalized) 08/04/2012  . Chronic pain 08/31/2011  . Deformity, acquired 08/31/2011  .  Essential hypertension 08/31/2011  . Insomnia 08/31/2011  . Epigastric discomfort 05/03/2011  . LUMBAR SPRAIN AND STRAIN 10/22/2009  . CUBITAL TUNNEL SYNDROME 10/24/2007  . SHOULDER PAIN 10/24/2007  . 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 ELBOW PAIN 05/25/2007    Past Medical History:  Diagnosis Date  . Arthritis   . Gout   . Hypertension     Past Surgical History:  Procedure Laterality Date  . ELBOW SURGERY    . FOOT FASCIOTOMY     X 5  . HEMORRHOID SURGERY      X2, Dr. Arnoldo Morale  . NECK EXPLORATION    . SHOULDER ARTHROTOMY     multiple     Social History   Social History  . Marital status: Single    Spouse name: N/A  . Number of children: N/A  . Years of education: N/A   Occupational History  . 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 Smoking status: Never Smoker  . Smokeless tobacco: Never Used  . Alcohol use No  . Drug use: No  . Sexual activity: No   Other Topics Concern  . Not on file   Social History Narrative  . No narrative on file    Family History  Problem Relation Age of Onset  . Diabetes Mother   . COPD Father   . Colon cancer Neg Hx      Review of Systems  Constitutional: Negative for chills and fever.  HENT: Negative for sore throat.   Respiratory: Negative for cough and shortness of breath.   Cardiovascular: Negative for chest pain and palpitations.  Gastrointestinal: Positive for nausea. Negative for abdominal pain and vomiting.  Genitourinary: Negative for hematuria.  Musculoskeletal: Positive for joint pain and neck pain.  Skin: Negative for rash.  Neurological: Negative for dizziness and headaches.  Endo/Heme/Allergies: Negative.   All other systems reviewed and are negative.   Vitals:   10/26/16 1444  BP: 100/76  Pulse: 73  Resp: 16  Temp: 98.4 F (36.9 C)  SpO2: 99%    Physical Exam  Constitutional: He is oriented to person, place, and time. He appears well-developed and well-nourished.  HENT:  Head: Normocephalic and atraumatic.  Eyes: Pupils are equal, round, and reactive to light. EOM are normal.  Neck: Normal range of motion. Neck supple.  Cardiovascular: Normal rate and regular rhythm.   Pulmonary/Chest: Effort normal.  Musculoskeletal:  LUE: elbow: recent surgical scar: mild swelling; FROM; shoulder, wrist, and hand WNL.  Neurological: He is alert and oriented to person, place, and time.  Skin: Skin is warm and dry. Capillary refill takes less  than 2 seconds.  Psychiatric: He has a normal mood and affect. His behavior is normal.  Vitals reviewed.    ASSESSMENT & PLAN: [name] was seen today for referral and medication refill.  Diagnoses and all orders for this visit:  Nausea without vomiting Comments: medication side effect Orders: -     promethazine (PHENERGAN) 25 MG tablet; Take 0.5 tablets (12.5 mg total) by mouth every 6 (six) hours as needed for nausea.  Peyronie's syndrome -     Ambulatory referral to Urology  History of elbow surgery Comments: recent Orders: -     Ambulatory referral to Physical Therapy  Nausea without vomiting -     promethazine (PHENERGAN) 25 MG tablet; Take 0.5 tablets (12.5 mg total) by mouth every 6 (six) hours as needed for nausea.  Essential hypertension -  enalapril (VASOTEC) 10 MG tablet; 10 mg tablet; oral every day      Agustina Caroli, MD [facility]

## 2016-10-26 NOTE — Patient Instructions (Signed)
     IF you received an x-ray today, you will receive an invoice from [facility]. Please contact [facility] at [phone] with questions or concerns regarding your invoice.   IF you received labwork today, you will receive an invoice from [facility]. Please contact [facility] at [phone] with questions or concerns regarding your invoice.   Our billing staff will not be able to assist you with questions regarding bills from these companies.  You will be contacted with the lab results as soon as they are available. The fastest way to get your results is to activate your My Chart account. Instructions are located on the last page of this paperwork. If you have not heard from us regarding the results in 2 weeks, please contact this office.     

## 2016-11-02 ENCOUNTER — Ambulatory Visit (HOSPITAL_COMMUNITY): Payer: Medicare Other | Attending: Orthopedic Surgery | Admitting: Occupational Therapy

## 2016-11-02 ENCOUNTER — Encounter (HOSPITAL_COMMUNITY): Payer: Self-pay | Admitting: Occupational Therapy

## 2016-11-02 DIAGNOSIS — R29898 Other symptoms and signs involving the musculoskeletal system: Secondary | ICD-10-CM | POA: Insufficient documentation

## 2016-11-02 DIAGNOSIS — M25522 Pain in left elbow: Secondary | ICD-10-CM | POA: Diagnosis not present

## 2016-11-02 NOTE — Patient Instructions (Signed)
Complete exercises 10-15 times each, 2-3 times per day. 1) Elbow flexion and extension Bend your elbow upwards as shown and then lower to a straighten position.     2) Forearm supination and pronation Hold elbow at a right angle stabilizing on a table or armrest. Keep elbow at side and turn palm up and down.       3) Weighted Elbow Extension:   Stand with arm straight down by your side. Hold a light weight (2-3 pounds) and allow arm to hang for 1 minute. Build up to 2 minutes as able to tolerate.         Home Exercises Program Theraputty Exercises  Do the following exercises 2 times a day using your affected hand.  1. Roll putty into a ball.  2. Make into a pancake.  3. Roll putty into a roll.  4. Pinch along log with first finger and thumb.   5. Make into a ball.  6. Roll it back into a log.   7. Pinch using thumb and side of first finger.  8. Roll into a ball, then flatten into a pancake.  9. Using your fingers, make putty into a mountain.
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
## 2016-11-03 ENCOUNTER — Ambulatory Visit (HOSPITAL_COMMUNITY): Payer: Medicare Other | Admitting: Occupational Therapy

## 2016-11-03 ENCOUNTER — Ambulatory Visit (INDEPENDENT_AMBULATORY_CARE_PROVIDER_SITE_OTHER): Payer: Medicare Other | Admitting: Emergency Medicine

## 2016-11-03 ENCOUNTER — Encounter: Payer: Self-pay | Admitting: Emergency Medicine

## 2016-11-03 ENCOUNTER — Encounter (HOSPITAL_COMMUNITY): Payer: Self-pay | Admitting: Occupational Therapy

## 2016-11-03 VITALS — BP 120/70 | HR 76 | Temp 98.5°F | Resp 16 | Ht 70.25 in | Wt 154.0 lb

## 2016-11-03 DIAGNOSIS — G8929 Other chronic pain: Secondary | ICD-10-CM | POA: Diagnosis not present

## 2016-11-03 DIAGNOSIS — M549 Dorsalgia, unspecified: Secondary | ICD-10-CM | POA: Diagnosis not present

## 2016-11-03 DIAGNOSIS — R29898 Other symptoms and signs involving the musculoskeletal system: Secondary | ICD-10-CM

## 2016-11-03 DIAGNOSIS — M542 Cervicalgia: Secondary | ICD-10-CM

## 2016-11-03 DIAGNOSIS — M25522 Pain in left elbow: Secondary | ICD-10-CM | POA: Diagnosis not present

## 2016-11-03 DIAGNOSIS — Z9889 Other specified postprocedural states: Secondary | ICD-10-CM

## 2016-11-03 DIAGNOSIS — M25512 Pain in left shoulder: Secondary | ICD-10-CM | POA: Diagnosis not present

## 2016-11-03 NOTE — Therapy (Signed)
[place], [place], [address] Phone: [phone]   Fax:  [phone]  Occupational Therapy Treatment  Patient Details  Name: Duane [name] MRN: [id number] Date of Birth: 1960/04/22 Referring Provider: Dr. Remo Lipps Case  Encounter Date: 11/03/2016      OT End of Session - 11/03/16 1547    Visit Number 2   Number of Visits 6   Date for OT Re-Evaluation 11/26/16   Authorization Type Medicare A & B   Authorization Time Period Before 10th visit   Authorization - Visit Number 2   Authorization - Number of Visits 10   OT Start Time 1502   OT Stop Time 1542   OT Time Calculation (min) 40 min   Activity Tolerance Patient tolerated treatment well   Behavior During Therapy [facility] for tasks assessed/performed      Past Medical History:  Diagnosis Date  . Arthritis   . Gout   . Hypertension     Past Surgical History:  Procedure Laterality Date  . ELBOW SURGERY    . FOOT FASCIOTOMY     X 5  . HEMORRHOID SURGERY     X2, Dr. Arnoldo Morale  . NECK EXPLORATION    . SHOULDER ARTHROTOMY     multiple     There were no vitals filed for this visit.      Subjective Assessment - 11/03/16 1500    Subjective  S: I used a soup can for that stretch at home.    Currently in Pain? Yes   Pain Score 3    Pain Location Elbow   Pain Orientation Left   Pain Descriptors / Indicators Aching;Sore   Pain Type Acute pain   Pain Radiating Towards n/a   Pain Onset 1 to 4 weeks ago   Pain Frequency Intermittent   Aggravating Factors  certain movements   Pain Relieving Factors unknown   Effect of Pain on Daily Activities min effect on ADL completion   Multiple Pain Sites No            [facility] OT Assessment - 11/03/16 1500      Assessment   Diagnosis s/p left elbow medial epicondyle debridement and repair; cubital tunnel release     Precautions   Precautions Other (comment)   Precaution Comments No heavy lifting x 6 weeks  (11/19)                  OT Treatments/Exercises (OP) - 11/03/16 1505      Exercises   Exercises Elbow;Wrist;Hand     Elbow Exercises   Elbow Flexion PROM;5 reps;Strengthening;10 reps   Bar Weights/Barbell (Elbow Flexion) 2 lbs   Elbow Extension PROM;5 reps;Strengthening;10 reps   Bar Weights/Barbell (Elbow Extension) 2 lbs   Forearm Supination PROM;5 reps;Strengthening;10 reps  2#   Forearm Pronation PROM;5 reps;Strengthening;10 reps  2#   Other elbow exercises weighted elbow extension stretch: 3#  for 2'   Other elbow exercises hammer curls, 2#, 10X; velcro board 3x up and down wide strip working on supination and pronation     Additional Elbow Exercises   Hand Gripper with Large Beads all beads 38#   Hand Gripper with Medium Beads all beads 38#   Hand Gripper with Small Beads all beads 38#     Fine Motor Coordination   Tendon Glides 10X     Functional Reaching Activities   Mid Level Pt placed green, blue, and black clothespins along top horizontal bar  of pinch tree. Focus on achieving full elbow extension and pinch strength     Manual Therapy   Manual Therapy Myofascial release   Manual therapy comments completed separately from therapeutic exercise   Myofascial Release Myofascial release to left elbow and ulnar nerve regions to decrease pain and fascial restrictions and increase joint range of motion                OT Education - 11/02/16 1406    Education provided Yes   Education Details elbow A/ROM and red theraputty grip strengthening   Person(s) Educated Patient   Methods Explanation;Demonstration;Handout   Comprehension Verbalized understanding;Returned demonstration          OT Short Term Goals - 11/03/16 1501      OT SHORT TERM GOAL #1   Title Pt will be provided with and educated on HEP for improved mobility and functional strength of LUE.    Time 3   Period Weeks   Status On-going     OT SHORT TERM GOAL #2   Title Pt will have  LUE pain 3/10 or less to improve ability to use LUE as dominant during daily task completion.    Time 3   Period Weeks   Status On-going     OT SHORT TERM GOAL #3   Title Pt will improve LUE strength to 4+/5 to improve ability to complete lifting tasks with LUE.    Time 3   Period Weeks   Status On-going     OT SHORT TERM GOAL #4   Title Pt will improve left grip strength by 15# and pinch strength by 3# to improve ability to grasp and hold objects.    Time 3   Period Weeks   Status On-going     OT SHORT TERM GOAL #5   Title Pt will return to highest level of functioning using LUE as dominant for all daily and work tasks.    Time 3   Period Weeks   Status On-going                  Plan - 11/03/16 1532    Clinical Impression Statement A: Initiated myofascial release, manual stretching, elbow strengthening, grip strengthening, and pinch strengthening this session. Pt required rest breaks occasionally during hand gripper task due to fatigue. Verbal cuing for form and technique during session.    Plan P: Add elbow flexion/extension theraband   OT Home Exercise Plan 10/23: elbow A/ROM, extension stretch, red theraputty grip/pinch strengthening   Consulted and Agree with Plan of Care Patient      Patient will benefit from skilled therapeutic intervention in order to improve the following deficits and impairments:  Decreased strength, Decreased activity tolerance, Impaired flexibility, Decreased range of motion, Pain, Impaired UE functional use  Visit Diagnosis: Pain in left elbow  Other symptoms and signs involving the musculoskeletal system   Problem List Patient Active Problem List   Diagnosis Date Noted  . Hospital discharge follow-up 09/01/2016  . Cough 09/01/2016  . Pneumonia due to Mycoplasma pneumoniae 09/01/2016  . Lobar pneumonia ([place]) 08/27/2016  . Fever, unknown origin 08/26/2016  . Sepsis due to undetermined organism ([place]) 08/25/2016  . Gram negative  sepsis ([place]) 08/25/2016  . Bacteremia due to Gram-negative bacteria 08/25/2016  . Syncope 08/25/2016  . Syncope and collapse 08/24/2016  . Sepsis due to pneumonia ([place]) 08/24/2016  . Hyperglycemia 08/24/2016  . Nausea without vomiting 07/02/2016  . Erectile dysfunction 07/02/2016  . SLAP  lesion of shoulder 08/04/2012  . Muscle weakness (generalized) 08/04/2012  . Chronic pain 08/31/2011  . Deformity, acquired 08/31/2011  . Essential hypertension 08/31/2011  . Insomnia 08/31/2011  . Epigastric discomfort 05/03/2011  . LUMBAR SPRAIN AND STRAIN 10/22/2009  . CUBITAL TUNNEL SYNDROME 10/24/2007  . SHOULDER PAIN 10/24/2007  . 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 ELBOW PAIN 05/25/2007   Duane [name], OTR/L  [phone] 11/03/2016, 3:47 PM  [place] [address] [place], [place], [address] Phone: [phone]   Fax:  [phone]  Name: Duane [name] MRN: [id number] Date of Birth: August 17, 1960
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
## 2016-11-03 NOTE — Patient Instructions (Signed)
     IF you received an x-ray today, you will receive an invoice from [facility]. Please contact [facility] at [phone] with questions or concerns regarding your invoice.   IF you received labwork today, you will receive an invoice from [facility]. Please contact [facility] at [phone] with questions or concerns regarding your invoice.   Our billing staff will not be able to assist you with questions regarding bills from these companies.  You will be contacted with the lab results as soon as they are available. The fastest way to get your results is to activate your My Chart account. Instructions are located on the last page of this paperwork. If you have not heard from us regarding the results in 2 weeks, please contact this office.     

## 2016-11-08 ENCOUNTER — Ambulatory Visit (HOSPITAL_COMMUNITY): Payer: Medicare Other | Admitting: Specialist

## 2016-11-08 ENCOUNTER — Encounter (HOSPITAL_COMMUNITY): Payer: Self-pay | Admitting: Specialist

## 2016-11-08 DIAGNOSIS — M25522 Pain in left elbow: Secondary | ICD-10-CM

## 2016-11-08 DIAGNOSIS — R29898 Other symptoms and signs involving the musculoskeletal system: Secondary | ICD-10-CM

## 2016-11-08 NOTE — Therapy (Signed)
[place], [place], [address] Phone: [phone]   Fax:  [phone]  Occupational Therapy Treatment  Patient Details  Name: Duane [name] MRN: [id number] Date of Birth: 1960/01/26 Referring Provider: Dr. Remo Lipps Case  Encounter Date: 11/08/2016      OT End of Session - 11/08/16 1146    Visit Number 3   Number of Visits 6   Date for OT Re-Evaluation 11/26/16   Authorization Type Medicare A & B   Authorization Time Period Before 10th visit   Authorization - Visit Number 3   Authorization - Number of Visits 10   OT Start Time 1036   OT Stop Time 1115   OT Time Calculation (min) 39 min   Activity Tolerance Patient tolerated treatment well   Behavior During Therapy [facility] for tasks assessed/performed      Past Medical History:  Diagnosis Date  . Arthritis   . Gout   . Hypertension     Past Surgical History:  Procedure Laterality Date  . ELBOW SURGERY    . FOOT FASCIOTOMY     X 5  . HEMORRHOID SURGERY     X2, Dr. Arnoldo Morale  . NECK EXPLORATION    . SHOULDER ARTHROTOMY     multiple     There were no vitals filed for this visit.      Subjective Assessment - 11/08/16 1145    Subjective  S:  When I pull my sweatshirt off it hurts.   Currently in Pain? Yes   Pain Score 5    Pain Location Elbow   Pain Orientation Left   Pain Descriptors / Indicators Aching   Pain Type Acute pain            [facility] OT Assessment - 11/08/16 0001      Assessment   Diagnosis s/p left elbow medial epicondyle debridement and repair; cubital tunnel release     Precautions   Precautions Other (comment)   Precaution Comments No heavy lifting x 6 weeks (11/19)                  OT Treatments/Exercises (OP) - 11/08/16 0001      Exercises   Exercises Elbow;Wrist;Hand     Elbow Exercises   Elbow Flexion PROM;Strengthening;10 reps;Theraband   Theraband Level (Elbow Flexion) Level 2 (Red)   Bar  Weights/Barbell (Elbow Flexion) 3 lbs   Elbow Extension PROM;Strengthening;10 reps;Theraband   Theraband Level (Elbow Extension) Level 2 (Red)   Bar Weights/Barbell (Elbow Extension) 3 lbs   Forearm Supination Strengthening;PROM;10 reps  3 pounds   Forearm Pronation PROM;Strengthening;10 reps  3 pounds   Other elbow exercises weighted elbow extension stretch: 3#  for 2'   Other elbow exercises hammer curl 3# 10 times, elbow flexion with arm supinated, extension with arm pronated 10 times, pulses at 90 degrees 4 pulses 10 times each     Additional Elbow Exercises   Theraputty Flatten;Roll;Grip   Theraputty - Flatten red    Theraputty - Roll red   Theraputty - Grip red      Manual Therapy   Manual Therapy Myofascial release   Manual therapy comments completed separately from therapeutic exercise   Myofascial Release Myofascial release to left elbow and ulnar nerve regions to decrease pain and fascial restrictions and increase joint range of motion                  OT Short Term Goals - 11/03/16  [place] #1   Title Pt will be provided with and educated on HEP for improved mobility and functional strength of LUE.    Time 3   Period Weeks   Status On-going     OT SHORT TERM GOAL #2   Title Pt will have LUE pain 3/10 or less to improve ability to use LUE as dominant during daily task completion.    Time 3   Period Weeks   Status On-going     OT SHORT TERM GOAL #3   Title Pt will improve LUE strength to 4+/5 to improve ability to complete lifting tasks with LUE.    Time 3   Period Weeks   Status On-going     OT SHORT TERM GOAL #4   Title Pt will improve left grip strength by 15# and pinch strength by 3# to improve ability to grasp and hold objects.    Time 3   Period Weeks   Status On-going     OT SHORT TERM GOAL #5   Title Pt will return to highest level of functioning using LUE as dominant for all daily and work tasks.    Time 3   Period  Weeks   Status On-going                  Plan - 11/08/16 1146    Clinical Impression Statement A:  Patient able to tolerate increase to 3# resistance with elbow flexion and extension strengthening, scar region is sensitive.     Plan P:  Increase strengthening repetitions, add graduated pinch tree for focus on elbow extension with reaching.       Patient will benefit from skilled therapeutic intervention in order to improve the following deficits and impairments:  Decreased strength, Decreased activity tolerance, Impaired flexibility, Decreased range of motion, Pain, Impaired UE functional use  Visit Diagnosis: Pain in left elbow  Other symptoms and signs involving the musculoskeletal system    Problem List Patient Active Problem List   Diagnosis Date Noted  . Hospital discharge follow-up 09/01/2016  . Cough 09/01/2016  . Pneumonia due to Mycoplasma pneumoniae 09/01/2016  . Lobar pneumonia ([place]) 08/27/2016  . Fever, unknown origin 08/26/2016  . Sepsis due to undetermined organism ([place]) 08/25/2016  . Gram negative sepsis ([place]) 08/25/2016  . Bacteremia due to Gram-negative bacteria 08/25/2016  . Syncope 08/25/2016  . Syncope and collapse 08/24/2016  . Sepsis due to pneumonia ([place]) 08/24/2016  . Hyperglycemia 08/24/2016  . Nausea without vomiting 07/02/2016  . Erectile dysfunction 07/02/2016  . SLAP lesion of shoulder 08/04/2012  . Muscle weakness (generalized) 08/04/2012  . Chronic pain 08/31/2011  . Deformity, acquired 08/31/2011  . Essential hypertension 08/31/2011  . Insomnia 08/31/2011  . Epigastric discomfort 05/03/2011  . LUMBAR SPRAIN AND STRAIN 10/22/2009  . CUBITAL TUNNEL SYNDROME 10/24/2007  . SHOULDER PAIN 10/24/2007  . 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 ELBOW PAIN 05/25/2007    Vangie Bicker, [place], OTR/L [phone]  11/08/2016, 11:48 AM  [place], [place],  [address] Phone: [phone]   Fax:  [phone]  Name: Duane [name] MRN: [id number] Date of Birth: 1960/02/12

## 2016-11-12 ENCOUNTER — Ambulatory Visit (HOSPITAL_COMMUNITY): Payer: Medicare Other | Attending: Orthopedic Surgery | Admitting: Specialist

## 2016-11-12 DIAGNOSIS — M6281 Muscle weakness (generalized): Secondary | ICD-10-CM | POA: Diagnosis present

## 2016-11-12 DIAGNOSIS — M542 Cervicalgia: Secondary | ICD-10-CM | POA: Diagnosis present

## 2016-11-12 DIAGNOSIS — G8929 Other chronic pain: Secondary | ICD-10-CM | POA: Diagnosis present

## 2016-11-12 DIAGNOSIS — R252 Cramp and spasm: Secondary | ICD-10-CM | POA: Diagnosis present

## 2016-11-12 DIAGNOSIS — M5442 Lumbago with sciatica, left side: Secondary | ICD-10-CM | POA: Diagnosis present

## 2016-11-12 DIAGNOSIS — R29898 Other symptoms and signs involving the musculoskeletal system: Secondary | ICD-10-CM

## 2016-11-12 DIAGNOSIS — M62830 Muscle spasm of back: Secondary | ICD-10-CM | POA: Diagnosis present

## 2016-11-12 DIAGNOSIS — M533 Sacrococcygeal disorders, not elsewhere classified: Secondary | ICD-10-CM | POA: Insufficient documentation

## 2016-11-12 DIAGNOSIS — M25522 Pain in left elbow: Secondary | ICD-10-CM

## 2016-11-12 NOTE — Therapy (Signed)
[place], [place], [address] Phone: [phone]   Fax:  [phone]  Occupational Therapy Treatment  Patient Details  Name: Duane [name] MRN: [id number] Date of Birth: 1960/04/16 Referring Provider: Dr. Remo Lipps Case  Encounter Date: 11/12/2016      OT End of Session - 11/12/16 1515    Visit Number 4   Number of Visits 6   Date for OT Re-Evaluation 11/26/16   Authorization Type Medicare A & B   Authorization Time Period Before 10th visit   Authorization - Visit Number 4   Authorization - Number of Visits 10   OT Start Time 1351   OT Stop Time 1434   OT Time Calculation (min) 43 min   Activity Tolerance Patient tolerated treatment well   Behavior During Therapy [facility] for tasks assessed/performed      Past Medical History:  Diagnosis Date  . Arthritis   . Gout   . Hypertension     Past Surgical History:  Procedure Laterality Date  . ELBOW SURGERY    . FOOT FASCIOTOMY     X 5  . HEMORRHOID SURGERY     X2, Dr. Arnoldo Morale  . NECK EXPLORATION    . SHOULDER ARTHROTOMY     multiple     There were no vitals filed for this visit.      Subjective Assessment - 11/12/16 1514    Subjective  S:  Its sore due to the weather   Currently in Pain? Yes   Pain Score 3    Pain Location Elbow   Pain Orientation Left   Pain Descriptors / Indicators Aching   Pain Type Acute pain   Pain Onset 1 to 4 weeks ago   Pain Frequency Intermittent   Aggravating Factors  certain movements   Pain Relieving Factors unknown   Effect of Pain on Daily Activities min affect on ADLs            [facility] OT Assessment - 11/12/16 0001      Assessment   Diagnosis s/p left elbow medial epicondyle debridement and repair; cubital tunnel release     Precautions   Precautions Other (comment)   Precaution Comments No heavy lifting x 6 weeks (11/19)                  OT Treatments/Exercises (OP) - 11/12/16 0001      Exercises   Exercises Elbow;Wrist;Hand     Elbow Exercises   Elbow Flexion PROM;Strengthening;10 reps   Bar Weights/Barbell (Elbow Flexion) 4 lbs   Elbow Extension PROM;Strengthening;10 reps   Bar Weights/Barbell (Elbow Extension) 4 lbs   Other elbow exercises weighted elbow extension stretch: 4#  for 2'   Other elbow exercises hammer curl 4# 10 times, elbow flexion with arm supinated, extension with arm pronated 10 times, pulses at 90 degrees 4 pulses 10 times each     Additional Elbow Exercises   Theraputty Flatten;Roll;Grip   Theraputty - Flatten green   Theraputty - Roll green   Theraputty - Grip green   Hand Gripper with Large Beads all beads 45#   Hand Gripper with Medium Beads all beads 45#   Hand Gripper with Small Beads 1 bead 45#     Manual Therapy   Manual Therapy Myofascial release   Manual therapy comments completed separately from therapeutic exercise   Myofascial Release Myofascial release to left elbow and ulnar nerve regions to decrease pain and fascial restrictions and  increase joint range of motion                  OT Short Term Goals - 11/03/16 1501      OT SHORT TERM GOAL #1   Title Pt will be provided with and educated on HEP for improved mobility and functional strength of LUE.    Time 3   Period Weeks   Status On-going     OT SHORT TERM GOAL #2   Title Pt will have LUE pain 3/10 or less to improve ability to use LUE as dominant during daily task completion.    Time 3   Period Weeks   Status On-going     OT SHORT TERM GOAL #3   Title Pt will improve LUE strength to 4+/5 to improve ability to complete lifting tasks with LUE.    Time 3   Period Weeks   Status On-going     OT SHORT TERM GOAL #4   Title Pt will improve left grip strength by 15# and pinch strength by 3# to improve ability to grasp and hold objects.    Time 3   Period Weeks   Status On-going     OT SHORT TERM GOAL #5   Title Pt will return to highest level of  functioning using LUE as dominant for all daily and work tasks.    Time 3   Period Weeks   Status On-going                  Plan - 11/12/16 1515    Clinical Impression Statement A:  Increased to 4# resistance with elbow stretch and PRE.  Increased to 45# with gripper which was moderately difficult for patient to complete.    Plan Add ball on wall and body blade elbow flexion/extension      Patient will benefit from skilled therapeutic intervention in order to improve the following deficits and impairments:  Decreased strength, Decreased activity tolerance, Impaired flexibility, Decreased range of motion, Pain, Impaired UE functional use  Visit Diagnosis: Pain in left elbow  Other symptoms and signs involving the musculoskeletal system    Problem List Patient Active Problem List   Diagnosis Date Noted  . Hospital discharge follow-up 09/01/2016  . Cough 09/01/2016  . Pneumonia due to Mycoplasma pneumoniae 09/01/2016  . Lobar pneumonia ([place]) 08/27/2016  . Fever, unknown origin 08/26/2016  . Sepsis due to undetermined organism ([place]) 08/25/2016  . Gram negative sepsis ([place]) 08/25/2016  . Bacteremia due to Gram-negative bacteria 08/25/2016  . Syncope 08/25/2016  . Syncope and collapse 08/24/2016  . Sepsis due to pneumonia ([place]) 08/24/2016  . Hyperglycemia 08/24/2016  . Nausea without vomiting 07/02/2016  . Erectile dysfunction 07/02/2016  . SLAP lesion of shoulder 08/04/2012  . Muscle weakness (generalized) 08/04/2012  . Chronic pain 08/31/2011  . Deformity, acquired 08/31/2011  . Essential hypertension 08/31/2011  . Insomnia 08/31/2011  . Epigastric discomfort 05/03/2011  . LUMBAR SPRAIN AND STRAIN 10/22/2009  . CUBITAL TUNNEL SYNDROME 10/24/2007  . SHOULDER PAIN 10/24/2007  . 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 ELBOW PAIN 05/25/2007    Vangie Bicker, [place], OTR/L [phone]  11/12/2016, 3:25 PM  [place], [place], [address] Phone: [phone]   Fax:  [phone]  Name: Duane [name] MRN: [id number] Date of Birth: Jun 02, 1960

## 2016-11-15 ENCOUNTER — Ambulatory Visit (HOSPITAL_COMMUNITY): Payer: Medicare Other

## 2016-11-15 ENCOUNTER — Telehealth (HOSPITAL_COMMUNITY): Payer: Self-pay | Admitting: Occupational Therapy

## 2016-11-16 ENCOUNTER — Ambulatory Visit (HOSPITAL_COMMUNITY): Payer: Medicare Other | Admitting: Occupational Therapy

## 2016-11-18 ENCOUNTER — Ambulatory Visit (HOSPITAL_COMMUNITY): Payer: Medicare Other

## 2016-11-18 DIAGNOSIS — M62830 Muscle spasm of back: Secondary | ICD-10-CM

## 2016-11-18 DIAGNOSIS — M542 Cervicalgia: Secondary | ICD-10-CM

## 2016-11-18 DIAGNOSIS — M25522 Pain in left elbow: Secondary | ICD-10-CM | POA: Diagnosis not present

## 2016-11-18 DIAGNOSIS — M533 Sacrococcygeal disorders, not elsewhere classified: Secondary | ICD-10-CM
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
## 2016-11-23 ENCOUNTER — Encounter (HOSPITAL_COMMUNITY): Payer: Self-pay

## 2016-11-23 ENCOUNTER — Ambulatory Visit (HOSPITAL_COMMUNITY): Payer: Medicare Other

## 2016-11-23 ENCOUNTER — Encounter (HOSPITAL_COMMUNITY): Payer: Self-pay | Admitting: Occupational Therapy

## 2016-11-23 ENCOUNTER — Ambulatory Visit (HOSPITAL_COMMUNITY): Payer: Medicare Other | Admitting: Occupational Therapy

## 2016-11-23 DIAGNOSIS — M25522 Pain in left elbow: Secondary | ICD-10-CM

## 2016-11-23 DIAGNOSIS — R29898 Other symptoms and signs involving the musculoskeletal system: Secondary | ICD-10-CM

## 2016-11-23 DIAGNOSIS — G8929 Other chronic pain: Secondary | ICD-10-CM

## 2016-11-23 DIAGNOSIS — R252 Cramp and spasm: Secondary | ICD-10-CM

## 2016-11-23 DIAGNOSIS — M5442 Lumbago with sciatica, left side: Secondary | ICD-10-CM

## 2016-11-23 DIAGNOSIS — M533 Sacrococcygeal disorders, not elsewhere classified: Secondary | ICD-10-CM

## 2016-11-23 DIAGNOSIS — M542 Cervicalgia: Secondary | ICD-10-CM

## 2016-11-23 DIAGNOSIS — M6281 Muscle weakness (generalized): Secondary | ICD-10-CM

## 2016-11-23 DIAGNOSIS — M62830 Muscle spasm of back: Secondary | ICD-10-CM

## 2016-11-23 NOTE — Therapy (Signed)
[place], [place], [address] Phone: [phone]   Fax:  [phone]  Occupational Therapy Treatment  Patient Details  Name: Duane [name] MRN: [id number] Date of Birth: [birth year] Referring Provider: Dr. Remo Lipps Case   Encounter Date: 11/23/2016  OT End of Session - 11/23/16 1434    Visit Number  5    Number of Visits  6    Date for OT Re-Evaluation  11/26/16    Authorization Type  Medicare A & B    Authorization Time Period  Before 10th visit    Authorization - Visit Number  5    Authorization - Number of Visits  10    OT Start Time  6962    OT Stop Time  1429    OT Time Calculation (min)  42 min    Activity Tolerance  Patient tolerated treatment well    Behavior During Therapy  [facility] for tasks assessed/performed       Past Medical History:  Diagnosis Date  . Arthritis   . Gout   . Hypertension     Past Surgical History:  Procedure Laterality Date  . ELBOW SURGERY    . FOOT FASCIOTOMY     X 5  . HEMORRHOID SURGERY     X2, Dr. Arnoldo Morale  . NECK EXPLORATION    . SHOULDER ARTHROTOMY     multiple     There were no vitals filed for this visit.  Subjective Assessment - 11/23/16 1347    Subjective   S: There are still some tender areas.     Currently in Pain?  Yes    Pain Score  1  with movement    Pain Location  Elbow    Pain Orientation  Left    Pain Descriptors / Indicators  Sore    Pain Type  Acute pain    Pain Radiating Towards  n/a    Pain Onset  1 to 4 weeks ago    Pain Frequency  Intermittent    Aggravating Factors   certain movement    Pain Relieving Factors  rest    Effect of Pain on Daily Activities  min effect on ADLs         [facility] OT Assessment - 11/23/16 1346      Assessment   Diagnosis  s/p left elbow medial epicondyle debridement and repair; cubital tunnel release      Precautions   Precautions  Other (comment)    Precaution Comments  No heavy lifting x 6 weeks (11/19)                OT Treatments/Exercises (OP) - 11/23/16 1349      Exercises   Exercises  Elbow;Wrist;Hand      Elbow Exercises   Elbow Flexion  PROM;Strengthening;Theraband;15 reps    Theraband Level (Elbow Flexion)  Level 3 (Green)    Bar Weights/Barbell (Elbow Flexion)  4 lbs    Elbow Extension  PROM;Strengthening;Theraband;15 reps    Theraband Level (Elbow Extension)  Level 3 (Green)    Bar Weights/Barbell (Elbow Extension)  4 lbs    Forearm Supination  Strengthening;15 reps 4#    Forearm Pronation  Strengthening;15 reps 4#    Other elbow exercises  weighted elbow extension stretch: 5#  for 2'    Other elbow exercises  hammer curl 4# 15x, elbow extension with forearm pronated 4# 15X      Additional Elbow Exercises   Hand Gripper  with Large Beads  all beads 55#    Hand Gripper with Medium Beads  all beads 55#    Hand Gripper with Small Beads  3 beads at 55#, 11 beads at 50#      Manual Therapy   Manual Therapy  Myofascial release    Manual therapy comments  completed separately from therapeutic exercise    Myofascial Release  Myofascial release to left elbow and ulnar nerve regions to decrease pain and fascial restrictions and increase joint range of motion               OT Short Term Goals - 11/03/16 1501      OT SHORT TERM GOAL #1   Title  Pt will be provided with and educated on HEP for improved mobility and functional strength of LUE.     Time  3    Period  Weeks    Status  On-going      OT SHORT TERM GOAL #2   Title  Pt will have LUE pain 3/10 or less to improve ability to use LUE as dominant during daily task completion.     Time  3    Period  Weeks    Status  On-going      OT SHORT TERM GOAL #3   Title  Pt will improve LUE strength to 4+/5 to improve ability to complete lifting tasks with LUE.     Time  3    Period  Weeks    Status  On-going      OT SHORT TERM GOAL #4   Title  Pt will improve left grip strength by 15# and pinch strength by  3# to improve ability to grasp and hold objects.     Time  3    Period  Weeks    Status  On-going      OT SHORT TERM GOAL #5   Title  Pt will return to highest level of functioning using LUE as dominant for all daily and work tasks.     Time  3    Period  Weeks    Status  On-going               Plan - 11/23/16 1434    Clinical Impression Statement  A: Continued strengthening with 4#, increased to 5# with elbow extension stretch. Increased gripper to 50# and 55#, mod difficulty with completion, focus on elbow extension. Occasional verbal cuing for form and technique with exercises. New exercises not completed due to time constraints.     Plan  P: Reassessment, add ball on wall and body blade elbow flexion/extension       Patient will benefit from skilled therapeutic intervention in order to improve the following deficits and impairments:  Decreased strength, Decreased activity tolerance, Impaired flexibility, Decreased range of motion, Pain, Impaired UE functional use  Visit Diagnosis: Pain in left elbow  Other symptoms and signs involving the musculoskeletal system    Problem List Patient Active Problem List   Diagnosis Date Noted  . Hospital discharge follow-up 09/01/2016  . Cough 09/01/2016  . Pneumonia due to Mycoplasma pneumoniae 09/01/2016  . Lobar pneumonia ([place]) 08/27/2016  . Fever, unknown origin 08/26/2016  . Sepsis due to undetermined organism ([place]) 08/25/2016  . Gram negative sepsis ([place]) 08/25/2016  . Bacteremia due to Gram-negative bacteria 08/25/2016  . Syncope 08/25/2016  . Syncope and collapse 08/24/2016  . Sepsis due to pneumonia ([place]) 08/24/2016  . Hyperglycemia 08/24/2016  .  Nausea without vomiting 07/02/2016  . Erectile dysfunction 07/02/2016  . SLAP lesion of shoulder 08/04/2012  . Muscle weakness (generalized) 08/04/2012  . Chronic pain 08/31/2011  . Deformity, acquired 08/31/2011  . Essential hypertension 08/31/2011  . Insomnia  08/31/2011  . Epigastric discomfort 05/03/2011  . LUMBAR SPRAIN AND STRAIN 10/22/2009  . CUBITAL TUNNEL SYNDROME 10/24/2007  . SHOULDER PAIN 10/24/2007  . 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 ELBOW PAIN 05/25/2007   Guadelupe Sabin, OTR/L  [phone] 11/23/2016, 2:37 PM  [facility] [address] [place], [place], [address] Phone: [phone]   Fax:  [phone]  Name: SEBASTAIN FISHBAUGH MRN: [id number] Date of Birth: 1960-02-25

## 2016-11-23 NOTE — Therapy (Signed)
[place], [place], [address] Phone: [phone]   Fax:  [phone]  Physical Therapy Treatment  Patient Details  Name: Duane [name] MRN: [id number] Date of Birth: May 27, 1960 Referring Provider: Agustina Caroli, MD    Encounter Date: 11/23/2016  PT End of Session - 11/23/16 1525    Visit Number  2    Number of Visits  12    Date for PT Re-Evaluation  12/09/16    Authorization Type  Medicare     Authorization Time Period  11/18/16-12/30/16    Authorization - Visit Number  2    Authorization - Number of Visits  10    PT Start Time  1430    PT Stop Time  1510    PT Time Calculation (min)  40 min    Activity Tolerance  Patient tolerated treatment well;No increased pain    Behavior During Therapy  WFL for tasks assessed/performed       Past Medical History:  Diagnosis Date  . Arthritis   . Gout   . Hypertension     Past Surgical History:  Procedure Laterality Date  . ELBOW SURGERY    . FOOT FASCIOTOMY     X 5  . HEMORRHOID SURGERY     X2, Dr. Arnoldo Morale  . NECK EXPLORATION    . SHOULDER ARTHROTOMY     multiple     There were no vitals filed for this visit.  Subjective Assessment - 11/23/16 1434    Subjective  Patient reports he is doing his HEP but it is tedious. He notes "I am on pain medication, so my pain is tolerable."    Pain Score  5     Pain Location  Back    Pain Orientation  Left;Lower         [facility] PT Assessment - 11/23/16 1532      AROM   Overall AROM Comments  Lower trap 4/5 Lt, Rhomboids 4/5 Lt.       Special Tests    Special Tests  Sacrolliac Tests;Leg LengthTest    Sacroiliac Tests   Pelvic Compression    Leg length test   -- supine, passive knee extension; WNL      Pelvic Dictraction   Findings  Negative    Comment  SI distraction      Pelvic Compression   Findings  Positive    Side  Left    comment  SI compression                  [facility] Adult PT  Treatment/Exercise - 11/23/16 1446      Lumbar Exercises: Supine   Ab Set  10 reps;5 reps    Bent Knee Raise  10 reps;3 seconds with ab set    Other Supine Lumbar Exercises  Diaphragmatic breathing equal chest and stomach x 10      Lumbar Exercises: Prone   Straight Leg Raise  10 reps;3 seconds    Straight Leg Raises Limitations  bilateral               PT Short Term Goals - 11/18/16 2047      PT SHORT TERM GOAL #1   Title  %    Status  New      PT SHORT TERM GOAL #2   Title  After 3 weeks patient will report improved tolerance to standing time by 50% to improve ability to work in home and  at work.     Status  New      PT SHORT TERM GOAL #3   Title  After three weeks patient will report ability to tolerate driving >[id number] without exacerbation of neck pain.     Status  New        PT Long Term Goals - 11/18/16 2053      PT LONG TERM GOAL #1   Title  After 6 weeks patient will demonstrate 6MWT >1823feet without increase in low back pain symptoms.     Status  New      PT LONG TERM GOAL #2   Title  After 6 weeks patient will demonstrate 30sec chair rise test >17 to improve low back strength and funcitonal activity tolerance.     Status  New      PT LONG TERM GOAL #3   Title  After 6 weeks patient will demonstrate 5/5 testing of left middle trap (prone) and left lower trap (prone) to facilitate improved mechanics of overhead movement.     Status  New            Plan - 11/23/16 1526    Clinical Impression Statement  Patient presented today with improved symptoms since initial evaluation. SIJ was further examined with posterior sheer, compression, distraction and leg length testing all presenting negative except SIJ compression (ASIS distraction) with pain on the left side. Patient had significant decreased right hip mobility and tightness noted with hip FABER and FADIR testing. He continues to have signifciant muscle tension located along the SIJ and left glute  with improved symptoms reproduction of quadratus lumborum and sacral border. He presents with slight limitation of diaphragmatic breathing with equal movement from chest and stomach throughout TrA activation exercises.  Continued focus on core activation.     Rehab Potential  Good    Clinical Impairments Affecting Rehab Potential  low kinesiophobia, good body awareness.     PT Frequency  2x / week    PT Duration  6 weeks    PT Treatment/Interventions  ADLs/Self Care Home Management;Biofeedback;Cryotherapy;Moist Heat;Gait training;Stair training;Functional mobility training;Therapeutic activities;Therapeutic exercise;Balance training;Neuromuscular re-education;Patient/family education;Manual techniques;Passive range of motion;Energy conservation;Dry needling;Taping    PT Next Visit Plan  TrA with supine clams, heel slides, SLR;  manual to lumbar QL paraspinals, SIJ testing, belly breathing transverse abd activation, MMT Right lower trap and upper trap if possible with restrictions.     PT Home Exercise Plan  none this session     Consulted and Agree with Plan of Care  Patient       Patient will benefit from skilled therapeutic intervention in order to improve the following deficits and impairments:  Abnormal gait, Hypomobility, Decreased knowledge of precautions  Visit Diagnosis: Muscle spasm of back  Cervicalgia  Sacrococcygeal disorders, not elsewhere classified  Chronic bilateral low back pain with left-sided sciatica  Muscle weakness (generalized)  Cramp and spasm     Problem List Patient Active Problem List   Diagnosis Date Noted  . Hospital discharge follow-up 09/01/2016  . Cough 09/01/2016  . Pneumonia due to Mycoplasma pneumoniae 09/01/2016  . Lobar pneumonia ([place]) 08/27/2016  . Fever, unknown origin 08/26/2016  . Sepsis due to undetermined organism ([place]) 08/25/2016  . Gram negative sepsis ([place]) 08/25/2016  . Bacteremia due to Gram-negative bacteria 08/25/2016  .  Syncope 08/25/2016  . Syncope and collapse 08/24/2016  . Sepsis due to pneumonia ([place]) 08/24/2016  . Hyperglycemia 08/24/2016  . Nausea without vomiting 07/02/2016  . Erectile  dysfunction 07/02/2016  . SLAP lesion of shoulder 08/04/2012  . Muscle weakness (generalized) 08/04/2012  . Chronic pain 08/31/2011  . Deformity, acquired 08/31/2011  . Essential hypertension 08/31/2011  . Insomnia 08/31/2011  . Epigastric discomfort 05/03/2011  . LUMBAR SPRAIN AND STRAIN 10/22/2009  . CUBITAL TUNNEL SYNDROME 10/24/2007  . SHOULDER PAIN 10/24/2007  . 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 ELBOW PAIN 05/25/2007   Starr [place] PT, DPT 3:39 PM, 11/23/16 [facility] [address] [place], [place], [address] Phone: [phone]   Fax:  [phone]  Name: Duane [name] MRN: [id number] Date of Birth: [birth year]

## 2016-11-25 ENCOUNTER — Other Ambulatory Visit: Payer: Self-pay

## 2016-11-25 ENCOUNTER — Encounter (HOSPITAL_COMMUNITY): Payer: Self-pay | Admitting: Occupational Therapy

## 2016-11-25 ENCOUNTER — Ambulatory Visit (HOSPITAL_COMMUNITY): Payer: Medicare Other | Admitting: Occupational Therapy

## 2016-11-25 ENCOUNTER — Ambulatory Visit (HOSPITAL_COMMUNITY): Payer: Medicare Other

## 2016-11-25 DIAGNOSIS — M5442 Lumbago with sciatica, left side: Secondary | ICD-10-CM

## 2016-11-25 DIAGNOSIS — M533 Sacrococcygeal disorders, not elsewhere classified: Secondary | ICD-10-CM

## 2016-11-25 DIAGNOSIS — R29898 Other symptoms and signs involving the musculoskeletal system: Secondary | ICD-10-CM

## 2016-11-25 DIAGNOSIS — M62830 Muscle spasm of back: Secondary | ICD-10-CM

## 2016-11-25 DIAGNOSIS — G8929 Other chronic pain: Secondary | ICD-10-CM

## 2016-11-25 DIAGNOSIS — M25522 Pain in left elbow: Secondary | ICD-10-CM

## 2016-11-25 DIAGNOSIS — M542 Cervicalgia: Secondary | ICD-10-CM

## 2016-11-25 NOTE — Therapy (Signed)
[place], [place], [address] Phone: [phone]   Fax:  [phone]  Physical Therapy Treatment  Patient Details  Name: Duane [name] MRN: [id number] Date of Birth: 04/24/1960 Referring Provider: Agustina Caroli, MD    Encounter Date: 11/25/2016  PT End of Session - 11/25/16 1510    Visit Number  3    Number of Visits  12    Date for PT Re-Evaluation  12/09/16    Authorization Type  Medicare     Authorization Time Period  11/18/16-12/30/16    Authorization - Visit Number  3    Authorization - Number of Visits  10    PT Start Time  1430    PT Stop Time  1660    PT Time Calculation (min)  41 min    Activity Tolerance  Patient tolerated treatment well;No increased pain    Behavior During Therapy  WFL for tasks assessed/performed       Past Medical History:  Diagnosis Date  . Arthritis   . Gout   . Hypertension     Past Surgical History:  Procedure Laterality Date  . ELBOW SURGERY    . FOOT FASCIOTOMY     X 5  . HEMORRHOID SURGERY     X2, Dr. Arnoldo Morale  . NECK EXPLORATION    . SHOULDER ARTHROTOMY     multiple     There were no vitals filed for this visit.  Subjective Assessment - 11/25/16 1433    Subjective  Pt notes that he is taking pain meds and he's having manageabel pain.     Pain Score  3                       [facility] Adult PT Treatment/Exercise - 11/25/16 1440      Lumbar Exercises: Standing   Other Standing Lumbar Exercises  Bosu marching x 20    Other Standing Lumbar Exercises  Palov on arex red tband x 10      Lumbar Exercises: Supine   Ab Set  10 reps;5 seconds    Clam  15 reps;3 seconds Red Tband    Bent Knee Raise  10 reps;3 seconds with ab set    Dead Bug Limitations  next session    Bridge Limitations  next session    Straight Leg Raise  20 reps ab set    Other Supine Lumbar Exercises  Diaphragmatic breathing equal chest and stomach x 10; belly breathing x 10;  chest breathing x 10    Other Supine Lumbar Exercises  Supine alternating bend knee fall out x 20, Adductor isometric small ball x 15,      Lumbar Exercises: Prone   Single Arm Raise  10 reps;3 seconds;Left;Right Rt. shoulder retraction    Straight Leg Raise  10 reps;3 seconds    Straight Leg Raises Limitations  bilateral    Opposite Arm/Leg Raise  10 reps;Left arm/Right leg;Right arm/Left leg      Manual Therapy   Manual Therapy  Myofascial release    Manual therapy comments  completed separately from therapeutic exercise    Joint Mobilization  Thoracic mobility PA glide II-III x 8    Myofascial Release  Myofascial release to left elbow and ulnar nerve regions to decrease pain and fascial restrictions and increase joint range of motion               PT Short Term Goals -  11/18/16 2047      PT SHORT TERM GOAL #1   Title  %    Status  New      PT SHORT TERM GOAL #2   Title  After 3 weeks patient will report improved tolerance to standing time by 50% to improve ability to work in home and at work.     Status  New      PT SHORT TERM GOAL #3   Title  After three weeks patient will report ability to tolerate driving >[id number] without exacerbation of neck pain.     Status  New        PT Long Term Goals - 11/18/16 2053      PT LONG TERM GOAL #1   Title  After 6 weeks patient will demonstrate 6MWT >1837feet without increase in low back pain symptoms.     Status  New      PT LONG TERM GOAL #2   Title  After 6 weeks patient will demonstrate 30sec chair rise test >17 to improve low back strength and funcitonal activity tolerance.     Status  New      PT LONG TERM GOAL #3   Title  After 6 weeks patient will demonstrate 5/5 testing of left middle trap (prone) and left lower trap (prone) to facilitate improved mechanics of overhead movement.     Status  New            Plan - 11/25/16 1513    Clinical Impression Statement  Patient presented today with continuation of  present symptoms. He continues to have increased tension left lumbar paraspinals and around left SIJ. He continues to complete all core exercises without complaint or visual signs of increased pain with desired form. Increased focus on manual initially to improve musculature restrictions. Added prone paraspinal activation exercise which was tolerated well.     Rehab Potential  Good    Clinical Impairments Affecting Rehab Potential  low kinesiophobia, good body awareness.     PT Frequency  2x / week    PT Duration  6 weeks    PT Treatment/Interventions  ADLs/Self Care Home Management;Biofeedback;Cryotherapy;Moist Heat;Gait training;Stair training;Functional mobility training;Therapeutic activities;Therapeutic exercise;Balance training;Neuromuscular re-education;Patient/family education;Manual techniques;Passive range of motion;Energy conservation;Dry needling;Taping    PT Next Visit Plan  continue manual to lumbar QL paraspinals, core activation/stabilization with LE functional movements, and  SIJ testing prn.      PT Home Exercise Plan  none this session     Consulted and Agree with Plan of Care  Patient       Patient will benefit from skilled therapeutic intervention in order to improve the following deficits and impairments:  Abnormal gait, Hypomobility, Decreased knowledge of precautions  Visit Diagnosis: Muscle spasm of back  Cervicalgia  Sacrococcygeal disorders, not elsewhere classified  Chronic bilateral low back pain with left-sided sciatica     Problem List Patient Active Problem List   Diagnosis Date Noted  . Hospital discharge follow-up 09/01/2016  . Cough 09/01/2016  . Pneumonia due to Mycoplasma pneumoniae 09/01/2016  . Lobar pneumonia ([place]) 08/27/2016  . Fever, unknown origin 08/26/2016  . Sepsis due to undetermined organism ([place]) 08/25/2016  . Gram negative sepsis ([place]) 08/25/2016  . Bacteremia due to Gram-negative bacteria 08/25/2016  . Syncope 08/25/2016  .  Syncope and collapse 08/24/2016  . Sepsis due to pneumonia ([place]) 08/24/2016  . Hyperglycemia 08/24/2016  . Nausea without vomiting 07/02/2016  . Erectile dysfunction 07/02/2016  . SLAP lesion of  shoulder 08/04/2012  . Muscle weakness (generalized) 08/04/2012  . Chronic pain 08/31/2011  . Deformity, acquired 08/31/2011  . Essential hypertension 08/31/2011  . Insomnia 08/31/2011  . Epigastric discomfort 05/03/2011  . LUMBAR SPRAIN AND STRAIN 10/22/2009  . CUBITAL TUNNEL SYNDROME 10/24/2007  . SHOULDER PAIN 10/24/2007  . 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 ELBOW PAIN 05/25/2007   Starr Lake PT, DPT 3:20 PM, 11/25/16 [place], [place], [address] Phone: [phone]   Fax:  [phone]  Name: Duane [name] MRN: [id number] Date of Birth: 08/07/1960

## 2016-11-25 NOTE — Patient Instructions (Signed)
Strengthening Exercises: Complete exercises 15 times each, 2 times per day.  1) Elbow flexion and extension Holding a _5__ pound weight, bend and straighten the elbow.  Complete set 1 with palm face up (traditional bicep curl). Complete set 2 with palm towards your side (hammer curl).      2) Forearm supination and pronation Holding a _5__ pound weight, stabilize elbow at your side. Slowly turn forearm palm down and then palm up.        Theraband Exercises  1) Elbow flexion and extension With your arm at your side holding an elastic band, draw up your hand by bending at the elbow. Complete set 1 with palm face up (traditional bicep curl). Complete set 2 with palm towards your side (hammer curl).  Keep your palm face up.

## 2016-11-25 NOTE — Therapy (Signed)
[place]ane [name], [place], [address] Phone: [phone]   Fax:  [phone]  Occupational Therapy Reassessment, Treatment, and Discharge Summary  Patient Details  Name: Duane [name] MRN: [id number] Date of Birth: 01/12/1960 Referring Provider: Dr. Remo Lipps Case   Encounter Date: 11/25/2016  OT End of Session - 11/25/16 1414    Visit Number  6    Number of Visits  6    Date for OT Re-Evaluation  11/26/16    Authorization Type  Medicare A & B    Authorization Time Period  Before 10th visit    Authorization - Visit Number  6    Authorization - Number of Visits  10    OT Start Time  1346    OT Stop Time  1413    OT Time Calculation (min)  27 min    Activity Tolerance  Patient tolerated treatment well    Behavior During Therapy  [facility] for tasks assessed/performed       Past Medical History:  Diagnosis Date  . Arthritis   . Gout   . Hypertension     Past Surgical History:  Procedure Laterality Date  . ELBOW SURGERY    . FOOT FASCIOTOMY     X 5  . HEMORRHOID SURGERY     X2, Dr. Arnoldo Morale  . NECK EXPLORATION    . SHOULDER ARTHROTOMY     multiple     There were no vitals filed for this visit.  Subjective Assessment - 11/25/16 1345    Subjective   S: The only time I have pain is with certain movements.     Currently in Pain?  No/denies         [facility] OT Assessment - 11/25/16 1344      Assessment   Diagnosis  s/p left elbow medial epicondyle debridement and repair; cubital tunnel release      Precautions   Precautions  Other (comment)    Precaution Comments  No heavy lifting x 6 weeks (11/19)      Palpation   Palpation comment  Trace fascial restrictions at medial epicondyle and elbow region      AROM   Overall AROM   Within functional limits for tasks performed    Left Elbow Flexion  145 same as previous    Left Elbow Extension  -3 -20 previous      PROM   Overall PROM   Within functional limits for  tasks performed      Strength   Right/Left Elbow  Left    Left Elbow Flexion  4+/5    Left Elbow Extension  4+/5    Right/Left Forearm  Left    Left Forearm Pronation  4+/5    Left Forearm Supination  4+/5    Right/Left hand  Left    Left Hand Gross Grasp  Functional    Left Hand Grip (lbs)  101 49 previous    Left Hand Lateral Pinch  18 lbs 8 previous    Left Hand 3 Point Pinch  21 lbs 9 previous               OT Treatments/Exercises (OP) - 11/25/16 1348      Exercises   Exercises  Elbow;Wrist;Hand      Elbow Exercises   Elbow Flexion  PROM;Strengthening;Theraband;15 reps    Bar Weights/Barbell (Elbow Flexion)  5 lbs    Elbow Extension  PROM;Strengthening;Theraband;15 reps    Theraband Level (  Elbow Extension)  Level 3 (Green)    Bar Weights/Barbell (Elbow Extension)  5 lbs    Forearm Supination  Strengthening;15 reps 5#    Forearm Pronation  Strengthening;15 reps 5#    Other elbow exercises  weighted elbow extension stretch: 5#  for 2'    Other elbow exercises  hammer curl 5# 15x, elbow extension with forearm pronated 4# 15X      Manual Therapy   Manual Therapy  Myofascial release    Manual therapy comments  completed separately from therapeutic exercise    Myofascial Release  Myofascial release to left elbow and ulnar nerve regions to decrease pain and fascial restrictions and increase joint range of motion               OT Short Term Goals - 12-16-16 1359      OT SHORT TERM GOAL #1   Title  Pt will be provided with and educated on HEP for improved mobility and functional strength of LUE.     Time  3    Period  Weeks    Status  Achieved      OT SHORT TERM GOAL #2   Title  Pt will have LUE pain 3/10 or less to improve ability to use LUE as dominant during daily task completion.     Time  3    Period  Weeks    Status  Achieved      OT SHORT TERM GOAL #3   Title  Pt will improve LUE strength to 4+/5 to improve ability to complete lifting tasks  with LUE.     Time  3    Period  Weeks    Status  Achieved      OT SHORT TERM GOAL #4   Title  Pt will improve left grip strength by 15# and pinch strength by 3# to improve ability to grasp and hold objects.     Time  3    Period  Weeks    Status  Achieved      OT SHORT TERM GOAL #5   Title  Pt will return to highest level of functioning using LUE as dominant for all daily and work tasks.     Time  3    Period  Weeks    Status  Achieved               Plan - 12-16-16 1415    Clinical Impression Statement  A: Reassessment completed this date, pt has made good progress with ROM, strength, grip strength, and functional task completion meeting 5/5 goals. Pt reports occasional discomfort with sustained use or certain movements, is not experiencing constant pain. Pt is agreeable to discharge with HEP.     Plan  P: Discharge pt.        Patient will benefit from skilled therapeutic intervention in order to improve the following deficits and impairments:  Decreased strength, Decreased activity tolerance, Impaired flexibility, Decreased range of motion, Pain, Impaired UE functional use  Visit Diagnosis: Pain in left elbow  Other symptoms and signs involving the musculoskeletal system  G-Codes - Dec 16, 2016 1417    Functional Assessment Tool Used (Outpatient only)  clinical judgement    Functional Limitation  Carrying, moving and handling objects    Carrying, Moving and Handling Objects Goal Status ([id number])  At least 1 percent but less than 20 percent impaired, limited or restricted    Carrying, Moving and Handling Objects Discharge Status [phone])  At least  1 percent but less than 20 percent impaired, limited or restricted       Problem List Patient Active Problem List   Diagnosis Date Noted  . Hospital discharge follow-up 09/01/2016  . Cough 09/01/2016  . Pneumonia due to Mycoplasma pneumoniae 09/01/2016  . Lobar pneumonia ([place]) 08/27/2016  . Fever, unknown origin 08/26/2016   . Sepsis due to undetermined organism ([place]) 08/25/2016  . Gram negative sepsis ([place]) 08/25/2016  . Bacteremia due to Gram-negative bacteria 08/25/2016  . Syncope 08/25/2016  . Syncope and collapse 08/24/2016  . Sepsis due to pneumonia ([place]) 08/24/2016  . Hyperglycemia 08/24/2016  . Nausea without vomiting 07/02/2016  . Erectile dysfunction 07/02/2016  . SLAP lesion of shoulder 08/04/2012  . Muscle weakness (generalized) 08/04/2012  . Chronic pain 08/31/2011  . Deformity, acquired 08/31/2011  . Essential hypertension 08/31/2011  . Insomnia 08/31/2011  . Epigastric discomfort 05/03/2011  . LUMBAR SPRAIN AND STRAIN 10/22/2009  . CUBITAL TUNNEL SYNDROME 10/24/2007  . SHOULDER PAIN 10/24/2007  . 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 ELBOW PAIN 05/25/2007   Guadelupe Sabin, OTR/L  [phone] 11/25/2016, 2:20 PM  [place], [place], [address] Phone: [phone]   Fax:  [phone]  Name: Duane [name] MRN: [id number] Date of Birth: 03-05-1960   OCCUPATIONAL THERAPY DISCHARGE SUMMARY  Visits from Start of Care: 6  Current functional level related to goals / functional outcomes: See above. Pt is able to use LUE as dominant with minimal pain during B/IADL completion.    Remaining deficits: Pt reports occasional pain/discomfort with certain movements   Education / Equipment: Strengthening HEP  Plan: Patient agrees to discharge.  Patient goals were met. Patient is being discharged due to meeting the stated rehab goals.  ?????

## 2016-11-29 ENCOUNTER — Ambulatory Visit (HOSPITAL_COMMUNITY): Payer: Medicare Other

## 2016-11-29 DIAGNOSIS — M25522 Pain in left elbow: Secondary | ICD-10-CM | POA: Diagnosis not present

## 2016-11-29 DIAGNOSIS — G8929 Other chronic pain: Secondary | ICD-10-CM

## 2016-11-29 DIAGNOSIS — M542 Cervicalgia: Secondary | ICD-10-CM

## 2016-11-29 DIAGNOSIS — M533 Sacrococcygeal disorders, not elsewhere classified: Secondary | ICD-10-CM

## 2016-11-29 DIAGNOSIS — M62830 Muscle spasm of back: Secondary | ICD-10-CM

## 2016-11-29 DIAGNOSIS — M5442 Lumbago with sciatica, left side: Secondary | ICD-10-CM

## 2016-11-29 NOTE — Therapy (Signed)
[place], [place], [address] Phone: [phone]   Fax:  [phone]  Physical Therapy Treatment  Patient Details  Name: Duane [name] MRN: [id number] Date of Birth: 1960/07/08 Referring Provider: Agustina Caroli, MD    Encounter Date: 11/29/2016  PT End of Session - 11/29/16 1510    Visit Number  4    Number of Visits  12    Date for PT Re-Evaluation  12/09/16    Authorization Type  Medicare     Authorization Time Period  11/18/16-12/30/16    Authorization - Visit Number  4    Authorization - Number of Visits  10    PT Start Time  2376    PT Stop Time  2831    PT Time Calculation (min)  51 min       Past Medical History:  Diagnosis Date  . Arthritis   . Gout   . Hypertension     Past Surgical History:  Procedure Laterality Date  . ELBOW SURGERY    . FOOT FASCIOTOMY     X 5  . HEMORRHOID SURGERY     X2, Dr. Arnoldo Morale  . NECK EXPLORATION    . SHOULDER ARTHROTOMY     multiple     There were no vitals filed for this visit.  Subjective Assessment - 11/29/16 1307    Subjective  Pt reports HEP is going well overall. He says manual therapy was hel;pful in low back pain issues overall thus far. is doen with OT for elbow surgery, but has not returned to surgeon for full clearnance yet.   (Pended)     Currently in Pain?  Yes  (Pended)     Pain Score  4   (Pended)        Trigger point Dry Needling/Myofascial Release/PROM stretching:  1. Left posterior gluteus medius- minimal twitch elicitation, but significant reproduction and reduction of symptomatic pain; generally tight throughout, with deep tenderness. Responds well to sustained  focal release, and is also combined with Hip internal/external rotation to improve ROM and tissue length.  2. Quadratus Lumborum- mild exacerbation of symptoms, but no clear focal increase in tissue resistance, improved more with MFR, with improved tolerance to tissue pressure,  allowing for improved digital stretch and general muscle compliance to trunk rotational movements.  3. Lumbar paraspinals-  strong response, multiple strong twitches elicited almost immediately, particularly with lateral deep approach and bands that are central and superficial at L2-4 level.  4. Thoracolumbar paraspinals-  tight, more total tissue spam than focal taut bands of tissue, but overall minimal response to lateral approach needling, just discomfort. Plan is return to this are next session to allow for more time. Likely will respond better to manual release than to needling.  5. Thoracolumbar multifidus- very reactive, responsive increases in resting tension, likely in part due to guarding; difficult to elicit relation, but more intense and increased reproduction of symptomatic back ache.   6. Left Upper Trapezius- significant spasm and shortening noted, but minimal twitch response. Most of taut bands are more anterior, but unable to pull anterior portion away enough to perform needling here. Most time spent with pincer grip release near anterior clavicular/scapular attachment, some integration of Right neck rotation toward the end.   *In total less than 10 cumulative minutes spent dry needling, and majority of manual performed with manual release techniques using hands.       [facility] Adult PT Treatment/Exercise - 11/29/16 0001  Manual Therapy   Manual Therapy  Myofascial release       Trigger Point Dry Needling - 11/29/16 1451    Consent Given?  Yes    Education Handout Provided  Yes    Muscles Treated Upper Body  Upper trapezius    Muscles Treated Lower Body  Piriformis glute medius, lumbar multifidus, lumbar paraspinals, Lt QL.     Piriformis Response  Twitch response elicited;Palpable increased muscle length             PT Short Term Goals - 11/18/16 2047      PT SHORT TERM GOAL #1   Title  %    Status  New      PT SHORT TERM GOAL #2   Title  After 3 weeks  patient will report improved tolerance to standing time by 50% to improve ability to work in home and at work.     Status  New      PT SHORT TERM GOAL #3   Title  After three weeks patient will report ability to tolerate driving >[id number] without exacerbation of neck pain.     Status  New        PT Long Term Goals - 11/18/16 2053      PT LONG TERM GOAL #1   Title  After 6 weeks patient will demonstrate 6MWT >1821feet without increase in low back pain symptoms.     Status  New      PT LONG TERM GOAL #2   Title  After 6 weeks patient will demonstrate 30sec chair rise test >17 to improve low back strength and funcitonal activity tolerance.     Status  New      PT LONG TERM GOAL #3   Title  After 6 weeks patient will demonstrate 5/5 testing of left middle trap (prone) and left lower trap (prone) to facilitate improved mechanics of overhead movement.     Status  New            Plan - 11/29/16 1511    Clinical Impression Statement  Session with heavy emphasis on manual therapy and trial of dry needling combined with MFR to address spasm and tight band in Left sided muscularture. Good resposne overall, with reduction in pain and improved mobility. Heavy education with patient on supportive role of manual therapies early in rehab process to improve mobility and improve tolerance to exercises in session and at home.     Rehab Potential  Good    Clinical Impairments Affecting Rehab Potential  low kinesiophobia, good body awareness.     PT Frequency  2x / week    PT Duration  6 weeks    PT Treatment/Interventions  ADLs/Self Care Home Management;Biofeedback;Cryotherapy;Moist Heat;Gait training;Stair training;Functional mobility training;Therapeutic activities;Therapeutic exercise;Balance training;Neuromuscular re-education;Patient/family education;Manual techniques;Passive range of motion;Energy conservation;Dry needling;Taping    PT Next Visit Plan  More targeted manual therapy to Left  QL, lumbar extensors, and glut emed. Hip rotation mobility, glute med/max strengthening.     PT Home Exercise Plan  none this session     Consulted and Agree with Plan of Care  Patient       Patient will benefit from skilled therapeutic intervention in order to improve the following deficits and impairments:  Abnormal gait, Hypomobility, Decreased knowledge of precautions  Visit Diagnosis: Muscle spasm of back  Cervicalgia  Sacrococcygeal disorders, not elsewhere classified  Chronic bilateral low back pain with left-sided sciatica     Problem List Patient Active  Problem List   Diagnosis Date Noted  . Hospital discharge follow-up 09/01/2016  . Cough 09/01/2016  . Pneumonia due to Mycoplasma pneumoniae 09/01/2016  . Lobar pneumonia ([place]) 08/27/2016  . Fever, unknown origin 08/26/2016  . Sepsis due to undetermined organism ([place]) 08/25/2016  . Gram negative sepsis ([place]) 08/25/2016  . Bacteremia due to Gram-negative bacteria 08/25/2016  . Syncope 08/25/2016  . Syncope and collapse 08/24/2016  . Sepsis due to pneumonia ([place]) 08/24/2016  . Hyperglycemia 08/24/2016  . Nausea without vomiting 07/02/2016  . Erectile dysfunction 07/02/2016  . SLAP lesion of shoulder 08/04/2012  . Muscle weakness (generalized) 08/04/2012  . Chronic pain 08/31/2011  . Deformity, acquired 08/31/2011  . Essential hypertension 08/31/2011  . Insomnia 08/31/2011  . Epigastric discomfort 05/03/2011  . LUMBAR SPRAIN AND STRAIN 10/22/2009  . CUBITAL TUNNEL SYNDROME 10/24/2007  . SHOULDER PAIN 10/24/2007  . 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 ELBOW PAIN 05/25/2007    3:16 PM, 11/29/16 Etta Grandchild, PT, DPT Physical Therapist at [facility] [phone] (office)      Etta Grandchild 11/29/2016, 3:15 PM  [facility] [address] [place], [place], [address] Phone: [phone]   Fax:  [phone]  Name: DAIMON KEAN MRN: [id number] Date of Birth: 1961-01-03

## 2016-12-01 ENCOUNTER — Ambulatory Visit: Payer: Self-pay | Admitting: Emergency Medicine

## 2016-12-01 ENCOUNTER — Encounter: Payer: Self-pay | Admitting: Emergency Medicine

## 2016-12-01 ENCOUNTER — Other Ambulatory Visit: Payer: Self-pay

## 2016-12-01 ENCOUNTER — Ambulatory Visit (HOSPITAL_COMMUNITY): Payer: Medicare Other

## 2016-12-01 ENCOUNTER — Encounter (HOSPITAL_COMMUNITY): Payer: Self-pay

## 2016-12-01 ENCOUNTER — Ambulatory Visit (INDEPENDENT_AMBULATORY_CARE_PROVIDER_SITE_OTHER): Payer: Medicare Other | Admitting: Emergency Medicine

## 2016-12-01 VITALS — BP 118/72 | HR 89 | Temp 98.6°F | Resp 16 | Ht 70.08 in | Wt 163.0 lb

## 2016-12-01 DIAGNOSIS — M533 Sacrococcygeal disorders, not elsewhere classified: Secondary | ICD-10-CM

## 2016-12-01 DIAGNOSIS — M549 Dorsalgia, unspecified: Secondary | ICD-10-CM

## 2016-12-01 DIAGNOSIS — G8929 Other chronic pain: Secondary | ICD-10-CM | POA: Diagnosis not present

## 2016-12-01 DIAGNOSIS — M542 Cervicalgia: Secondary | ICD-10-CM

## 2016-12-01 DIAGNOSIS — M62830 Muscle spasm of back: Secondary | ICD-10-CM

## 2016-12-01 DIAGNOSIS — M25522 Pain in left elbow: Secondary | ICD-10-CM | POA: Diagnosis not present

## 2016-12-01 NOTE — Therapy (Signed)
[place], [place], [address] Phone: [phone]   Fax:  [phone]  Physical Therapy Treatment  Patient Details  Name: Duane [name] MRN: [id number] Date of Birth: 1960-07-18 Referring Provider: Agustina Caroli, MD    Encounter Date: 12/01/2016  PT End of Session - 12/01/16 1259    Visit Number  5    Number of Visits  12    Date for PT Re-Evaluation  12/09/16    Authorization Type  Medicare     Authorization Time Period  11/18/16-12/30/16    Authorization - Visit Number  5    Authorization - Number of Visits  10    PT Start Time  1300    PT Stop Time  1342    PT Time Calculation (min)  42 min       Past Medical History:  Diagnosis Date  . Arthritis   . Gout   . Hypertension     Past Surgical History:  Procedure Laterality Date  . ELBOW SURGERY    . FOOT FASCIOTOMY     X 5  . HEMORRHOID SURGERY     X2, Dr. Arnoldo Morale  . NECK EXPLORATION    . SHOULDER ARTHROTOMY     multiple     There were no vitals filed for this visit.  Subjective Assessment - 12/01/16 1259    Subjective  Pt reports feeling okay today. He reports having back and neck pain, some in his elbow but he is "medicated." He reports feeling sore following dry needling last session but somewhat better overall.    Currently in Pain?  Yes    Pain Score  4     Pain Location  Back    Pain Orientation  Left;Lower    Pain Descriptors / Indicators  Sore    Pain Type  Acute pain    Pain Onset  1 to 4 weeks ago    Pain Frequency  Intermittent    Aggravating Factors   certain movement    Pain Relieving Factors  rest    Effect of Pain on Daily Activities  min effect on ADLs          [facility] Adult PT Treatment/Exercise - 12/01/16 0001      Lumbar Exercises: Stretches   Lower Trunk Rotation Limitations  10x10" for hip mobility    Hip Flexor Stretch Limitations  L hip ER/IR PROM 5x15" each      Lumbar Exercises: Standing   Other Standing  Lumbar Exercises  sidestepping with GTB 66ft x3RT      Lumbar Exercises: Seated   Sit to Stand  15 reps    Sit to Stand Limitations  with GTB      Lumbar Exercises: Supine   Bridge  15 reps    Bridge Limitations  with ankle DF to increase posterior chain engagement      Lumbar Exercises: Sidelying   Clam  15 reps    Clam Limitations  BLE, GTB      Manual Therapy   Manual Therapy  Myofascial release    Manual therapy comments  completed separately from therapeutic exercise    Myofascial Release  L lumbar paraspinals, L QL, L glute med          PT Education - 12/01/16 1259    Education provided  Yes    Education Details  can add LTRs to HEP for improved hip mobility    Person(s) Educated  Patient  Methods  Explanation;Demonstration    Comprehension  Verbalized understanding;Returned demonstration       PT Short Term Goals - 11/18/16 2047      PT SHORT TERM GOAL #1   Title  %    Status  New      PT SHORT TERM GOAL #2   Title  After 3 weeks patient will report improved tolerance to standing time by 50% to improve ability to work in home and at work.     Status  New      PT SHORT TERM GOAL #3   Title  After three weeks patient will report ability to tolerate driving >[id number] without exacerbation of neck pain.     Status  New        PT Long Term Goals - 11/18/16 2053      PT LONG TERM GOAL #1   Title  After 6 weeks patient will demonstrate 6MWT >1883feet without increase in low back pain symptoms.     Status  New      PT LONG TERM GOAL #2   Title  After 6 weeks patient will demonstrate 30sec chair rise test >17 to improve low back strength and funcitonal activity tolerance.     Status  New      PT LONG TERM GOAL #3   Title  After 6 weeks patient will demonstrate 5/5 testing of left middle trap (prone) and left lower trap (prone) to facilitate improved mechanics of overhead movement.     Status  New            Plan - 12/01/16 1457    Clinical  Impression Statement  Began session with MFR to L paraspinals, QL, and glute med. Pt with continued increased tightness throughout, especially at paraspinals. Added LTRs and passive hip IR/ER stretch for improved hip mobility. Ended session with gluteal strengthening. Pt denied any pain during session and reported feeling looser in the L hip at EOS. Educated him to add LTRs to his HEP for continued hip mobility.    Rehab Potential  Good    Clinical Impairments Affecting Rehab Potential  low kinesiophobia, good body awareness.     PT Frequency  2x / week    PT Duration  6 weeks    PT Treatment/Interventions  ADLs/Self Care Home Management;Biofeedback;Cryotherapy;Moist Heat;Gait training;Stair training;Functional mobility training;Therapeutic activities;Therapeutic exercise;Balance training;Neuromuscular re-education;Patient/family education;Manual techniques;Passive range of motion;Energy conservation;Dry needling;Taping    PT Next Visit Plan  Continue targeted manual therapy to Left QL, lumbar extensors, and glute med. Hip rotation mobility; progress glute med/max strengthening as tolerated    PT Home Exercise Plan  11/21: LTRs for hip mobility    Consulted and Agree with Plan of Care  Patient       Patient will benefit from skilled therapeutic intervention in order to improve the following deficits and impairments:  Abnormal gait, Hypomobility, Decreased knowledge of precautions  Visit Diagnosis: Muscle spasm of back  Cervicalgia  Sacrococcygeal disorders, not elsewhere classified     Problem List Patient Active Problem List   Diagnosis Date Noted  . Hospital discharge follow-up 09/01/2016  . Cough 09/01/2016  . Pneumonia due to Mycoplasma pneumoniae 09/01/2016  . Lobar pneumonia ([place]) 08/27/2016  . Fever, unknown origin 08/26/2016  . Sepsis due to undetermined organism ([place]) 08/25/2016  . Gram negative sepsis ([place]) 08/25/2016  . Bacteremia due to Gram-negative bacteria 08/25/2016   . Syncope 08/25/2016  . Syncope and collapse 08/24/2016  . Sepsis due to pneumonia (  [place]) 08/24/2016  . Hyperglycemia 08/24/2016  . Nausea without vomiting 07/02/2016  . Erectile dysfunction 07/02/2016  . SLAP lesion of shoulder 08/04/2012  . Muscle weakness (generalized) 08/04/2012  . Chronic pain 08/31/2011  . Deformity, acquired 08/31/2011  . Essential hypertension 08/31/2011  . Insomnia 08/31/2011  . Epigastric discomfort 05/03/2011  . LUMBAR SPRAIN AND STRAIN 10/22/2009  . CUBITAL TUNNEL SYNDROME 10/24/2007  . SHOULDER PAIN 10/24/2007  . 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 ELBOW PAIN 05/25/2007       Geraldine Solar PT, DPT  [facility] [address] [place], [place], [address] Phone: [phone]   Fax:  [phone]  Name: Duane [name] MRN: [id number] Date of Birth: March 16, 1960

## 2016-12-01 NOTE — Patient Instructions (Addendum)
IF you received an x-ray today, you will receive an invoice from [facility]. Please contact [facility] at [phone] with questions or concerns regarding your invoice.   IF you received labwork today, you will receive an invoice from [facility]. Please contact [facility] at [phone] with questions or concerns regarding your invoice.   Our billing staff will not be able to assist you with questions regarding bills from these companies.  You will be contacted with the lab results as soon as they are available. The fastest way to get your results is to activate your My Chart account. Instructions are located on the last page of this paperwork. If you have not heard from [place] regarding the results in 2 weeks, please contact this office.      Chronic Pain, Adult Chronic pain is a type of pain that lasts or keeps coming back (recurs) for at least six months. You may have chronic headaches, abdominal pain, or body pain. Chronic pain may be related to an illness, such as fibromyalgia or complex regional pain syndrome. Sometimes the cause of chronic pain is not known. Chronic pain can make it hard for you to do daily activities. If not treated, chronic pain can lead to other health problems, including anxiety and depression. Treatment depends on the cause and severity of your pain. You may need to work with a pain specialist to come up with a treatment plan. The plan may include medicine, counseling, and physical therapy. Many people benefit from a combination of two or more types of treatment to control their pain. Follow these instructions at home: Lifestyle  Consider keeping a pain diary to share with your health care providers.  Consider talking with a mental health care provider (psychologist) about how to cope with chronic pain.  Consider joining a chronic pain support group.  Try to control or lower your stress levels. Talk to your health care provider about  strategies to do this. General instructions   Take over-the-counter and prescription medicines only as told by your health care provider.  Follow your treatment plan as told by your health care provider. This may include: ? Gentle, regular exercise. ? Eating a healthy diet that includes foods such as vegetables, fruits, fish, and lean meats. ? Cognitive or behavioral therapy. ? Working with a Community education officer. ? Meditation or yoga. ? Acupuncture or massage therapy. ? Aroma, color, light, or sound therapy. ? Local electrical stimulation. ? Shots (injections) of numbing or pain-relieving medicines into the spine or the area of pain.  Check your pain level as told by your health care provider. Ask your health care provider if you should use a pain scale.  Learn as much as you can about how to manage your chronic pain. Ask your health care provider if an intensive pain rehabilitation program or a chronic pain specialist would be helpful.  Keep all follow-up visits as told by your health care provider. This is important. Contact a health care provider if:  Your pain gets worse.  You have new pain.  You have trouble sleeping.  You have trouble doing your normal activities.  Your pain is not controlled with treatment.  Your have side effects from pain medicine.  You feel weak. Get help right away if:  You lose feeling or have numbness in your body.  You lose control of bowel or bladder function.  Your pain suddenly gets much worse.  You develop shaking or chills.  You develop confusion.  You  develop chest pain.  You have trouble breathing or shortness of breath.  You pass out.  You have thoughts about hurting yourself or others. This information is not intended to replace advice given to you by your health care provider. Make sure you discuss any questions you have with your health care provider. Document Released: 09/19/2001 Document Revised: 08/28/2015 Document  Reviewed: 06/17/2015 Elsevier Interactive Patient Education  2017 [employer].
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
## 2016-12-06 ENCOUNTER — Ambulatory Visit (HOSPITAL_COMMUNITY): Payer: Medicare Other

## 2016-12-06 DIAGNOSIS — M25522 Pain in left elbow: Secondary | ICD-10-CM | POA: Diagnosis not present

## 2016-12-06 DIAGNOSIS — M5442 Lumbago with sciatica, left side: Secondary | ICD-10-CM

## 2016-12-06 DIAGNOSIS — M542 Cervicalgia: Secondary | ICD-10-CM

## 2016-12-06 DIAGNOSIS — M62830 Muscle spasm of back: Secondary | ICD-10-CM

## 2016-12-06 DIAGNOSIS — M533 Sacrococcygeal disorders, not elsewhere classified: Secondary | ICD-10-CM

## 2016-12-06 DIAGNOSIS — G8929 Other chronic pain: Secondary | ICD-10-CM

## 2016-12-06 NOTE — Therapy (Signed)
[facility] [place], [place], [address] Phone: [phone]   Fax:  [phone]  Physical Therapy Treatment  Patient Details  Name: Duane [name] MRN: [id number] Date of Birth: 1960/03/22 Referring Provider: Agustina Caroli, MD    Encounter Date: 12/06/2016  PT End of Session - 12/06/16 1343    Visit Number  6    Number of Visits  12    Date for PT Re-Evaluation  12/09/16    Authorization Type  Medicare     Authorization Time Period  11/18/16-12/30/16    Authorization - Visit Number  6    Authorization - Number of Visits  10    PT Start Time  6301    PT Stop Time  1346    PT Time Calculation (min)  42 min    Activity Tolerance  Patient tolerated treatment well;No increased pain    Behavior During Therapy  WFL for tasks assessed/performed       Past Medical History:  Diagnosis Date  . Arthritis   . Gout   . Hypertension     Past Surgical History:  Procedure Laterality Date  . ELBOW SURGERY    . FOOT FASCIOTOMY     X 5  . HEMORRHOID SURGERY     X2, Dr. Arnoldo Morale  . NECK EXPLORATION    . SHOULDER ARTHROTOMY     multiple     There were no vitals filed for this visit.                   [facility] Adult PT Treatment/Exercise - 12/06/16 0001      Lumbar Exercises: Stretches   Lower Trunk Rotation Limitations  Sidelying rotation, open book: 2x30sec bilat    Piriformis Stretch  2 reps;30 seconds seated hip external rotation: 2x30sec bilat      Lumbar Exercises: Seated   Sit to Stand  10 reps 2x10 from, 14" box        Trigger point Dry Needling/Myofascial Release/PROM stretching:  1. Left posterior gluteus medius- moderate twitch response in deep layers. Responds well to sustained focal release. 2. Lumbar paraspinals bilat-  mild response to dry needling, but excellent improvement in spasm to mysofascial release 3. Myofascial release to Left quadratus lumborum.    *total time spent dry needling <4  minutes, not billed;      PT Short Term Goals - 11/18/16 2047      PT SHORT TERM GOAL #1   Title  %    Status  New      PT SHORT TERM GOAL #2   Title  After 3 weeks patient will report improved tolerance to standing time by 50% to improve ability to work in home and at work.     Status  New      PT SHORT TERM GOAL #3   Title  After three weeks patient will report ability to tolerate driving >[id number] without exacerbation of neck pain.     Status  New        PT Long Term Goals - 11/18/16 2053      PT LONG TERM GOAL #1   Title  After 6 weeks patient will demonstrate 6MWT >1819feet without increase in low back pain symptoms.     Status  New      PT LONG TERM GOAL #2   Title  After 6 weeks patient will demonstrate 30sec chair rise test >17 to improve low back strength and funcitonal  activity tolerance.     Status  New      PT LONG TERM GOAL #3   Title  After 6 weeks patient will demonstrate 5/5 testing of left middle trap (prone) and left lower trap (prone) to facilitate improved mechanics of overhead movement.     Status  New            Plan - 12/06/16 1344    Clinical Impression Statement  Continued with heavy manual therapy this session and progression of hip loading. Updated HEP to include two stretch for low back. Pt report improvement in low back pain overall.     Rehab Potential  Good    Clinical Impairments Affecting Rehab Potential  low kinesiophobia, good body awareness.     PT Frequency  2x / week    PT Duration  6 weeks    PT Treatment/Interventions  ADLs/Self Care Home Management;Biofeedback;Cryotherapy;Moist Heat;Gait training;Stair training;Functional mobility training;Therapeutic activities;Therapeutic exercise;Balance training;Neuromuscular re-education;Patient/family education;Manual techniques;Passive range of motion;Energy conservation;Dry needling;Taping    PT Next Visit Plan  Reassessment: continue targeted manual therapy to Left QL, lumbar  extensors, and glute med. Hip rotation mobility; progress glute med/max strengthening as tolerated    PT Home Exercise Plan  11/21: LTRs for hip mobility    Consulted and Agree with Plan of Care  Patient       Patient will benefit from skilled therapeutic intervention in order to improve the following deficits and impairments:  Abnormal gait, Hypomobility, Decreased knowledge of precautions  Visit Diagnosis: Muscle spasm of back  Cervicalgia  Sacrococcygeal disorders, not elsewhere classified  Chronic bilateral low back pain with left-sided sciatica     Problem List Patient Active Problem List   Diagnosis Date Noted  . Hospital discharge follow-up 09/01/2016  . Cough 09/01/2016  . Pneumonia due to Mycoplasma pneumoniae 09/01/2016  . Lobar pneumonia ([place]) 08/27/2016  . Fever, unknown origin 08/26/2016  . Sepsis due to undetermined organism ([place]) 08/25/2016  . Gram negative sepsis ([place]) 08/25/2016  . Bacteremia due to Gram-negative bacteria 08/25/2016  . Syncope 08/25/2016  . Syncope and collapse 08/24/2016  . Sepsis due to pneumonia ([place]) 08/24/2016  . Hyperglycemia 08/24/2016  . Nausea without vomiting 07/02/2016  . Erectile dysfunction 07/02/2016  . SLAP lesion of shoulder 08/04/2012  . Muscle weakness (generalized) 08/04/2012  . Chronic pain 08/31/2011  . Deformity, acquired 08/31/2011  . Essential hypertension 08/31/2011  . Insomnia 08/31/2011  . Epigastric discomfort 05/03/2011  . LUMBAR SPRAIN AND STRAIN 10/22/2009  . CUBITAL TUNNEL SYNDROME 10/24/2007  . SHOULDER PAIN 10/24/2007  . 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 ELBOW PAIN 05/25/2007    2:01 PM, 12/06/16 Etta Grandchild, PT, DPT Physical Therapist - [facility] [phone] (Office)    Etta Grandchild 12/06/2016, 1:56 PM  [facility] [address] [place], [place], [address] Phone: [phone]   Fax:  [phone]  Name:  Duane [name] MRN: [id number] Date of Birth: [birth year]

## 2016-12-08 ENCOUNTER — Ambulatory Visit (HOSPITAL_COMMUNITY): Payer: Medicare Other | Admitting: Physical Therapy

## 2016-12-08 DIAGNOSIS — M542 Cervicalgia: Secondary | ICD-10-CM

## 2016-12-08 DIAGNOSIS — M533 Sacrococcygeal disorders, not elsewhere classified: Secondary | ICD-10-CM

## 2016-12-08 DIAGNOSIS — M62830 Muscle spasm of back: Secondary | ICD-10-CM

## 2016-12-08 DIAGNOSIS — M25522 Pain in left elbow: Secondary | ICD-10-CM | POA: Diagnosis not present

## 2016-12-08 NOTE — Therapy (Signed)
[facility] [place], [place], [address] Phone: [phone]   Fax:  [phone]  Physical Therapy Treatment  Patient Details  Name: Duane [name] MRN: [id number] Date of Birth: 09/23/1960 Referring Provider: Agustina Caroli, MD    Encounter Date: 12/08/2016  PT End of Session - 12/08/16 1352    Visit Number  7    Number of Visits  12    Date for PT Re-Evaluation  12/30/16    Authorization Type  Medicare     Authorization Time Period  11/18/16-12/30/16    Authorization - Visit Number  7    Authorization - Number of Visits  10    PT Start Time  1304    PT Stop Time  1342    PT Time Calculation (min)  38 min    Activity Tolerance  Patient tolerated treatment well    Behavior During Therapy  [facility] for tasks assessed/performed       Past Medical History:  Diagnosis Date  . Arthritis   . Gout   . Hypertension     Past Surgical History:  Procedure Laterality Date  . ELBOW SURGERY    . FOOT FASCIOTOMY     X 5  . HEMORRHOID SURGERY     X2, Dr. Arnoldo Morale  . NECK EXPLORATION    . SHOULDER ARTHROTOMY     multiple     There were no vitals filed for this visit.  Subjective Assessment - 12/08/16 1306    Subjective  Patient reports that he is feeling better since starting PT, the soft tissue work and needling really seems to be helping. It is hard for him to sit or stand for a long period, also walking for a long period is difficult as well. He feels that the manipulations and mobilizations have been helping the most. He rates himself as being 75% out of 100% with teh last 25% being pain related.     How long can you sit comfortably?  11/28- 15 minutes before he has to shift     How long can you stand comfortably?  11/28- 10 minutes before leaning     How long can you walk comfortably?  11/28- 1/4 mile     Diagnostic tests  MRI shows no specific/acute cause of back pain/radiculopathy     Patient Stated Goals  reduce pain     Currently in Pain?  Yes    Pain Score  4     Pain Location  Back    Pain Orientation  Left;Lower    Pain Descriptors / Indicators  Sore;Pressure    Pain Type  Chronic pain    Pain Radiating Towards  none     Pain Onset  1 to 4 weeks ago    Pain Frequency  Intermittent    Aggravating Factors   doing any one thing for too long     Pain Relieving Factors  ice, heat, medication     Effect of Pain on Daily Activities  min effect             See clinical impression statement for assessment of soft tissue structures performed this session          [facility] Adult PT Treatment/Exercise - 12/08/16 0001      Manual Therapy   Manual Therapy  Soft tissue mobilization    Manual therapy comments  completed separately from therapeutic exercise    Myofascial Release  B lumbar paraspinals  PT Education - 12/08/16 1351    Education provided  Yes    Education Details  progress towards goals, POC moving forward, F/U with dry needling     Person(s) Educated  Patient    Methods  Explanation    Comprehension  Verbalized understanding       PT Short Term Goals - 12/08/16 1319      PT SHORT TERM GOAL #2   Title  After 3 weeks patient will report improved tolerance to standing time by 50% to improve ability to work in home and at work.     Baseline  11/28- improving but has not met goal     Time  3    Period  Weeks    Status  On-going      PT SHORT TERM GOAL #3   Title  After three weeks patient will report ability to tolerate driving >36IWOEHOZ without exacerbation of neck pain.     Baseline  11/28- improving, has come very close to meeting goal     Time  3    Period  Weeks    Status  Partially Met        PT Long Term Goals - 12/08/16 1321      PT LONG TERM GOAL #1   Title  After 6 weeks patient will demonstrate 6MWT >1882fet without increase in low back pain symptoms.     Baseline  11/28- stiffness, not pain     Time  6    Period  Weeks    Status   Achieved      PT LONG TERM GOAL #2   Title  After 6 weeks patient will demonstrate 30sec chair rise test >17 to improve low back strength and funcitonal activity tolerance.     Baseline  11/28- 13     Time  6    Period  Weeks    Status  Achieved      PT LONG TERM GOAL #3   Title  After 6 weeks patient will demonstrate 5/5 testing of left middle trap (prone) and left lower trap (prone) to facilitate improved mechanics of overhead movement.     Baseline  11/28- ongoing     Time  6    Period  Weeks    Status  On-going            Plan - 12/08/16 1401    Clinical Impression Statement  Re-assessment performed today, primarily focused on soft tissue limitations identified at evaluation. Patient appears to be improving in general, however does not appear to have met goals/does not appear ready for DC at this time as he is continuing to benefit from skilled PT services as can be evidenced by reports of improved QOL. Continued with heavy manual focus for session today with good tolerance and outcome noted. Recommend continuation of skilled PT services, to be completed with evaluating PT as able for dry needling follow-up, moving forward.     Rehab Potential  Good    Clinical Impairments Affecting Rehab Potential  low kinesiophobia, good body awareness.     PT Frequency  2x / week    PT Duration  3 weeks    PT Treatment/Interventions  ADLs/Self Care Home Management;Biofeedback;Cryotherapy;Moist Heat;Gait training;Stair training;Functional mobility training;Therapeutic activities;Therapeutic exercise;Balance training;Neuromuscular re-education;Patient/family education;Manual techniques;Passive range of motion;Energy conservation;Dry needling;Taping    PT Next Visit Plan  continue heavy manual and needling focus; continue hip rotation mobility; glute med/max strength     PT Home Exercise  Plan  11/21: LTRs for hip mobility    Consulted and Agree with Plan of Care  Patient       Patient will  benefit from skilled therapeutic intervention in order to improve the following deficits and impairments:  Abnormal gait, Hypomobility, Decreased knowledge of precautions  Visit Diagnosis: Muscle spasm of back  Cervicalgia  Sacrococcygeal disorders, not elsewhere classified     Problem List Patient Active Problem List   Diagnosis Date Noted  . Hospital discharge follow-up 09/01/2016  . Cough 09/01/2016  . Pneumonia due to Mycoplasma pneumoniae 09/01/2016  . Lobar pneumonia ([place]) 08/27/2016  . Fever, unknown origin 08/26/2016  . Sepsis due to undetermined organism ([place]) 08/25/2016  . Gram negative sepsis ([place]) 08/25/2016  . Bacteremia due to Gram-negative bacteria 08/25/2016  . Syncope 08/25/2016  . Syncope and collapse 08/24/2016  . Sepsis due to pneumonia ([place]) 08/24/2016  . Hyperglycemia 08/24/2016  . Nausea without vomiting 07/02/2016  . Erectile dysfunction 07/02/2016  . SLAP lesion of shoulder 08/04/2012  . Muscle weakness (generalized) 08/04/2012  . Chronic pain 08/31/2011  . Deformity, acquired 08/31/2011  . Essential hypertension 08/31/2011  . Insomnia 08/31/2011  . Epigastric discomfort 05/03/2011  . LUMBAR SPRAIN AND STRAIN 10/22/2009  . CUBITAL TUNNEL SYNDROME 10/24/2007  . SHOULDER PAIN 10/24/2007  . 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 ELBOW PAIN 05/25/2007    Deniece Ree PT, DPT, CBIS  Supplemental Physical Therapist [facility] [address] [place], [place], [address] Phone: [phone]   Fax:  [phone]  Name: Duane [name] MRN: [id number] Date of Birth: 1960/05/06

## 2016-12-10 ENCOUNTER — Ambulatory Visit (HOSPITAL_COMMUNITY): Payer: Medicare Other

## 2016-12-10 DIAGNOSIS — G8929 Other chronic pain: Secondary | ICD-10-CM

## 2016-12-10 DIAGNOSIS — M5442 Lumbago with sciatica, left side: Secondary | ICD-10-CM

## 2016-12-10 DIAGNOSIS — M25522 Pain in left elbow: Secondary | ICD-10-CM | POA: Diagnosis not present

## 2016-12-10 DIAGNOSIS — M62830 Muscle spasm of back: Secondary | ICD-10-CM

## 2016-12-10 DIAGNOSIS — M533 Sacrococcygeal disorders, not elsewhere classified: Secondary | ICD-10-CM

## 2016-12-10 DIAGNOSIS — M542 Cervicalgia: Secondary | ICD-10-CM

## 2016-12-10 NOTE — Therapy (Signed)
[facility] [place], [place], [address] Phone: [phone]   Fax:  [phone]  Physical Therapy Treatment  Patient Details  Name: Duane [name] MRN: [id number] Date of Birth: 03-Aug-1960 Referring Provider: Agustina Caroli, MD    Encounter Date: 12/10/2016  PT End of Session - 12/10/16 1138    Visit Number  8    Number of Visits  12    Date for PT Re-Evaluation  12/30/16    Authorization Type  Medicare     Authorization Time Period  11/18/16-12/30/16    Authorization - Visit Number  8    Authorization - Number of Visits  10    PT Start Time  1104    PT Stop Time  1144    PT Time Calculation (min)  40 min    Activity Tolerance  Patient tolerated treatment well    Behavior During Therapy  [facility] for tasks assessed/performed       Past Medical History:  Diagnosis Date  . Arthritis   . Gout   . Hypertension     Past Surgical History:  Procedure Laterality Date  . ELBOW SURGERY    . FOOT FASCIOTOMY     X 5  . HEMORRHOID SURGERY     X2, Dr. Arnoldo Morale  . NECK EXPLORATION    . SHOULDER ARTHROTOMY     multiple     There were no vitals filed for this visit.  Subjective Assessment - 12/10/16 1106    Subjective  Pt doing well overall, happy with progress made to this point, but reports he continues to have soem pain in th elow back. He is happy that he feels more mobile in general. HEP is going well including new updates.     Currently in Pain?  Yes    Pain Score  4     Pain Location  -- low back    Pain Orientation  Left;Lower    Pain Descriptors / Indicators  Aching    Pain Type  Chronic pain       Intervention this date: 1. Seated in chair forward flexion stretch: tactile cues for symmetrical  flexion, 3x30sec 2. Supine Reverse Curl-up 2x15 3. Dead-bug legs only, 1x10x bilat 4. Clam band bridge, BlueTB, endrange knee flexion, feet wide 2x15 5. Supine Hamstring bridge, feet elevated 24-inches 2x10  6. SLS c  contralateral hip at 90 degrees 2x15 sec 7. SLS on Inverted BOSU ( firm up) 10x5sec each, alternating  8. Deep Squat: 2x10 from 12" surface 9. Hip hikes on 2" step:  2x10 Left, 3x6 Right 10. MFR to QL, paraspinals, Glute med  11. Dry needling to QL, paraspinals, glute med (<2 minutes), twitch elicited, improved tissue length noted.       PT Short Term Goals - 12/08/16 1319      PT SHORT TERM GOAL #2   Title  After 3 weeks patient will report improved tolerance to standing time by 50% to improve ability to work in home and at work.     Baseline  11/28- improving but has not met goal     Time  3    Period  Weeks    Status  On-going      PT SHORT TERM GOAL #3   Title  After three weeks patient will report ability to tolerate driving >[id number] without exacerbation of neck pain.     Baseline  11/28- improving, has come very close to meeting goal  Time  3    Period  Weeks    Status  Partially Met        PT Long Term Goals - 12/08/16 1321      PT LONG TERM GOAL #1   Title  After 6 weeks patient will demonstrate 6MWT >1850fet without increase in low back pain symptoms.     Baseline  11/28- stiffness, not pain     Time  6    Period  Weeks    Status  Achieved      PT LONG TERM GOAL #2   Title  After 6 weeks patient will demonstrate 30sec chair rise test >17 to improve low back strength and funcitonal activity tolerance.     Baseline  11/28- 13     Time  6    Period  Weeks    Status  Achieved      PT LONG TERM GOAL #3   Title  After 6 weeks patient will demonstrate 5/5 testing of left middle trap (prone) and left lower trap (prone) to facilitate improved mechanics of overhead movement.     Baseline  11/28- ongoing     Time  6    Period  Weeks    Status  On-going            Plan - 12/10/16 1139    Clinical Impression Statement  Continued with current program, with progression in hip strength this session, good tolerance overall. Moving to lumbar fleixon bnased  mobility program as well. Pt making good progress in strength and form. Pain levels are stable throuhgout.     Rehab Potential  Good    Clinical Impairments Affecting Rehab Potential  low kinesiophobia, good body awareness.     PT Frequency  2x / week    PT Duration  3 weeks    PT Treatment/Interventions  ADLs/Self Care Home Management;Biofeedback;Cryotherapy;Moist Heat;Gait training;Stair training;Functional mobility training;Therapeutic activities;Therapeutic exercise;Balance training;Neuromuscular re-education;Patient/family education;Manual techniques;Passive range of motion;Energy conservation;Dry needling;Taping    PT Next Visit Plan  continue heavy manual and needling focus; continue hip rotation mobility; glute med/max strength     PT Home Exercise Plan  11/21: LTRs for hip mobility    Consulted and Agree with Plan of Care  Patient       Patient will benefit from skilled therapeutic intervention in order to improve the following deficits and impairments:  Abnormal gait, Hypomobility, Decreased knowledge of precautions  Visit Diagnosis: Muscle spasm of back  Cervicalgia  Sacrococcygeal disorders, not elsewhere classified  Chronic bilateral low back pain with left-sided sciatica     Problem List Patient Active Problem List   Diagnosis Date Noted  . Hospital discharge follow-up 09/01/2016  . Cough 09/01/2016  . Pneumonia due to Mycoplasma pneumoniae 09/01/2016  . Lobar pneumonia ([place] 08/27/2016  . Fever, unknown origin 08/26/2016  . Sepsis due to undetermined organism ([place] 08/25/2016  . Gram negative sepsis ([place] 08/25/2016  . Bacteremia due to Gram-negative bacteria 08/25/2016  . Syncope 08/25/2016  . Syncope and collapse 08/24/2016  . Sepsis due to pneumonia ([place] 08/24/2016  . Hyperglycemia 08/24/2016  . Nausea without vomiting 07/02/2016  . Erectile dysfunction 07/02/2016  . SLAP lesion of shoulder 08/04/2012  . Muscle weakness (generalized) 08/04/2012  .  Chronic pain 08/31/2011  . Deformity, acquired 08/31/2011  . Essential hypertension 08/31/2011  . Insomnia 08/31/2011  . Epigastric discomfort 05/03/2011  . LUMBAR SPRAIN AND STRAIN 10/22/2009  . CUBITAL TUNNEL SYNDROME 10/24/2007  . SHOULDER PAIN 10/24/2007  .  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 ELBOW PAIN 05/25/2007   11:42 AM, 12/10/16 Etta Grandchild, PT, DPT Physical Therapist at [facility] [phone] (office)      Etta Grandchild 12/10/2016, 11:41 AM  [facility] [address] [place], [place], [address] Phone: [phone]   Fax:  [phone]  Name: Duane [name] MRN: [id number] Date of Birth: November 12, 1960

## 2016-12-15 ENCOUNTER — Encounter: Payer: Self-pay | Admitting: Emergency Medicine

## 2016-12-15 ENCOUNTER — Ambulatory Visit (INDEPENDENT_AMBULATORY_CARE_PROVIDER_SITE_OTHER): Payer: Medicare Other | Admitting: Emergency Medicine

## 2016-12-15 ENCOUNTER — Other Ambulatory Visit: Payer: Self-pay

## 2016-12-15 VITALS — BP 106/80 | HR 80 | Temp 98.3°F | Resp 16 | Wt 158.2 lb

## 2016-12-15 DIAGNOSIS — G8929 Other chronic pain: Secondary | ICD-10-CM

## 2016-12-15 DIAGNOSIS — M542 Cervicalgia: Secondary | ICD-10-CM | POA: Diagnosis not present

## 2016-12-15 DIAGNOSIS — M549 Dorsalgia, unspecified: Secondary | ICD-10-CM

## 2016-12-15 DIAGNOSIS — G4700 Insomnia, unspecified: Secondary | ICD-10-CM

## 2016-12-15 MED ORDER — DOXEPIN HCL 6 MG PO TABS: 6.0000 mg | ORAL_TABLET | Freq: Every day | ORAL | 3 refills | Status: DC

## 2016-12-15 NOTE — Progress Notes (Signed)
Duane [name] y.o.   Chief Complaint  Patient presents with  . Medication Refill  . Referral    for back and neck -[facility] [place]: This is a [name] y.o. male needs sleeping pill medication refill; also still waiting on referral I placed for him 11/21. No new symptoms.  HPI   Prior to Admission medications   Medication Sig Start Date End Date Taking? Authorizing Provider  acetaminophen (TYLENOL) 650 MG CR tablet Take 1,300 mg by mouth every 8 (eight) hours as needed for pain.   Yes Provider, Historical, [name]  enalapril (VASOTEC) 10 MG tablet 10 mg tablet; oral every day 10/26/16  Yes Duane [name], Duane Bloomer, [name]  HYDROmorphone (DILAUDID) 4 MG tablet Take 1 tablet (4 mg total) by mouth every 4 (four) hours as needed for severe pain. Do not fill until 11/24/15 04/07/16  Yes Duane [name]  indomethacin (INDOCIN) 50 MG capsule Take 50 mg by mouth 3 (three) times daily as needed.   Yes Provider, Historical, [name]  tadalafil (CIALIS) 20 MG tablet Take 0.5-1 tablets (10-20 mg total) by mouth every other day as needed for erectile dysfunction. 07/02/16  Yes Duane [name], Duane Bloomer, [name]  promethazine (PHENERGAN) 25 MG tablet Take 0.5 tablets (12.5 mg total) by mouth every 6 (six) hours as needed for nausea. 10/26/16 11/25/16  Duane [name]    Allergies  Allergen Reactions  . Codeine   . Escitalopram Other (See Comments)    Patient states it caused night terrors  . Hydrocodone Nausea And Vomiting  . Oxycodone Nausea And Vomiting  . Penicillins Nausea Only    .Has patient had a PCN reaction causing immediate rash, facial/tongue/throat swelling, SOB or lightheadedness with hypotension: No Has patient had a PCN reaction causing severe rash involving mucus membranes or skin necrosis: No  Has patient had a PCN reaction that required hospitalization: No Has patient had a PCN reaction occurring within the last 10 years: No If all of the  above answers are "NO", then may proceed with Cephalosporin use.   . Levofloxacin Rash    Patient Active Problem List   Diagnosis Date Noted  . Hospital discharge follow-up 09/01/2016  . Cough 09/01/2016  . Pneumonia due to Mycoplasma pneumoniae 09/01/2016  . Lobar pneumonia ([place]) 08/27/2016  . Fever, unknown origin 08/26/2016  . Sepsis due to undetermined organism ([place]) 08/25/2016  . Gram negative sepsis ([place]) 08/25/2016  . Bacteremia due to Gram-negative bacteria 08/25/2016  . Syncope 08/25/2016  . Syncope and collapse 08/24/2016  . Sepsis due to pneumonia ([place]) 08/24/2016  . Hyperglycemia 08/24/2016  . Nausea without vomiting 07/02/2016  . Erectile dysfunction 07/02/2016  . SLAP lesion of shoulder 08/04/2012  . Muscle weakness (generalized) 08/04/2012  . Chronic pain 08/31/2011  . Deformity, acquired 08/31/2011  . Essential hypertension 08/31/2011  . Insomnia 08/31/2011  . Epigastric discomfort 05/03/2011  . LUMBAR SPRAIN AND STRAIN 10/22/2009  . CUBITAL TUNNEL SYNDROME 10/24/2007  . SHOULDER PAIN 10/24/2007  . 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 ELBOW PAIN 05/25/2007    Past Medical History:  Diagnosis Date  . Arthritis   . Gout   . Hypertension     Past Surgical History:  Procedure Laterality Date  . ELBOW SURGERY    . FOOT FASCIOTOMY     X 5  . HEMORRHOID SURGERY     X2, Dr. Arnoldo Morale  . NECK EXPLORATION    .  SHOULDER ARTHROTOMY     multiple     Social History   Socioeconomic History  . Marital status: Single    Spouse name: Not on file  . Number of children: Not on file  . Years of education: Not on file  . Highest education level: Not on file  Social Needs  . Financial resource strain: Not on file  . Food insecurity - worry: Not on file  . Food insecurity - inability: Not on file  . Transportation needs - medical: Not on file  . Transportation needs - non-medical: Not on file  Occupational History  . Occupation: Curator: [employer]  Tobacco Use  . Smoking status: Never Smoker  . Smokeless tobacco: Never Used  Substance and Sexual Activity  . Alcohol use: No  . Drug use: No  . Sexual activity: No  Other Topics Concern  . Not on file  Social History Narrative  . Not on file    Family History  Problem Relation Age of Onset  . Diabetes Mother   . COPD Father   . Colon cancer Neg Hx      Review of Systems  Constitutional: Negative for chills and fever.  HENT: Negative for sore throat.   Respiratory: Negative for shortness of breath.   Cardiovascular: Negative for chest pain.  Gastrointestinal: Negative for nausea and vomiting.  Musculoskeletal: Positive for back pain and neck pain.  Neurological: Negative for dizziness and headaches.  Psychiatric/Behavioral: The patient has insomnia.   All other systems reviewed and are negative.   Vitals:   12/15/16 1215  BP: 106/80  Pulse: 80  Resp: 16  Temp: 98.3 F (36.8 C)  SpO2: 97%    Physical Exam  Constitutional: He is oriented to person, place, and time. He appears well-nourished.  HENT:  Head: Normocephalic and atraumatic.  Eyes: Pupils are equal, round, and reactive to light.  Neck: Normal range of motion.  Cardiovascular: Normal rate.  Pulmonary/Chest: Effort normal.  Neurological: He is alert and oriented to person, place, and time.  Skin: Skin is warm and dry.  Psychiatric: He has a normal mood and affect. His behavior is normal.  Vitals reviewed.    ASSESSMENT & PLAN:  [name] was seen today for medication refill and referral.  Diagnoses and all orders for this visit:  Insomnia, unspecified type  Chronic neck and back pain  Other orders -     Doxepin HCl 6 MG TABS; Take 1 tablet (6 mg total) by mouth at bedtime.   Patient Instructions       IF you received an x-ray today, you will receive an invoice from [facility]. Please contact [facility] at [phone] with  questions or concerns regarding your invoice.   IF you received labwork today, you will receive an invoice from [facility]. Please contact [facility] at [phone] with questions or concerns regarding your invoice.   Our billing staff will not be able to assist you with questions regarding bills from these companies.  You will be contacted with the lab results as soon as they are available. The fastest way to get your results is to activate your My Chart account. Instructions are located on the last page of this paperwork. If you have not heard from [place] regarding the results in 2 weeks, please contact this office.    Insomnia Insomnia is a sleep disorder that makes it difficult to fall asleep or to stay asleep. Insomnia can cause  tiredness (fatigue), low energy, difficulty concentrating, mood swings, and poor performance at work or school. There are three different ways to classify insomnia:  Difficulty falling asleep.  Difficulty staying asleep.  Waking up too early in the morning.  Any type of insomnia can be long-term (chronic) or short-term (acute). Both are common. Short-term insomnia usually lasts for three months or less. Chronic insomnia occurs at least three times a week for longer than three months. What are the causes? Insomnia may be caused by another condition, situation, or substance, such as:  Anxiety.  Certain medicines.  Gastroesophageal reflux disease (GERD) or other gastrointestinal conditions.  Asthma or other breathing conditions.  Restless legs syndrome, sleep apnea, or other sleep disorders.  Chronic pain.  Menopause. This may include hot flashes.  Stroke.  Abuse of alcohol, tobacco, or illegal drugs.  Depression.  Caffeine.  Neurological disorders, such as Alzheimer disease.  An overactive thyroid (hyperthyroidism).  The cause of insomnia may not be known. What increases the risk? Risk factors for insomnia include:  Gender. Women are more  commonly affected than men.  Age. Insomnia is more common as you get older.  Stress. This may involve your professional or personal life.  Income. Insomnia is more common in people with lower income.  Lack of exercise.  Irregular work schedule or night shifts.  Traveling between different time zones.  What are the signs or symptoms? If you have insomnia, trouble falling asleep or trouble staying asleep is the main symptom. This may lead to other symptoms, such as:  Feeling fatigued.  Feeling nervous about going to sleep.  Not feeling rested in the morning.  Having trouble concentrating.  Feeling irritable, anxious, or depressed.  How is this treated? Treatment for insomnia depends on the cause. If your insomnia is caused by an underlying condition, treatment will focus on addressing the condition. Treatment may also include:  Medicines to help you sleep.  Counseling or therapy.  Lifestyle adjustments.  Follow these instructions at home:  Take medicines only as directed by your health care provider.  Keep regular sleeping and waking hours. Avoid naps.  Keep a sleep diary to help you and your health care provider figure out what could be causing your insomnia. Include: ? When you sleep. ? When you wake up during the night. ? How well you sleep. ? How rested you feel the next day. ? Any side effects of medicines you are taking. ? What you eat and drink.  Make your bedroom a comfortable place where it is easy to fall asleep: ? Put up shades or special blackout curtains to block light from outside. ? Use a white noise machine to block noise. ? Keep the temperature cool.  Exercise regularly as directed by your health care provider. Avoid exercising right before bedtime.  Use relaxation techniques to manage stress. Ask your health care provider to suggest some techniques that may work well for you. These may include: ? Breathing exercises. ? Routines to release  muscle tension. ? Visualizing peaceful scenes.  Cut back on alcohol, caffeinated beverages, and cigarettes, especially close to bedtime. These can disrupt your sleep.  Do not overeat or eat spicy foods right before bedtime. This can lead to digestive discomfort that can make it hard for you to sleep.  Limit screen use before bedtime. This includes: ? Watching TV. ? Using your smartphone, tablet, and computer.  Stick to a routine. This can help you fall asleep faster. Try to do a quiet activity,  brush your teeth, and go to bed at the same time each night.  Get out of bed if you are still awake after 15 minutes of trying to sleep. Keep the lights down, but try reading or doing a quiet activity. When you feel sleepy, go back to bed.  Make sure that you drive carefully. Avoid driving if you feel very sleepy.  Keep all follow-up appointments as directed by your health care provider. This is important. Contact a health care provider if:  You are tired throughout the day or have trouble in your daily routine due to sleepiness.  You continue to have sleep problems or your sleep problems get worse. Get help right away if:  You have serious thoughts about hurting yourself or someone else. This information is not intended to replace advice given to you by your health care provider. Make sure you discuss any questions you have with your health care provider. Document Released: 12/26/1999 Document Revised: 05/30/2015 Document Reviewed: 09/28/2013 Elsevier Interactive Patient Education  2018 Elsevier Inc.      Agustina Caroli, [name]

## 2016-12-15 NOTE — Patient Instructions (Addendum)
   IF you received an x-ray today, you will receive an invoice from [facility]. Please contact [facility] at [phone] with questions or concerns regarding your invoice.   IF you received labwork today, you will receive an invoice from [facility]. Please contact [facility] at [phone] with questions or concerns regarding your invoice.   Our billing staff will not be able to assist you with questions regarding bills from these companies.  You will be contacted with the lab results as soon as they are available. The fastest way to get your results is to activate your My Chart account. Instructions are located on the last page of this paperwork. If you have not heard from us regarding the results in 2 weeks, please contact this office.    Insomnia Insomnia is a sleep disorder that makes it difficult to fall asleep or to stay asleep. Insomnia can cause tiredness (fatigue), low energy, difficulty concentrating, mood swings, and poor performance at work or school. There are three different ways to classify insomnia:  Difficulty falling asleep.  Difficulty staying asleep.  Waking up too early in the morning.  Any type of insomnia can be long-term (chronic) or short-term (acute). Both are common. Short-term insomnia usually lasts for three months or less. Chronic insomnia occurs at least three times a week for longer than three months. What are the causes? Insomnia may be caused by another condition, situation, or substance, such as:  Anxiety.  Certain medicines.  Gastroesophageal reflux disease (GERD) or other gastrointestinal conditions.  Asthma or other breathing conditions.  Restless legs syndrome, sleep apnea, or other sleep disorders.  Chronic pain.  Menopause. This may include hot flashes.  Stroke.  Abuse of alcohol, tobacco, or illegal drugs.  Depression.  Caffeine.  Neurological disorders, such as Alzheimer disease.  An overactive thyroid  (hyperthyroidism).  The cause of insomnia may not be known. What increases the risk? Risk factors for insomnia include:  Gender. Women are more commonly affected than men.  Age. Insomnia is more common as you get older.  Stress. This may involve your professional or personal life.  Income. Insomnia is more common in people with lower income.  Lack of exercise.  Irregular work schedule or night shifts.  Traveling between different time zones.  What are the signs or symptoms? If you have insomnia, trouble falling asleep or trouble staying asleep is the main symptom. This may lead to other symptoms, such as:  Feeling fatigued.  Feeling nervous about going to sleep.  Not feeling rested in the morning.  Having trouble concentrating.  Feeling irritable, anxious, or depressed.  How is this treated? Treatment for insomnia depends on the cause. If your insomnia is caused by an underlying condition, treatment will focus on addressing the condition. Treatment may also include:  Medicines to help you sleep.  Counseling or therapy.  Lifestyle adjustments.  Follow these instructions at home:  Take medicines only as directed by your health care provider.  Keep regular sleeping and waking hours. Avoid naps.  Keep a sleep diary to help you and your health care provider figure out what could be causing your insomnia. Include: ? When you sleep. ? When you wake up during the night. ? How well you sleep. ? How rested you feel the next day. ? Any side effects of medicines you are taking. ? What you eat and drink.  Make your bedroom a comfortable place where it is easy to fall asleep: ? Put up shades or special blackout   curtains to block light from outside. ? Use a white noise machine to block noise. ? Keep the temperature cool.  Exercise regularly as directed by your health care provider. Avoid exercising right before bedtime.  Use relaxation techniques to manage stress. Ask  your health care provider to suggest some techniques that may work well for you. These may include: ? Breathing exercises. ? Routines to release muscle tension. ? Visualizing peaceful scenes.  Cut back on alcohol, caffeinated beverages, and cigarettes, especially close to bedtime. These can disrupt your sleep.  Do not overeat or eat spicy foods right before bedtime. This can lead to digestive discomfort that can make it hard for you to sleep.  Limit screen use before bedtime. This includes: ? Watching TV. ? Using your smartphone, tablet, and computer.  Stick to a routine. This can help you fall asleep faster. Try to do a quiet activity, brush your teeth, and go to bed at the same time each night.  Get out of bed if you are still awake after 15 minutes of trying to sleep. Keep the lights down, but try reading or doing a quiet activity. When you feel sleepy, go back to bed.  Make sure that you drive carefully. Avoid driving if you feel very sleepy.  Keep all follow-up appointments as directed by your health care provider. This is important. Contact a health care provider if:  You are tired throughout the day or have trouble in your daily routine due to sleepiness.  You continue to have sleep problems or your sleep problems get worse. Get help right away if:  You have serious thoughts about hurting yourself or someone else. This information is not intended to replace advice given to you by your health care provider. Make sure you discuss any questions you have with your health care provider. Document Released: 12/26/1999 Document Revised: 05/30/2015 Document Reviewed: 09/28/2013 Elsevier Interactive Patient Education  2018 Elsevier Inc.  

## 2016-12-17 ENCOUNTER — Ambulatory Visit (HOSPITAL_COMMUNITY): Payer: Medicare Other | Attending: Orthopedic Surgery

## 2016-12-17 DIAGNOSIS — M533 Sacrococcygeal disorders, not elsewhere classified: Secondary | ICD-10-CM | POA: Diagnosis present

## 2016-12-17 DIAGNOSIS — M62830 Muscle spasm of back: Secondary | ICD-10-CM

## 2016-12-17 DIAGNOSIS — G8929 Other chronic pain: Secondary | ICD-10-CM | POA: Insufficient documentation

## 2016-12-17 DIAGNOSIS — M542 Cervicalgia: Secondary | ICD-10-CM | POA: Diagnosis present

## 2016-12-17 DIAGNOSIS — M5442 Lumbago with sciatica, left side: Secondary | ICD-10-CM | POA: Insufficient documentation

## 2016-12-17 NOTE — Therapy (Signed)
[place], [place], [address] Phone: [phone]   Fax:  [phone]  Physical Therapy Treatment  Patient Details  Name: Duane [name] MRN: [id number] Date of Birth: Mar 05, 1960 Referring Provider: Agustina Caroli, MD    Encounter Date: 12/17/2016  PT End of Session - 12/17/16 1409    Visit Number  9    Number of Visits  12    Date for PT Re-Evaluation  12/30/16    Authorization Type  Medicare     Authorization Time Period  11/18/16-12/30/16    Authorization - Visit Number  9    Authorization - Number of Visits  10    PT Start Time  2620    PT Stop Time  1428    PT Time Calculation (min)  40 min    Activity Tolerance  Patient tolerated treatment well    Behavior During Therapy  [facility] for tasks assessed/performed       Past Medical History:  Diagnosis Date  . Arthritis   . Gout   . Hypertension     Past Surgical History:  Procedure Laterality Date  . ELBOW SURGERY    . FOOT FASCIOTOMY     X 5  . HEMORRHOID SURGERY     X2, Dr. Arnoldo Morale  . NECK EXPLORATION    . SHOULDER ARTHROTOMY     multiple     There were no vitals filed for this visit.  Subjective Assessment - 12/17/16 1352    Subjective  Pt reports he continues to do well overall. HEP is going well and stretches are helping. He feels more flexible overall, and he thinks his pain is more consistent and predictable.     Currently in Pain?  Yes    Pain Score  4     Pain Location  Back central L5 area back pain       Intervention this date: 1. Seated in chair forward flexion stretch: tactile cues for symmetrical  flexion, 3x30sec 2. Supine Reverse Curl-up 2x15 3. Dead-bug legs only, 2x8 bilat 4. Clam band bridge, BlueTB, end-range knee flexion, feet wide 2x15 5. SLS c contralateral hip at 90 degrees 5x15 sec, firm surface 6. SLS on Inverted BOSU ( firm up) 5x10sec each, alternating  7. Deep Squat: 2x10 from 12" surface, 10lb dumbbell  8. Hip  hikes on 2" step: 2x10 Left, 2x8 Right   [facility] Adult PT Treatment/Exercise - 12/17/16 0001      Ambulation/Gait   Ambulation Distance (Feet)  900 Feet 3 minutes 28 seconds    Assistive device  None    Gait velocity  1.65ms          PT Short Term Goals - 12/08/16 1319      PT SHORT TERM GOAL #2   Title  After 3 weeks patient will report improved tolerance to standing time by 50% to improve ability to work in home and at work.     Baseline  11/28- improving but has not met goal     Time  3    Period  Weeks    Status  On-going      PT SHORT TERM GOAL #3   Title  After three weeks patient will report ability to tolerate driving >>[id number] exacerbation of neck pain.     Baseline  11/28- improving, has come very close to meeting goal     Time  3    Period  Weeks  Status  Partially Met        PT Long Term Goals - 12/17/16 1357      PT LONG TERM GOAL #1   Title  After 6 weeks patient will demonstrate 6MWT >1848fet without increase in low back pain symptoms.     Baseline  11/28- stiffness, not pain; 1.227m    Time  6    Period  Weeks    Status  On-going      PT LONG TERM GOAL #2   Title  After 6 weeks patient will demonstrate 30sec chair rise test >17 to improve low back strength and funcitonal activity tolerance.     Baseline  11/28- 13; 12/7-11    Time  6    Period  Weeks    Status  On-going      PT LONG TERM GOAL #3   Title  After 6 weeks patient will demonstrate 5/5 testing of left middle trap (prone) and left lower trap (prone) to facilitate improved mechanics of overhead movement.     Baseline  11/28- ongoing     Time  6    Period  Weeks    Status  On-going            Plan - 12/17/16 1419    Clinical Impression Statement  COntinued with flexion based program, emphasis on spine mobility and hip strength. Pt tolerating well. No increases in pain. Making good progress toward goals overall. 30chair rise still nto on track, but AMB distance  improving without exacerbation of pain.     Rehab Potential  Good    Clinical Impairments Affecting Rehab Potential  low kinesiophobia, good body awareness.     PT Frequency  2x / week    PT Duration  3 weeks    PT Treatment/Interventions  ADLs/Self Care Home Management;Biofeedback;Cryotherapy;Moist Heat;Gait training;Stair training;Functional mobility training;Therapeutic activities;Therapeutic exercise;Balance training;Neuromuscular re-education;Patient/family education;Manual techniques;Passive range of motion;Energy conservation;Dry needling;Taping    PT Next Visit Plan  Continue progression of hip and core strength, flexion based program.     PT Home Exercise Plan  11/21: LTRs for hip mobility    Consulted and Agree with Plan of Care  Patient       Patient will benefit from skilled therapeutic intervention in order to improve the following deficits and impairments:  Abnormal gait, Hypomobility, Decreased knowledge of precautions  Visit Diagnosis: Muscle spasm of back  Cervicalgia     Problem List Patient Active Problem List   Diagnosis Date Noted  . Hospital discharge follow-up 09/01/2016  . Cough 09/01/2016  . Pneumonia due to Mycoplasma pneumoniae 09/01/2016  . Lobar pneumonia ([facility]/17/2018  . Fever, unknown origin 08/26/2016  . Sepsis due to undetermined organism ([facility]/15/2018  . Gram negative sepsis ([facility]/15/2018  . Bacteremia due to Gram-negative bacteria 08/25/2016  . Syncope 08/25/2016  . Syncope and collapse 08/24/2016  . Sepsis due to pneumonia ([facility]2018  . Hyperglycemia 08/24/2016  . Nausea without vomiting 07/02/2016  . Erectile dysfunction 07/02/2016  . SLAP lesion of shoulder 08/04/2012  . Muscle weakness (generalized) 08/04/2012  . Chronic neck and back pain 08/31/2011  . Deformity, acquired 08/31/2011  . Essential hypertension 08/31/2011  . Insomnia 08/31/2011  . Epigastric discomfort 05/03/2011  . LUMBAR SPRAIN AND STRAIN 10/22/2009   . CUBITAL TUNNEL SYNDROME 10/24/2007  . SHOULDER PAIN 10/24/2007  . 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 ELBOW PAIN 05/25/2007    [facility] 12/17/2016, 2:24 PM  2:30 PM, 12/17/16 [place]  Kalman Drape, PT, DPT Physical Therapist at [facility] [phone] (office)      [facility] [address] [place], [place], [address] Phone: [phone]   Fax:  [phone]  Name: HA PLACERES MRN: [id number] Date of Birth: 1960/12/05

## 2016-12-20 ENCOUNTER — Ambulatory Visit (HOSPITAL_COMMUNITY): Payer: Medicare Other

## 2016-12-24 ENCOUNTER — Ambulatory Visit (HOSPITAL_COMMUNITY): Payer: Medicare Other

## 2016-12-29 ENCOUNTER — Other Ambulatory Visit: Payer: Self-pay

## 2016-12-29 ENCOUNTER — Ambulatory Visit (HOSPITAL_COMMUNITY): Payer: Medicare Other

## 2016-12-29 DIAGNOSIS — M5442 Lumbago with sciatica, left side: Secondary | ICD-10-CM

## 2016-12-29 DIAGNOSIS — G8929 Other chronic pain: Secondary | ICD-10-CM

## 2016-12-29 DIAGNOSIS — M533 Sacrococcygeal disorders, not elsewhere classified: Secondary | ICD-10-CM

## 2016-12-29 DIAGNOSIS — M542 Cervicalgia: Secondary | ICD-10-CM

## 2016-12-29 DIAGNOSIS — M62830 Muscle spasm of back: Secondary | ICD-10-CM | POA: Diagnosis not present

## 2016-12-29 NOTE — Therapy (Signed)
[facility] [place], [place], [address] Phone: [phone]   Fax:  [phone]  Physical Therapy Treatment/Reassessment  Patient Details  Name: Duane [name] MRN: [id number] Date of Birth: 09/13/1960 Referring Provider: Agustina Caroli, MD    Encounter Date: 12/29/2016  PT End of Session - 12/29/16 1303    Visit Number  10    Number of Visits  18    Date for PT Re-Evaluation  12/30/16    Authorization Type  Medicare (g-codes done on 03-04-22 visit)    Authorization Time Period  11/18/16-12/30/16; New: 12/29/16 to 01/19/17    Authorization - Visit Number  10    Authorization - Number of Visits  18    PT Start Time  1300    PT Stop Time  1339    PT Time Calculation (min)  39 min    Activity Tolerance  Patient tolerated treatment well    Behavior During Therapy  WFL for tasks assessed/performed       Past Medical History:  Diagnosis Date  . Arthritis   . Gout   . Hypertension     Past Surgical History:  Procedure Laterality Date  . ELBOW SURGERY    . FOOT FASCIOTOMY     X 5  . HEMORRHOID SURGERY     X2, Dr. Arnoldo Morale  . NECK EXPLORATION    . SHOULDER ARTHROTOMY     multiple     There were no vitals filed for this visit.  Subjective Assessment - 12/29/16 1301    Subjective  Pt states that he's feeling okay, he is medicated. He reports having some LBP and and neck pain.    Currently in Pain?  Yes    Pain Score  4     Pain Location  Back and neck    Pain Orientation  Lower;Right;Left    Pain Descriptors / Indicators  Aching    Pain Type  Chronic pain    Pain Onset  1 to 4 weeks ago    Pain Frequency  Intermittent    Aggravating Factors   doing any one thing for too long    Pain Relieving Factors  ice, heat, medication    Effect of Pain on Daily Activities  min effect         [facility] PT Assessment - 12/29/16 0001      Strength   Strength Assessment Site  Shoulder    Right/Left Shoulder  Left    Left  Shoulder Flexion  5/5 in prone, Lower trap    Left Shoulder Horizontal ABduction  5/5 in prone, Middle trap      Ambulation/Gait   Ambulation Distance (Feet)  1684 Feet 6MWT    Assistive device  None    Gait velocity  1.37ms      Standardized Balance Assessment   Standardized Balance Assessment  Five Times Sit to Stand    Five times sit to stand comments   30 sec chair rise: 16x           [facility] Adult PT Treatment/Exercise - 12/29/16 0001      Lumbar Exercises: Stretches   Hip Flexor Stretch Limitations  seated lumbar flexion stretch 2x30"      Lumbar Exercises: Standing   Other Standing Lumbar Exercises  SLS with vectors on foam x3RT each    Other Standing Lumbar Exercises  in-line half-kneeling lifts and chops with LUE 2x15 reps with RTB each  PT Education - 12/29/16 1325    Education provided  Yes    Education Details  reassessment findings    Person(s) Educated  Patient    Methods  Explanation    Comprehension  Verbalized understanding       PT Short Term Goals - 12/29/16 1304      PT SHORT TERM GOAL #2   Title  After 3 weeks patient will report improved tolerance to standing time by 50% to improve ability to work in home and at work.     Baseline  12/19: 15 mins    Time  3    Period  Weeks    Status  Achieved      PT SHORT TERM GOAL #3   Title  After three weeks patient will report ability to tolerate driving >49IYMEBRA without exacerbation of neck pain.     Baseline  12/19: can drive 30 mins without neck pain, back pain limits him    Time  3    Period  Weeks    Status  Partially Met        PT Long Term Goals - 12/29/16 1305      PT LONG TERM GOAL #1   Title  After 6 weeks patient will demonstrate 6MWT >1866fet without increase in low back pain symptoms.     Baseline  12/19: 16863f 1.5 m/s; minimal hip pain, no LBP    Time  6    Period  Weeks    Status  Partially Met      PT LONG TERM GOAL #2   Title  After 6 weeks patient will  demonstrate 30sec chair rise test >17 to improve low back strength and funcitonal activity tolerance.     Baseline  12/19; 16x    Time  6    Period  Weeks    Status  On-going      PT LONG TERM GOAL #3   Title  After 6 weeks patient will demonstrate 5/5 testing of left middle trap (prone) and left lower trap (prone) to facilitate improved mechanics of overhead movement.     Baseline  12/19: 5/5 lower and middle trap    Time  6    Period  Weeks    Status  Achieved            Plan - 12/29/16 1339    Clinical Impression Statement  PT reassessed pt's goals and outcome measures this date. Pt has made good progress towards goals as illustrated above. His functional strength in BLE and BUE have improved AEB MMT and chair rise test. Overall, pt feels he has improved 50% stating that he is standing up taller, he has more strength, and he can walk and sit longer. He states that his remaining limiting factors is that he is not able to walk or stand nor does he have as much muscle endurance as he'd like to have. Recommend brief extension of PT services to continue current POC as pt is progressing nicely. Rest of session focused on glute and core strengthening without any c/o pain. He reports his pain was slightly improved from the beginning of session.     Rehab Potential  Good    Clinical Impairments Affecting Rehab Potential  low kinesiophobia, good body awareness.     PT Frequency  2x / week    PT Duration  3 weeks    PT Treatment/Interventions  ADLs/Self Care Home Management;Biofeedback;Cryotherapy;Moist Heat;Gait training;Stair training;Functional mobility training;Therapeutic activities;Therapeutic exercise;Balance training;Neuromuscular  re-education;Patient/family education;Manual techniques;Passive range of motion;Energy conservation;Dry needling;Taping    PT Next Visit Plan  Continue progression of hip and core strength, flexion based program.     PT Home Exercise Plan  11/21: LTRs for hip  mobility    Consulted and Agree with Plan of Care  Patient       Patient will benefit from skilled therapeutic intervention in order to improve the following deficits and impairments:  Abnormal gait, Hypomobility, Decreased knowledge of precautions  Visit Diagnosis: Muscle spasm of back  Cervicalgia  Sacrococcygeal disorders, not elsewhere classified  Chronic bilateral low back pain with left-sided sciatica   G-Codes - 01/24/2017 1339    Functional Assessment Tool Used (Outpatient Only)  Clinical Judgment     Functional Limitation  Changing and maintaining body position    Changing and Maintaining Body Position Current Status (X6580)  At least 20 percent but less than 40 percent impaired, limited or restricted    Changing and Maintaining Body Position Goal Status (I6349)  At least 1 percent but less than 20 percent impaired, limited or restricted       Problem List Patient Active Problem List   Diagnosis Date Noted  . Hospital discharge follow-up 09/01/2016  . Cough 09/01/2016  . Pneumonia due to Mycoplasma pneumoniae 09/01/2016  . Lobar pneumonia ([place]) 08/27/2016  . Fever, unknown origin 08/26/2016  . Sepsis due to undetermined organism ([place]) 08/25/2016  . Gram negative sepsis ([place]) 08/25/2016  . Bacteremia due to Gram-negative bacteria 08/25/2016  . Syncope 08/25/2016  . Syncope and collapse 08/24/2016  . Sepsis due to pneumonia ([place]) 08/24/2016  . Hyperglycemia 08/24/2016  . Nausea without vomiting 07/02/2016  . Erectile dysfunction 07/02/2016  . SLAP lesion of shoulder 08/04/2012  . Muscle weakness (generalized) 08/04/2012  . Chronic neck and back pain 08/31/2011  . Deformity, acquired 08/31/2011  . Essential hypertension 08/31/2011  . Insomnia 08/31/2011  . Epigastric discomfort 05/03/2011  . LUMBAR SPRAIN AND STRAIN 10/22/2009  . CUBITAL TUNNEL SYNDROME 10/24/2007  . SHOULDER PAIN 10/24/2007  . 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 ELBOW PAIN 05/25/2007       Duane [name] PT, DPT  [facility] [address] [place], [place], [address] Phone: [phone]   Fax:  [phone]  Name: Duane [name] MRN: [id number] Date of Birth: 08-Jun-1960

## 2017-01-05 ENCOUNTER — Telehealth (HOSPITAL_COMMUNITY): Payer: Self-pay | Admitting: Physical Therapy

## 2017-01-05 ENCOUNTER — Ambulatory Visit (HOSPITAL_COMMUNITY): Payer: Medicare Other | Admitting: Physical Therapy

## 2017-01-05 DIAGNOSIS — M542 Cervicalgia: Secondary | ICD-10-CM

## 2017-01-05 DIAGNOSIS — M62830 Muscle spasm of back: Secondary | ICD-10-CM

## 2017-01-05 DIAGNOSIS — M533 Sacrococcygeal disorders, not elsewhere classified: Secondary | ICD-10-CM

## 2017-01-05 NOTE — Therapy (Signed)
[facility], [place], [address] Phone: [phone]   Fax:  [phone]  Physical Therapy Treatment  Patient Details  Name: Duane [name] MRN: [id number] Date of Birth: [birth year] Referring Provider: Agustina Caroli, MD    Encounter Date: 01/05/2017  PT End of Session - 01/05/17 1643    Visit Number  11    Number of Visits  18    Date for PT Re-Evaluation  12/30/16    Authorization Type  Medicare (g-codes done on February 26, 2022 visit)    Authorization Time Period  11/18/16-12/30/16; New: 12/29/16 to 01/19/17    Authorization - Visit Number  11    Authorization - Number of Visits  18    PT Start Time  1975    PT Stop Time  8832    PT Time Calculation (min)  40 min    Activity Tolerance  Patient tolerated treatment well    Behavior During Therapy  WFL for tasks assessed/performed       Past Medical History:  Diagnosis Date  . Arthritis   . Gout   . Hypertension     Past Surgical History:  Procedure Laterality Date  . ELBOW SURGERY    . FOOT FASCIOTOMY     X 5  . HEMORRHOID SURGERY     X2, Dr. Arnoldo Morale  . NECK EXPLORATION    . SHOULDER ARTHROTOMY     multiple     There were no vitals filed for this visit.  Subjective Assessment - 01/05/17 1606    Subjective  Pt states he returned to the neurosurgeon today and they want to do a repeat MRI and possible series of shots.  Currently 5/10 on Rt lumbar region.    Currently in Pain?  Yes    Pain Score  5     Pain Location  Back    Pain Orientation  Right;Lower    Pain Descriptors / Indicators  Aching                      [facility] Adult PT Treatment/Exercise - 01/05/17 0001      Lumbar Exercises: Stretches   Hip Flexor Stretch Limitations  seated lumbar flexion stretch 2x30"      Lumbar Exercises: Standing   Functional Squats  10 reps;Limitations    Functional Squats Limitations  10 squats on BOSU (dome down), 10 squats no UE to 12" surface     Forward Lunge  10 reps;Limitations    Forward Lunge Limitations  BOSU dome up no UE    Side Lunge  10 reps;Limitations    Side Lunge Limitations  BOSU dome up    Other Standing Lumbar Exercises  SLS with vectors on foam x5RT each with 15 second holds    Other Standing Lumbar Exercises  hip hikes 30 reps each      Lumbar Exercises: Supine   Bridge  15 reps;Limitations    Bridge Limitations  2 sets with BTB clams then bridge up/down    Other Supine Lumbar Exercises  reverse curl ups 2X15 reps      Lumbar Exercises: Prone   Straight Leg Raise  15 reps;Limitations    Straight Leg Raises Limitations  2 sets               PT Short Term Goals - 12/29/16 1304      PT SHORT TERM GOAL #2   Title  After 3 weeks patient will report  improved tolerance to standing time by 50% to improve ability to work in home and at work.     Baseline  12/19: 15 mins    Time  3    Period  Weeks    Status  Achieved      PT SHORT TERM GOAL #3   Title  After three weeks patient will report ability to tolerate driving >69CVELFYB without exacerbation of neck pain.     Baseline  12/19: can drive 30 mins without neck pain, back pain limits him    Time  3    Period  Weeks    Status  Partially Met        PT Long Term Goals - 12/29/16 1305      PT LONG TERM GOAL #1   Title  After 6 weeks patient will demonstrate 6MWT >1837fet without increase in low back pain symptoms.     Baseline  12/19: 16857f 1.5 m/s; minimal hip pain, no LBP    Time  6    Period  Weeks    Status  Partially Met      PT LONG TERM GOAL #2   Title  After 6 weeks patient will demonstrate 30sec chair rise test >17 to improve low back strength and funcitonal activity tolerance.     Baseline  12/19; 16x    Time  6    Period  Weeks    Status  On-going      PT LONG TERM GOAL #3   Title  After 6 weeks patient will demonstrate 5/5 testing of left middle trap (prone) and left lower trap (prone) to facilitate improved mechanics of  overhead movement.     Baseline  12/19: 5/5 lower and middle trap    Time  6    Period  Weeks    Status  Achieved            Plan - 01/05/17 1645    Clinical Impression Statement  Continued with emphasis on core and glute strength.  Added vectors with 15"holds, squats,  lateral and forward lunges onto BOSU and increased reps of most other established exercises without difficulty . Pt did require breathing cues when completeing reverse curl ups as tends to hold breath.  Side steps also added with BTB to challenge hip abductors and glutes.      Rehab Potential  Good    Clinical Impairments Affecting Rehab Potential  low kinesiophobia, good body awareness.     PT Frequency  2x / week    PT Duration  3 weeks    PT Treatment/Interventions  ADLs/Self Care Home Management;Biofeedback;Cryotherapy;Moist Heat;Gait training;Stair training;Functional mobility training;Therapeutic activities;Therapeutic exercise;Balance training;Neuromuscular re-education;Patient/family education;Manual techniques;Passive range of motion;Energy conservation;Dry needling;Taping    PT Next Visit Plan  Continue progression of hip and core strength, flexion based program.     PT Home Exercise Plan  11/21: LTRs for hip mobility    Consulted and Agree with Plan of Care  Patient       Patient will benefit from skilled therapeutic intervention in order to improve the following deficits and impairments:  Abnormal gait, Hypomobility, Decreased knowledge of precautions  Visit Diagnosis: Muscle spasm of back  Cervicalgia  Sacrococcygeal disorders, not elsewhere classified     Problem List Patient Active Problem List   Diagnosis Date Noted  . Hospital discharge follow-up 09/01/2016  . Cough 09/01/2016  . Pneumonia due to Mycoplasma pneumoniae 09/01/2016  . Lobar pneumonia ([place]/17/2018  . Fever, unknown origin  08/26/2016  . Sepsis due to undetermined organism ([place]) 08/25/2016  . Gram negative sepsis ([place])  08/25/2016  . Bacteremia due to Gram-negative bacteria 08/25/2016  . Syncope 08/25/2016  . Syncope and collapse 08/24/2016  . Sepsis due to pneumonia ([place]) 08/24/2016  . Hyperglycemia 08/24/2016  . Nausea without vomiting 07/02/2016  . Erectile dysfunction 07/02/2016  . SLAP lesion of shoulder 08/04/2012  . Muscle weakness (generalized) 08/04/2012  . Chronic neck and back pain 08/31/2011  . Deformity, acquired 08/31/2011  . Essential hypertension 08/31/2011  . Insomnia 08/31/2011  . Epigastric discomfort 05/03/2011  . LUMBAR SPRAIN AND STRAIN 10/22/2009  . CUBITAL TUNNEL SYNDROME 10/24/2007  . SHOULDER PAIN 10/24/2007  . 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 ELBOW PAIN 05/25/2007   Teena Irani, PTA/CLT [phone]  Teena Irani 01/05/2017, 4:47 PM  [facility] [address] [place], [place], [address] Phone: [phone]   Fax:  [phone]  Name: Duane [name] MRN: [id number] Date of Birth: 03-Feb-1960

## 2017-01-05 NOTE — Telephone Encounter (Signed)
L/m offered earlier apptment for today.

## 2017-01-07 ENCOUNTER — Telehealth (HOSPITAL_COMMUNITY): Payer: Self-pay

## 2017-01-07 ENCOUNTER — Ambulatory Visit (HOSPITAL_COMMUNITY): Payer: Medicare Other

## 2017-01-07 NOTE — Telephone Encounter (Signed)
No show, called and spoke to pt who stated he had forgotten about apt.  Pt reminded next apt date and time and stated he would be there.  [address] [place]; [facility] [phone]

## 2017-01-10 ENCOUNTER — Other Ambulatory Visit (HOSPITAL_COMMUNITY): Payer: Self-pay | Admitting: Nurse Practitioner

## 2017-01-10 DIAGNOSIS — M47812 Spondylosis without myelopathy or radiculopathy, cervical region: Secondary | ICD-10-CM

## 2017-01-13 ENCOUNTER — Ambulatory Visit (HOSPITAL_COMMUNITY): Payer: Medicare Other | Attending: Orthopedic Surgery | Admitting: Physical Therapy

## 2017-01-13 ENCOUNTER — Encounter (HOSPITAL_COMMUNITY): Payer: Self-pay | Admitting: Physical Therapy

## 2017-01-13 DIAGNOSIS — M62830 Muscle spasm of back: Secondary | ICD-10-CM | POA: Insufficient documentation

## 2017-01-13 DIAGNOSIS — G8929 Other chronic pain: Secondary | ICD-10-CM | POA: Insufficient documentation

## 2017-01-13 DIAGNOSIS — M542 Cervicalgia: Secondary | ICD-10-CM | POA: Diagnosis present

## 2017-01-13 DIAGNOSIS — M5442 Lumbago with sciatica, left side: Secondary | ICD-10-CM | POA: Diagnosis present

## 2017-01-13 NOTE — Therapy (Signed)
[facility] [place], [place], [address] Phone: [phone]   Fax:  [phone]  Physical Therapy Treatment  Patient Details  Name: Duane [name] MRN: [id number] Date of Birth: 06-Nov-1960 Referring Provider: Agustina Caroli, MD    Encounter Date: 01/13/2017  PT End of Session - 01/13/17 1606    Visit Number  12    Number of Visits  18    Date for PT Re-Evaluation  12/30/16    Authorization Type  Medicare (g-codes done on 02-24-2022 visit)    Authorization Time Period  11/18/16-12/30/16; New: 12/29/16 to 01/19/17    Authorization - Visit Number  12    Authorization - Number of Visits  18    PT Start Time  3606    PT Stop Time  7703    PT Time Calculation (min)  46 min    Activity Tolerance  Patient tolerated treatment well    Behavior During Therapy  WFL for tasks assessed/performed       Past Medical History:  Diagnosis Date  . Arthritis   . Gout   . Hypertension     Past Surgical History:  Procedure Laterality Date  . ELBOW SURGERY    . FOOT FASCIOTOMY     X 5  . HEMORRHOID SURGERY     X2, Dr. Arnoldo Morale  . NECK EXPLORATION    . SHOULDER ARTHROTOMY     multiple     There were no vitals filed for this visit.  Subjective Assessment - 01/13/17 1516    Subjective  PT states that his neck and back are about the same.  Lt side of neck and B lowback.     How long can you sit comfortably?  11/28- 15 minutes before he has to shift now 30     How long can you stand comfortably?  11/28- 10 minutes before leaning;  now 20 minutes.    How long can you walk comfortably?  11/28- 1/4 mile ; now 30 minutes     Diagnostic tests  MRI shows no specific/acute cause of back pain/radiculopathy     Patient Stated Goals  reduce pain     Currently in Pain?  Yes    Pain Score  4  Pt is medicated    Pain Location  Back    Pain Orientation  Lower    Pain Descriptors / Indicators  Pressure    Pain Type  Chronic pain    Pain Onset  1 to 4  weeks ago    Pain Frequency  Intermittent    Multiple Pain Sites  Yes    Pain Score  5    Pain Location  Neck    Pain Orientation  Left    Pain Descriptors / Indicators  Aching    Pain Type  Chronic pain    Pain Radiating Towards  shoulder    Pain Frequency  Constant                      [facility] Adult PT Treatment/Exercise - 01/13/17 0001      Exercises   Exercises  Neck;Lumbar      Neck Exercises: Seated   Other Seated Exercise  3 D cervical excursion x 3       Lumbar Exercises: Standing   Wall Slides  10 reps    Other Standing Lumbar Exercises  SLS with vectors on foam x5RT each with 15 second holds  Lumbar Exercises: Supine   Bridge  15 reps;Limitations    Bridge Limitations  10 second hold     Straight Leg Raise  10 reps    Other Supine Lumbar Exercises  reverse curl up x 15;  decompression exercise ; t band decompression exercises     Other Supine Lumbar Exercises  LAQ/ hip flexion on lg balll       Lumbar Exercises: Sidelying   Clam  15 reps    Clam Limitations  blue tband       Lumbar Exercises: Prone   Straight Leg Raise  20 reps               PT Short Term Goals - 12/29/16 1304      PT SHORT TERM GOAL #2   Title  After 3 weeks patient will report improved tolerance to standing time by 50% to improve ability to work in home and at work.     Baseline  12/19: 15 mins    Time  3    Period  Weeks    Status  Achieved      PT SHORT TERM GOAL #3   Title  After three weeks patient will report ability to tolerate driving >34KBTCYEL without exacerbation of neck pain.     Baseline  12/19: can drive 30 mins without neck pain, back pain limits him    Time  3    Period  Weeks    Status  Partially Met        PT Long Term Goals - 12/29/16 1305      PT LONG TERM GOAL #1   Title  After 6 weeks patient will demonstrate 6MWT >1884fet without increase in low back pain symptoms.     Baseline  12/19: 1683f 1.5 m/s; minimal hip pain, no LBP     Time  6    Period  Weeks    Status  Partially Met      PT LONG TERM GOAL #2   Title  After 6 weeks patient will demonstrate 30sec chair rise test >17 to improve low back strength and funcitonal activity tolerance.     Baseline  12/19; 16x    Time  6    Period  Weeks    Status  On-going      PT LONG TERM GOAL #3   Title  After 6 weeks patient will demonstrate 5/5 testing of left middle trap (prone) and left lower trap (prone) to facilitate improved mechanics of overhead movement.     Baseline  12/19: 5/5 lower and middle trap    Time  6    Period  Weeks    Status  Achieved            Plan - 01/13/17 1607    Clinical Impression Statement  Pt continues to have pain although he states that he can tell that the therapy is slowly improving his sx.  Added cervical excursion exercises with noted improvement of motin at the end of repetitions.  Pt instructed and given decompression exercises for low back .      Rehab Potential  Good    Clinical Impairments Affecting Rehab Potential  low kinesiophobia, good body awareness.     PT Frequency  2x / week    PT Duration  3 weeks    PT Treatment/Interventions  ADLs/Self Care Home Management;Biofeedback;Cryotherapy;Moist Heat;Gait training;Stair training;Functional mobility training;Therapeutic activities;Therapeutic exercise;Balance training;Neuromuscular re-education;Patient/family education;Manual techniques;Passive range of motion;Energy conservation;Dry needling;Taping  PT Next Visit Plan  Continue progression of hip and core strength, flexion based program.     PT Home Exercise Plan  11/21: LTRs for hip mobility    Consulted and Agree with Plan of Care  Patient       Patient will benefit from skilled therapeutic intervention in order to improve the following deficits and impairments:  Abnormal gait, Hypomobility, Decreased knowledge of precautions  Visit Diagnosis: Muscle spasm of back  Cervicalgia     Problem List Patient  Active Problem List   Diagnosis Date Noted  . Hospital discharge follow-up 09/01/2016  . Cough 09/01/2016  . Pneumonia due to Mycoplasma pneumoniae 09/01/2016  . Lobar pneumonia ([place]) 08/27/2016  . Fever, unknown origin 08/26/2016  . Sepsis due to undetermined organism ([place]) 08/25/2016  . Gram negative sepsis ([place]) 08/25/2016  . Bacteremia due to Gram-negative bacteria 08/25/2016  . Syncope 08/25/2016  . Syncope and collapse 08/24/2016  . Sepsis due to pneumonia ([place]) 08/24/2016  . Hyperglycemia 08/24/2016  . Nausea without vomiting 07/02/2016  . Erectile dysfunction 07/02/2016  . SLAP lesion of shoulder 08/04/2012  . Muscle weakness (generalized) 08/04/2012  . Chronic neck and back pain 08/31/2011  . Deformity, acquired 08/31/2011  . Essential hypertension 08/31/2011  . Insomnia 08/31/2011  . Epigastric discomfort 05/03/2011  . LUMBAR SPRAIN AND STRAIN 10/22/2009  . CUBITAL TUNNEL SYNDROME 10/24/2007  . SHOULDER PAIN 10/24/2007  . 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 ELBOW PAIN 05/25/2007    Rayetta Humphrey, PT CLT [phone] 01/13/2017, 4:10 PM  [facility] [address] [place], [place], [address] Phone: [phone]   Fax:  [phone]  Name: Duane [name] MRN: [id number] Date of Birth: 1960/09/05

## 2017-01-14 ENCOUNTER — Telehealth (HOSPITAL_COMMUNITY): Payer: Self-pay | Admitting: Emergency Medicine

## 2017-01-14 ENCOUNTER — Ambulatory Visit (HOSPITAL_COMMUNITY): Payer: Medicare Other

## 2017-01-14 ENCOUNTER — Ambulatory Visit (HOSPITAL_COMMUNITY)
Admission: RE | Admit: 2017-01-14 | Discharge: 2017-01-14 | Disposition: A | Discharge status: Home / Self Care | Payer: Medicare Other | Source: Ambulatory Visit | Attending: Nurse Practitioner | Admitting: Nurse Practitioner

## 2017-01-14 DIAGNOSIS — M47812 Spondylosis without myelopathy or radiculopathy, cervical region: Secondary | ICD-10-CM | POA: Diagnosis not present

## 2017-01-14 DIAGNOSIS — M48061 Spinal stenosis, lumbar region without neurogenic claudication: Secondary | ICD-10-CM | POA: Diagnosis not present

## 2017-01-14 NOTE — Telephone Encounter (Signed)
01/14/17  pt called to cx just said he wouldn't be able to come to therapy today

## 2017-01-18 ENCOUNTER — Encounter (HOSPITAL_COMMUNITY): Payer: Self-pay | Admitting: Physical Therapy

## 2017-01-18 ENCOUNTER — Ambulatory Visit (HOSPITAL_COMMUNITY): Payer: Medicare Other | Admitting: Physical Therapy

## 2017-01-18 DIAGNOSIS — M5442 Lumbago with sciatica, left side: Secondary | ICD-10-CM

## 2017-01-18 DIAGNOSIS — M62830 Muscle spasm of back: Secondary | ICD-10-CM | POA: Diagnosis not present

## 2017-01-18 DIAGNOSIS — M542 Cervicalgia: Secondary | ICD-10-CM

## 2017-01-18 DIAGNOSIS — G8929 Other chronic pain: Secondary | ICD-10-CM

## 2017-01-18 NOTE — Therapy (Signed)
[facility] [place], [place], [address] Phone: [phone]   Fax:  [phone]  Physical Therapy Treatment  Patient Details  Name: Duane [name] MRN: [id number] Date of Birth: [birth year] Referring Provider: Agustina Caroli, MD    Encounter Date: 01/18/2017  PT End of Session - 01/18/17 0835    Visit Number  13    Number of Visits  18    Date for PT Re-Evaluation  12/30/16    Authorization Type  Medicare (g-codes done on 04-10-22 visit)    Authorization Time Period  11/18/16-12/30/16; New: 12/29/16 to 01/19/17    Authorization - Visit Number  13    Authorization - Number of Visits  18    PT Start Time  0818    PT Stop Time  0900    PT Time Calculation (min)  42 min    Activity Tolerance  Patient tolerated treatment well    Behavior During Therapy  WFL for tasks assessed/performed       Past Medical History:  Diagnosis Date  . Arthritis   . Gout   . Hypertension     Past Surgical History:  Procedure Laterality Date  . ELBOW SURGERY    . FOOT FASCIOTOMY     X 5  . HEMORRHOID SURGERY     X2, Dr. Arnoldo Morale  . NECK EXPLORATION    . SHOULDER ARTHROTOMY     multiple     There were no vitals filed for this visit.  Subjective Assessment - 01/18/17 0816    Subjective  PT states that his neck and back is feeling about the same.   PT is doing his exercises on a daily basis.     How long can you sit comfortably?  11/28- 15 minutes before he has to shift now 30     How long can you stand comfortably?  11/28- 10 minutes before leaning;  now 20 minutes.    How long can you walk comfortably?  11/28- 1/4 mile ; now 30 minutes     Diagnostic tests  MRI shows no specific/acute cause of back pain/radiculopathy     Patient Stated Goals  reduce pain     Currently in Pain?  Yes    Pain Score  4     Pain Location  Back    Pain Orientation  Lower;Left    Pain Descriptors / Indicators  Aching    Pain Radiating Towards  glut LT     Pain  Onset  1 to 4 weeks ago    Pain Frequency  Intermittent    Pain Score  3    Pain Location  Neck    Pain Orientation  Lower    Pain Descriptors / Indicators  Aching    Pain Type  Chronic pain    Pain Frequency  Intermittent                      [facility] Adult PT Treatment/Exercise - 01/18/17 0840      Exercises   Exercises  Neck;Lumbar      Neck Exercises: Standing   Other Standing Exercises  cervical excursion x 3       Neck Exercises: Seated   Other Seated Exercise  3 D cervical excursion x 3       Lumbar Exercises: Stretches   Standing Extension  -- 3 D hip excursion x 3       Lumbar Exercises: Standing  Heel Raises  15 reps    Functional Squats Limitations  15 regular / 15 on bosu     Forward Lunge  10 reps;Limitations    Forward Lunge Limitations  BOSU dome up no UE    Wall Slides  10 reps    Other Standing Lumbar Exercises  SLS with vectors on foam x5RT each with 15 second holds    Other Standing Lumbar Exercises  hip extension; sidestep with blue t band       Lumbar Exercises: Seated   Sit to Stand  10 reps    Sit to Stand Limitations  both with rT and Lt in front ; split stance       Manual Therapy   Manual Therapy  Soft tissue mobilization;Muscle Energy Technique    Manual therapy comments  seperate from all other aspects of treatment     Soft tissue mobilization  decrease spasm     Muscle Energy Technique  correct SI                PT Short Term Goals - 12/29/16 1304      PT SHORT TERM GOAL #2   Title  After 3 weeks patient will report improved tolerance to standing time by 50% to improve ability to work in home and at work.     Baseline  12/19: 15 mins    Time  3    Period  Weeks    Status  Achieved      PT SHORT TERM GOAL #3   Title  After three weeks patient will report ability to tolerate driving >[id number] without exacerbation of neck pain.     Baseline  12/19: can drive 30 mins without neck pain, back pain limits him    Time   3    Period  Weeks    Status  Partially Met        PT Long Term Goals - 12/29/16 1305      PT LONG TERM GOAL #1   Title  After 6 weeks patient will demonstrate 6MWT >1830fet without increase in low back pain symptoms.     Baseline  12/19: 16873f 1.5 m/s; minimal hip pain, no LBP    Time  6    Period  Weeks    Status  Partially Met      PT LONG TERM GOAL #2   Title  After 6 weeks patient will demonstrate 30sec chair rise test >17 to improve low back strength and funcitonal activity tolerance.     Baseline  12/19; 16x    Time  6    Period  Weeks    Status  On-going      PT LONG TERM GOAL #3   Title  After 6 weeks patient will demonstrate 5/5 testing of left middle trap (prone) and left lower trap (prone) to facilitate improved mechanics of overhead movement.     Baseline  12/19: 5/5 lower and middle trap    Time  6    Period  Weeks    Status  Achieved            Plan - 01/18/17 1051    Clinical Impression Statement  Pt has noted hip height difference and Lt paraspinal mm spasm when standing.  Therapist checked SI which was misaligned.  Used mm energy techniques to realign with pt verbaizing improved comfort at end of session.      Rehab Potential  Good    Clinical Impairments  Affecting Rehab Potential  low kinesiophobia, good body awareness.     PT Frequency  2x / week    PT Duration  3 weeks    PT Treatment/Interventions  ADLs/Self Care Home Management;Biofeedback;Cryotherapy;Moist Heat;Gait training;Stair training;Functional mobility training;Therapeutic activities;Therapeutic exercise;Balance training;Neuromuscular re-education;Patient/family education;Manual techniques;Passive range of motion;Energy conservation;Dry needling;Taping    PT Next Visit Plan  Check SI and possible manual to cervical area as well secondary to pt pain not improving.     PT Home Exercise Plan  11/21: LTRs for hip mobility    Consulted and Agree with Plan of Care  Patient       Patient  will benefit from skilled therapeutic intervention in order to improve the following deficits and impairments:  Abnormal gait, Hypomobility, Decreased knowledge of precautions  Visit Diagnosis: Muscle spasm of back  Cervicalgia  Chronic bilateral low back pain with left-sided sciatica     Problem List Patient Active Problem List   Diagnosis Date Noted  . Hospital discharge follow-up 09/01/2016  . Cough 09/01/2016  . Pneumonia due to Mycoplasma pneumoniae 09/01/2016  . Lobar pneumonia ([place]) 08/27/2016  . Fever, unknown origin 08/26/2016  . Sepsis due to undetermined organism ([facility] [place]) 08/25/2016  . Gram negative sepsis ([place]) 08/25/2016  . Bacteremia due to Gram-negative bacteria 08/25/2016  . Syncope 08/25/2016  . Syncope and collapse 08/24/2016  . Sepsis due to pneumonia ([place]) 08/24/2016  . Hyperglycemia 08/24/2016  . Nausea without vomiting 07/02/2016  . Erectile dysfunction 07/02/2016  . SLAP lesion of shoulder 08/04/2012  . Muscle weakness (generalized) 08/04/2012  . Chronic neck and back pain 08/31/2011  . Deformity, acquired 08/31/2011  . Essential hypertension 08/31/2011  . Insomnia 08/31/2011  . Epigastric discomfort 05/03/2011  . LUMBAR SPRAIN AND STRAIN 10/22/2009  . CUBITAL TUNNEL SYNDROME 10/24/2007  . SHOULDER PAIN 10/24/2007  . 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 ELBOW PAIN 05/25/2007    Rayetta Humphrey, PT CLT [phone] 01/18/2017, 10:55 AM  [facility] [address] [place], [place], [address] Phone: [phone]   Fax:  [phone]  Name: MICHAELPAUL APO MRN: [id number] Date of Birth: [birth year]

## 2017-01-20 NOTE — Addendum Note (Signed)
Addended by: Geralyn Corwin on: 01/20/2017 05:09 PM   Modules accepted: Orders

## 2017-01-21 ENCOUNTER — Telehealth (HOSPITAL_COMMUNITY): Payer: Self-pay | Admitting: Emergency Medicine

## 2017-01-21 ENCOUNTER — Ambulatory Visit (HOSPITAL_COMMUNITY): Payer: Medicare Other

## 2017-01-21 NOTE — Telephone Encounter (Signed)
01/21/17  pt called and said that he wouldn't be coming to therapy today

## 2017-06-03 ENCOUNTER — Other Ambulatory Visit: Payer: Self-pay

## 2017-06-03 ENCOUNTER — Encounter: Payer: Self-pay | Admitting: Emergency Medicine

## 2017-06-03 ENCOUNTER — Ambulatory Visit (INDEPENDENT_AMBULATORY_CARE_PROVIDER_SITE_OTHER): Payer: Medicare Other | Admitting: Emergency Medicine

## 2017-06-03 VITALS — BP 144/98 | HR 84 | Temp 98.2°F | Resp 18 | Ht 70.16 in | Wt 168.6 lb

## 2017-06-03 DIAGNOSIS — I10 Essential (primary) hypertension: Secondary | ICD-10-CM

## 2017-06-03 DIAGNOSIS — R110 Nausea: Secondary | ICD-10-CM | POA: Diagnosis not present

## 2017-06-03 DIAGNOSIS — Z8739 Personal history of other diseases of the musculoskeletal system and connective tissue: Secondary | ICD-10-CM

## 2017-06-03 MED ORDER — PROMETHAZINE HCL 25 MG PO TABS: 12.5000 mg | ORAL_TABLET | Freq: Four times a day (QID) | ORAL | 3 refills | Status: DC | PRN

## 2017-06-03 MED ORDER — INDOMETHACIN 50 MG PO CAPS: 50.0000 mg | ORAL_CAPSULE | Freq: Three times a day (TID) | ORAL | 3 refills | Status: DC | PRN

## 2017-06-03 MED ORDER — ENALAPRIL MALEATE 10 MG PO TABS: ORAL_TABLET | ORAL | 3 refills | Status: DC

## 2017-06-03 NOTE — Patient Instructions (Addendum)
   IF you received an x-ray today, you will receive an invoice from [facility]. Please contact [facility] at [phone] with questions or concerns regarding your invoice.   IF you received labwork today, you will receive an invoice from [facility]. Please contact [facility] at [phone] with questions or concerns regarding your invoice.   Our billing staff will not be able to assist you with questions regarding bills from these companies.  You will be contacted with the lab results as soon as they are available. The fastest way to get your results is to activate your My Chart account. Instructions are located on the last page of this paperwork. If you have not heard from us regarding the results in 2 weeks, please contact this office.     Hypertension Hypertension, commonly called high blood pressure, is when the force of blood pumping through the arteries is too strong. The arteries are the blood vessels that carry blood from the heart throughout the body. Hypertension forces the heart to work harder to pump blood and may cause arteries to become narrow or stiff. Having untreated or uncontrolled hypertension can cause heart attacks, strokes, kidney disease, and other problems. A blood pressure reading consists of a higher number over a lower number. Ideally, your blood pressure should be below 120/80. The first ("top") number is called the systolic pressure. It is a measure of the pressure in your arteries as your heart beats. The second ("bottom") number is called the diastolic pressure. It is a measure of the pressure in your arteries as the heart relaxes. What are the causes? The cause of this condition is not known. What increases the risk? Some risk factors for high blood pressure are under your control. Others are not. Factors you can change  Smoking.  Having type 2 diabetes mellitus, high cholesterol, or both.  Not getting enough exercise or physical  activity.  Being overweight.  Having too much fat, sugar, calories, or salt (sodium) in your diet.  Drinking too much alcohol. Factors that are difficult or impossible to change  Having chronic kidney disease.  Having a family history of high blood pressure.  Age. Risk increases with age.  Race. You may be at higher risk if you are African-American.  Gender. Men are at higher risk than women before age 45. After age 65, women are at higher risk than men.  Having obstructive sleep apnea.  Stress. What are the signs or symptoms? Extremely high blood pressure (hypertensive crisis) may cause:  Headache.  Anxiety.  Shortness of breath.  Nosebleed.  Nausea and vomiting.  Severe chest pain.  Jerky movements you cannot control (seizures).  How is this diagnosed? This condition is diagnosed by measuring your blood pressure while you are seated, with your arm resting on a surface. The cuff of the blood pressure monitor will be placed directly against the skin of your upper arm at the level of your heart. It should be measured at least twice using the same arm. Certain conditions can cause a difference in blood pressure between your right and left arms. Certain factors can cause blood pressure readings to be lower or higher than normal (elevated) for a short period of time:  When your blood pressure is higher when you are in a health care provider's office than when you are at home, this is called white coat hypertension. Most people with this condition do not need medicines.  When your blood pressure is higher at home than when you   are in a health care provider's office, this is called masked hypertension. Most people with this condition may need medicines to control blood pressure.  If you have a high blood pressure reading during one visit or you have normal blood pressure with other risk factors:  You may be asked to return on a different day to have your blood pressure  checked again.  You may be asked to monitor your blood pressure at home for 1 week or longer.  If you are diagnosed with hypertension, you may have other blood or imaging tests to help your health care provider understand your overall risk for other conditions. How is this treated? This condition is treated by making healthy lifestyle changes, such as eating healthy foods, exercising more, and reducing your alcohol intake. Your health care provider may prescribe medicine if lifestyle changes are not enough to get your blood pressure under control, and if:  Your systolic blood pressure is above 130.  Your diastolic blood pressure is above 80.  Your personal target blood pressure may vary depending on your medical conditions, your age, and other factors. Follow these instructions at home: Eating and drinking  Eat a diet that is high in fiber and potassium, and low in sodium, added sugar, and fat. An example eating plan is called the DASH (Dietary Approaches to Stop Hypertension) diet. To eat this way: ? Eat plenty of fresh fruits and vegetables. Try to fill half of your plate at each meal with fruits and vegetables. ? Eat whole grains, such as whole wheat pasta, brown rice, or whole grain bread. Fill about one quarter of your plate with whole grains. ? Eat or drink low-fat dairy products, such as skim milk or low-fat yogurt. ? Avoid fatty cuts of meat, processed or cured meats, and poultry with skin. Fill about one quarter of your plate with lean proteins, such as fish, chicken without skin, beans, eggs, and tofu. ? Avoid premade and processed foods. These tend to be higher in sodium, added sugar, and fat.  Reduce your daily sodium intake. Most people with hypertension should eat less than 1,500 mg of sodium a day.  Limit alcohol intake to no more than 1 drink a day for nonpregnant women and 2 drinks a day for men. One drink equals 12 oz of beer, 5 oz of wine, or 1 oz of hard  liquor. Lifestyle  Work with your health care provider to maintain a healthy body weight or to lose weight. Ask what an ideal weight is for you.  Get at least 30 minutes of exercise that causes your heart to beat faster (aerobic exercise) most days of the week. Activities may include walking, swimming, or biking.  Include exercise to strengthen your muscles (resistance exercise), such as pilates or lifting weights, as part of your weekly exercise routine. Try to do these types of exercises for 30 minutes at least 3 days a week.  Do not use any products that contain nicotine or tobacco, such as cigarettes and e-cigarettes. If you need help quitting, ask your health care provider.  Monitor your blood pressure at home as told by your health care provider.  Keep all follow-up visits as told by your health care provider. This is important. Medicines  Take over-the-counter and prescription medicines only as told by your health care provider. Follow directions carefully. Blood pressure medicines must be taken as prescribed.  Do not skip doses of blood pressure medicine. Doing this puts you at risk for problems and   can make the medicine less effective.  Ask your health care provider about side effects or reactions to medicines that you should watch for. Contact a health care provider if:  You think you are having a reaction to a medicine you are taking.  You have headaches that keep coming back (recurring).  You feel dizzy.  You have swelling in your ankles.  You have trouble with your vision. Get help right away if:  You develop a severe headache or confusion.  You have unusual weakness or numbness.  You feel faint.  You have severe pain in your chest or abdomen.  You vomit repeatedly.  You have trouble breathing. Summary  Hypertension is when the force of blood pumping through your arteries is too strong. If this condition is not controlled, it may put you at risk for serious  complications.  Your personal target blood pressure may vary depending on your medical conditions, your age, and other factors. For most people, a normal blood pressure is less than 120/80.  Hypertension is treated with lifestyle changes, medicines, or a combination of both. Lifestyle changes include weight loss, eating a healthy, low-sodium diet, exercising more, and limiting alcohol. This information is not intended to replace advice given to you by your health care provider. Make sure you discuss any questions you have with your health care provider. Document Released: 12/28/2004 Document Revised: 11/26/2015 Document Reviewed: 11/26/2015 [employer] Interactive Patient Education  2018 [employer]  

## 2017-06-03 NOTE — Progress Notes (Signed)
Duane [name] 57 y.o.   Chief Complaint  Patient presents with  . Hypertension    f/u  . Medication Refill    Vascotec, Indocin, phenergan    HISTORY OF PRESENT ILLNESS: This is a 57 y.o. male here for hypertension follow-up and medication refills.  Has been on Vasotec for over 10 years.  Takes 10 mg a day.  Also has a history of gout for which he occasionally takes Indocin as needed.  Has a history of chronic pain syndrome and recently was restarted on chronic narcotic therapy at the [facility].  Hydromorphone makes him nauseous so he takes Phenergan as needed.  HPI   Prior to Admission medications   Medication Sig Start Date End Date Taking? Authorizing Provider  acetaminophen (TYLENOL) 650 MG CR tablet Take 1,300 mg by mouth every 8 (eight) hours as needed for pain.   Yes [provider]  Doxepin HCl 6 MG TABS Take 1 tablet (6 mg total) by mouth at bedtime. 12/15/16  Yes [name], MD  enalapril (VASOTEC) 10 MG tablet 10 mg tablet; oral every day 10/26/16  Yes Geraldin Habermehl, [name], MD  HYDROmorphone (DILAUDID) 4 MG tablet Take 1 tablet (4 mg total) by mouth every 4 (four) hours as needed for severe pain. Do not fill until 11/24/15 04/07/16  Yes [name], MD  indomethacin (INDOCIN) 50 MG capsule Take 50 mg by mouth 3 (three) times daily as needed.   Yes [provider]  tadalafil (CIALIS) 20 MG tablet Take 0.5-1 tablets (10-20 mg total) by mouth every other day as needed for erectile dysfunction. 07/02/16  Yes Cameryn Chrisley, [name], MD  promethazine (PHENERGAN) 25 MG tablet Take 0.5 tablets (12.5 mg total) by mouth every 6 (six) hours as needed for nausea. 10/26/16 11/25/16  [name], MD    Allergies  Allergen Reactions  . Codeine   . Escitalopram Other (See Comments)    Patient states it caused night terrors  . Hydrocodone Nausea And Vomiting  . Oxycodone Nausea And Vomiting  . Penicillins Nausea Only    .Has patient had a PCN  reaction causing immediate rash, facial/tongue/throat swelling, SOB or lightheadedness with hypotension: No Has patient had a PCN reaction causing severe rash involving mucus membranes or skin necrosis: No  Has patient had a PCN reaction that required hospitalization: No Has patient had a PCN reaction occurring within the last 10 years: No If all of the above answers are "NO", then may proceed with Cephalosporin use.   . Levofloxacin Rash    Patient Active Problem List   Diagnosis Date Noted  . Hospital discharge follow-up 09/01/2016  . Cough 09/01/2016  . Pneumonia due to Mycoplasma pneumoniae 09/01/2016  . Lobar pneumonia ([place]) 08/27/2016  . Fever, unknown origin 08/26/2016  . Sepsis due to undetermined organism ([place]) 08/25/2016  . Gram negative sepsis ([place]) 08/25/2016  . Bacteremia due to Gram-negative bacteria 08/25/2016  . Syncope 08/25/2016  . Syncope and collapse 08/24/2016  . Sepsis due to pneumonia ([place]) 08/24/2016  . Hyperglycemia 08/24/2016  . Nausea without vomiting 07/02/2016  . Erectile dysfunction 07/02/2016  . SLAP lesion of shoulder 08/04/2012  . Muscle weakness (generalized) 08/04/2012  . Chronic neck and back pain 08/31/2011  . Deformity, acquired 08/31/2011  . Essential hypertension 08/31/2011  . Insomnia 08/31/2011  . Epigastric discomfort 05/03/2011  . LUMBAR SPRAIN AND STRAIN 10/22/2009  . CUBITAL TUNNEL SYNDROME 10/24/2007  . SHOULDER PAIN 10/24/2007  . OTHER ENTHESOPATHY OF KNEE  07/31/2007  . ELBOW PAIN 05/25/2007    Past Medical History:  Diagnosis Date  . Arthritis   . Gout   . Hypertension     Past Surgical History:  Procedure Laterality Date  . ELBOW SURGERY    . FOOT FASCIOTOMY     X 5  . HEMORRHOID SURGERY     X2, Dr. Arnoldo Morale  . NECK EXPLORATION    . SHOULDER ARTHROTOMY     multiple     Social History   Socioeconomic History  . Marital status: Single    Spouse name: Not on file  . Number of children: 0  . Years of  education: Not on file  . Highest education level: Not on file  Occupational History  . Occupation: Chemical engineer: [employer]  Social Needs  . Financial resource strain: Not on file  . Food insecurity:    Worry: Not on file    Inability: Not on file  . Transportation needs:    Medical: Not on file    Non-medical: Not on file  Tobacco Use  . Smoking status: Never Smoker  . Smokeless tobacco: Never Used  Substance and Sexual Activity  . Alcohol use: No  . Drug use: No  . Sexual activity: Not Currently  Lifestyle  . Physical activity:    Days per week: Not on file    Minutes per session: Not on file  . Stress: Not on file  Relationships  . Social connections:    Talks on phone: Not on file    Gets together: Not on file    Attends religious service: Not on file    Active member of club or organization: Not on file    Attends meetings of clubs or organizations: Not on file    Relationship status: Not on file  . Intimate partner violence:    Fear of current or ex partner: Not on file    Emotionally abused: Not on file    Physically abused: Not on file    Forced sexual activity: Not on file  Other Topics Concern  . Not on file  Social History Narrative  . Not on file    Family History  Problem Relation Age of Onset  . Diabetes Mother   . COPD Father   . Colon cancer Neg Hx      Review of Systems  Constitutional: Negative.  Negative for chills and fever.  HENT: Negative.  Negative for sore throat.   Eyes: Negative.  Negative for blurred vision and double vision.  Respiratory: Negative.  Negative for cough and shortness of breath.   Cardiovascular: Negative.  Negative for chest pain and palpitations.  Gastrointestinal: Positive for nausea. Negative for abdominal pain and vomiting.  Musculoskeletal: Positive for back pain and joint pain.  Skin: Negative.   Neurological: Negative.  Negative for dizziness and headaches.  Endo/Heme/Allergies:  Negative.   All other systems reviewed and are negative.  Vitals:   06/03/17 1729 06/03/17 1803  BP: (!) 152/94 (!) 144/98  Pulse: 84   Resp: 18   Temp: 98.2 F (36.8 C)   SpO2: 95%      Physical Exam  Constitutional: He is oriented to person, place, and time. He appears well-developed and well-nourished.  HENT:  Head: Normocephalic and atraumatic.  Eyes: Pupils are equal, round, and reactive to light. Conjunctivae and EOM are normal.  Neck: Normal range of motion. Neck supple.  Cardiovascular: Normal rate, regular rhythm  and normal heart sounds.  Pulmonary/Chest: Effort normal and breath sounds normal.  Abdominal: Soft. There is no tenderness.  Neurological: He is alert and oriented to person, place, and time. No sensory deficit. He exhibits normal muscle tone.  Skin: Skin is warm and dry. Capillary refill takes less than 2 seconds.  Psychiatric: He has a normal mood and affect. His behavior is normal.  Vitals reviewed.   A total of 25 minutes was spent in the room with the patient, greater than 50% of which was in counseling/coordination of care regarding chronic medical conditions, hypertension, medications and side effects, and need for follow-up.  ASSESSMENT & PLAN: Connelly was seen today for hypertension and medication refill.  Diagnoses and all orders for this visit:  History of gout -     indomethacin (INDOCIN) 50 MG capsule; Take 1 capsule (50 mg total) by mouth 3 (three) times daily as needed.  Essential hypertension -     enalapril (VASOTEC) 10 MG tablet; 10 mg tablet; oral every day  Nausea without vomiting -     promethazine (PHENERGAN) 25 MG tablet; Take 0.5 tablets (12.5 mg total) by mouth every 6 (six) hours as needed for nausea.    Patient Instructions       IF you received an x-ray today, you will receive an invoice from [facility]. Please contact [facility] at [phone] with questions or concerns regarding your invoice.    IF you received labwork today, you will receive an invoice from [facility]. Please contact [facility] at [phone] with questions or concerns regarding your invoice.   Our billing staff will not be able to assist you with questions regarding bills from these companies.  You will be contacted with the lab results as soon as they are available. The fastest way to get your results is to activate your My Chart account. Instructions are located on the last page of this paperwork. If you have not heard from [place] regarding the results in 2 weeks, please contact this office.     Hypertension Hypertension, commonly called high blood pressure, is when the force of blood pumping through the arteries is too strong. The arteries are the blood vessels that carry blood from the heart throughout the body. Hypertension forces the heart to work harder to pump blood and may cause arteries to become narrow or stiff. Having untreated or uncontrolled hypertension can cause heart attacks, strokes, kidney disease, and other problems. A blood pressure reading consists of a higher number over a lower number. Ideally, your blood pressure should be below 120/80. The first ("top") number is called the systolic pressure. It is a measure of the pressure in your arteries as your heart beats. The second ("bottom") number is called the diastolic pressure. It is a measure of the pressure in your arteries as the heart relaxes. What are the causes? The cause of this condition is not known. What increases the risk? Some risk factors for high blood pressure are under your control. Others are not. Factors you can change  Smoking.  Having type 2 diabetes mellitus, high cholesterol, or both.  Not getting enough exercise or physical activity.  Being overweight.  Having too much fat, sugar, calories, or salt (sodium) in your diet.  Drinking too much alcohol. Factors that are difficult or impossible to change  Having chronic  kidney disease.  Having a family history of high blood pressure.  Age. Risk increases with age.  Race. You may be at higher risk if you are  African-American.  Gender. Men are at higher risk than women before age 50. After age 67, women are at higher risk than men.  Having obstructive sleep apnea.  Stress. What are the signs or symptoms? Extremely high blood pressure (hypertensive crisis) may cause:  Headache.  Anxiety.  Shortness of breath.  Nosebleed.  Nausea and vomiting.  Severe chest pain.  Jerky movements you cannot control (seizures).  How is this diagnosed? This condition is diagnosed by measuring your blood pressure while you are seated, with your arm resting on a surface. The cuff of the blood pressure monitor will be placed directly against the skin of your upper arm at the level of your heart. It should be measured at least twice using the same arm. Certain conditions can cause a difference in blood pressure between your right and left arms. Certain factors can cause blood pressure readings to be lower or higher than normal (elevated) for a short period of time:  When your blood pressure is higher when you are in a health care provider's office than when you are at home, this is called white coat hypertension. Most people with this condition do not need medicines.  When your blood pressure is higher at home than when you are in a health care provider's office, this is called masked hypertension. Most people with this condition may need medicines to control blood pressure.  If you have a high blood pressure reading during one visit or you have normal blood pressure with other risk factors:  You may be asked to return on a different day to have your blood pressure checked again.  You may be asked to monitor your blood pressure at home for 1 week or longer.  If you are diagnosed with hypertension, you may have other blood or imaging tests to help your health care  provider understand your overall risk for other conditions. How is this treated? This condition is treated by making healthy lifestyle changes, such as eating healthy foods, exercising more, and reducing your alcohol intake. Your health care provider may prescribe medicine if lifestyle changes are not enough to get your blood pressure under control, and if:  Your systolic blood pressure is above 130.  Your diastolic blood pressure is above 80.  Your personal target blood pressure may vary depending on your medical conditions, your age, and other factors. Follow these instructions at home: Eating and drinking  Eat a diet that is high in fiber and potassium, and low in sodium, added sugar, and fat. An example eating plan is called the DASH (Dietary Approaches to Stop Hypertension) diet. To eat this way: ? Eat plenty of fresh fruits and vegetables. Try to fill half of your plate at each meal with fruits and vegetables. ? Eat whole grains, such as whole wheat pasta, brown rice, or whole grain bread. Fill about one quarter of your plate with whole grains. ? Eat or drink low-fat dairy products, such as skim milk or low-fat yogurt. ? Avoid fatty cuts of meat, processed or cured meats, and poultry with skin. Fill about one quarter of your plate with lean proteins, such as fish, chicken without skin, beans, eggs, and tofu. ? Avoid premade and processed foods. These tend to be higher in sodium, added sugar, and fat.  Reduce your daily sodium intake. Most people with hypertension should eat less than 1,500 mg of sodium a day.  Limit alcohol intake to no more than 1 drink a day for nonpregnant women and 2 drinks  a day for men. One drink equals 12 oz of beer, 5 oz of wine, or 1 oz of hard liquor. Lifestyle  Work with your health care provider to maintain a healthy body weight or to lose weight. Ask what an ideal weight is for you.  Get at least 30 minutes of exercise that causes your heart to beat  faster (aerobic exercise) most days of the week. Activities may include walking, swimming, or biking.  Include exercise to strengthen your muscles (resistance exercise), such as pilates or lifting weights, as part of your weekly exercise routine. Try to do these types of exercises for 30 minutes at least 3 days a week.  Do not use any products that contain nicotine or tobacco, such as cigarettes and e-cigarettes. If you need help quitting, ask your health care provider.  Monitor your blood pressure at home as told by your health care provider.  Keep all follow-up visits as told by your health care provider. This is important. Medicines  Take over-the-counter and prescription medicines only as told by your health care provider. Follow directions carefully. Blood pressure medicines must be taken as prescribed.  Do not skip doses of blood pressure medicine. Doing this puts you at risk for problems and can make the medicine less effective.  Ask your health care provider about side effects or reactions to medicines that you should watch for. Contact a health care provider if:  You think you are having a reaction to a medicine you are taking.  You have headaches that keep coming back (recurring).  You feel dizzy.  You have swelling in your ankles.  You have trouble with your vision. Get help right away if:  You develop a severe headache or confusion.  You have unusual weakness or numbness.  You feel faint.  You have severe pain in your chest or abdomen.  You vomit repeatedly.  You have trouble breathing. Summary  Hypertension is when the force of blood pumping through your arteries is too strong. If this condition is not controlled, it may put you at risk for serious complications.  Your personal target blood pressure may vary depending on your medical conditions, your age, and other factors. For most people, a normal blood pressure is less than 120/80.  Hypertension is  treated with lifestyle changes, medicines, or a combination of both. Lifestyle changes include weight loss, eating a healthy, low-sodium diet, exercising more, and limiting alcohol. This information is not intended to replace advice given to you by your health care provider. Make sure you discuss any questions you have with your health care provider. Document Released: 12/28/2004 Document Revised: 11/26/2015 Document Reviewed: 11/26/2015 Elsevier Interactive Patient Education  2018 Elsevier Inc.      Agustina Caroli, MD [facility]

## 2017-06-21 ENCOUNTER — Other Ambulatory Visit: Payer: Self-pay | Admitting: Emergency Medicine

## 2017-06-22 NOTE — Telephone Encounter (Signed)
Patient requesting depression medication doxepin. Please advise, thank you.

## 2017-06-22 NOTE — Telephone Encounter (Signed)
doxepin refill Last Refill:12/15/16 # 60 1 RF Last OV: 12/15/16 PCP: Dr. [name] Pharmacy:[facility]

## 2017-09-22 ENCOUNTER — Encounter (HOSPITAL_COMMUNITY): Payer: Self-pay | Admitting: Physical Therapy

## 2017-09-22 NOTE — Therapy (Signed)
[place], [place], [address] Phone: [phone]   Fax:  [phone]  Patient Details  Name: Duane [name] MRN: [id number] Date of Birth: 19-Apr-1960 Referring Provider:  Agustina Caroli  Encounter Date: 09/22/2017 PHYSICAL THERAPY DISCHARGE SUMMARY  Visits from Start of Care: 13  Current functional level related to goals / functional outcomes: Pain was unchanged at last visit but pt did not show for further visits   Remaining deficits: Pt stopped coming to therapy; unknown deficits besides pain    Education / Equipment: HPE Plan: Patient agrees to discharge.  Patient goals were partially met. Patient is being discharged due to not returning since the last visit.  ?????       Rayetta Humphrey, PT CLT [phone] 09/22/2017, 9:27 AM  [facility] [address] [place], [place], [address] Phone: [phone]   Fax:  [phone]

## 2017-10-29 ENCOUNTER — Other Ambulatory Visit: Payer: Self-pay

## 2017-10-29 ENCOUNTER — Ambulatory Visit (HOSPITAL_COMMUNITY)
Admission: EM | Admit: 2017-10-29 | Discharge: 2017-10-29 | Disposition: A | Discharge status: Home / Self Care | Payer: Medicare Other | Attending: Internal Medicine | Admitting: Internal Medicine

## 2017-10-29 ENCOUNTER — Encounter (HOSPITAL_COMMUNITY): Payer: Self-pay | Admitting: Emergency Medicine

## 2017-10-29 DIAGNOSIS — R110 Nausea: Secondary | ICD-10-CM

## 2017-10-29 DIAGNOSIS — Z733 Stress, not elsewhere classified: Secondary | ICD-10-CM | POA: Diagnosis not present

## 2017-10-29 DIAGNOSIS — Z760 Encounter for issue of repeat prescription: Secondary | ICD-10-CM

## 2017-10-29 MED ORDER — ALPRAZOLAM 1 MG PO TABS: 1.0000 mg | ORAL_TABLET | Freq: Every evening | ORAL | 0 refills | Status: DC | PRN

## 2017-10-29 MED ORDER — PROMETHAZINE HCL 25 MG PO TABS: 12.5000 mg | ORAL_TABLET | Freq: Four times a day (QID) | ORAL | 3 refills | Status: DC | PRN

## 2017-10-29 NOTE — ED Triage Notes (Signed)
Requesting medication refill

## 2017-10-29 NOTE — ED Provider Notes (Signed)
[facility]    CSN: [id number] Arrival date & time: 10/29/17  1606     History   Chief Complaint Chief Complaint  Patient presents with  . Medication Refill    HPI Duane [name] is a 57 y.o. male.   Patient presents for medication refill. Patient states that he has lost is PMD and in the process of getting a new one. Patient states that he is being seen by pain clinic. Patient is requesting phenergan and xanax 1 mg. Xanax not listed under patient's current medication. Patient states that he was on it over 1 year ago and due to the stress of his job he would like xanax     Past Medical History:  Diagnosis Date  . Arthritis   . Gout   . Hypertension     Patient Active Problem List   Diagnosis Date Noted  . Hospital discharge follow-up 09/01/2016  . Cough 09/01/2016  . Pneumonia due to Mycoplasma pneumoniae 09/01/2016  . Lobar pneumonia ([place]) 08/27/2016  . Fever, unknown origin 08/26/2016  . Sepsis due to undetermined organism ([place]) 08/25/2016  . Gram negative sepsis ([place]) 08/25/2016  . Bacteremia due to Gram-negative bacteria 08/25/2016  . Syncope 08/25/2016  . Syncope and collapse 08/24/2016  . Sepsis due to pneumonia ([place]) 08/24/2016  . Hyperglycemia 08/24/2016  . Nausea without vomiting 07/02/2016  . Erectile dysfunction 07/02/2016  . SLAP lesion of shoulder 08/04/2012  . Muscle weakness (generalized) 08/04/2012  . Chronic neck and back pain 08/31/2011  . Deformity, acquired 08/31/2011  . Essential hypertension 08/31/2011  . Insomnia 08/31/2011  . Epigastric discomfort 05/03/2011  . LUMBAR SPRAIN AND STRAIN 10/22/2009  . CUBITAL TUNNEL SYNDROME 10/24/2007  . SHOULDER PAIN 10/24/2007  . 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 ELBOW PAIN 05/25/2007    Past Surgical History:  Procedure Laterality Date  . ELBOW SURGERY    . FOOT FASCIOTOMY     X 5  . HEMORRHOID SURGERY     X2, Dr. Arnoldo Morale  . NECK EXPLORATION    . SHOULDER ARTHROTOMY     multiple        Home Medications    Prior to Admission medications   Medication Sig Start Date End Date Taking? Authorizing Provider  acetaminophen (TYLENOL) 650 MG CR tablet Take 1,300 mg by mouth every 8 (eight) hours as needed for pain.    [provider]  ALPRAZolam Duanne Moron) 1 MG tablet Take 1 tablet (1 mg total) by mouth at bedtime as needed for up to 7 days for anxiety. 10/29/17 11/05/17  Jacqualine Mau, NP  doxepin (SINEQUAN) 10 MG capsule TAKE (1) CAPSULE BY MOUTH AT BEDTIME AS NEEDED. 06/22/17   [name], MD  Doxepin HCl 6 MG TABS Take 1 tablet (6 mg total) by mouth at bedtime. 12/15/16   [name], MD  enalapril (VASOTEC) 10 MG tablet 10 mg tablet; oral every day 06/03/17   [name], MD  HYDROmorphone (DILAUDID) 4 MG tablet Take 1 tablet (4 mg total) by mouth every 4 (four) hours as needed for severe pain. Do not fill until 11/24/15 04/07/16   [name], MD  indomethacin (INDOCIN) 50 MG capsule Take 1 capsule (50 mg total) by mouth 3 (three) times daily as needed. 06/03/17   [name], MD  promethazine (PHENERGAN) 25 MG tablet Take 0.5 tablets (12.5 mg total) by mouth every 6 (six) hours as needed for nausea. 10/29/17 11/28/17  Jacqualine Mau,  NP  tadalafil (CIALIS) 20 MG tablet Take 0.5-1 tablets (10-20 mg total) by mouth every other day as needed for erectile dysfunction. 07/02/16   [name], MD    Family History Family History  Problem Relation Age of Onset  . Diabetes Mother   . COPD Father   . Colon cancer Neg Hx     Social History Social History   Tobacco Use  . Smoking status: Never Smoker  . Smokeless tobacco: Never Used  Substance Use Topics  . Alcohol use: No  . Drug use: No     Allergies   Codeine; Escitalopram; Hydrocodone; Oxycodone; Penicillins; and Levofloxacin   Review of Systems Review of Systems  Constitutional: Negative for chills and fever.  HENT:  Negative for ear pain and sore throat.   Eyes: Negative for pain and visual disturbance.  Respiratory: Negative for cough and shortness of breath.   Cardiovascular: Negative for chest pain and palpitations.  Gastrointestinal: Negative for abdominal pain and vomiting.  Genitourinary: Negative for dysuria and hematuria.  Musculoskeletal: Negative for arthralgias and back pain.  Skin: Negative for color change and rash.  Neurological: Negative for seizures and syncope.  All other systems reviewed and are negative.    Physical Exam Triage Vital Signs ED Triage Vitals  Enc Vitals Group     BP 10/29/17 1702 135/89     Pulse Rate 10/29/17 1702 84     Resp 10/29/17 1702 16     Temp 10/29/17 1702 98.4 F (36.9 C)     Temp Source 10/29/17 1702 Oral     SpO2 10/29/17 1702 100 %     Weight --      Height --      Head Circumference --      Peak Flow --      Pain Score 10/29/17 1652 0     Pain Loc --      Pain Edu? --      Excl. in [place]? --    No data found.  Updated Vital Signs BP 135/89 (BP Location: Left Arm)   Pulse 84   Temp 98.4 F (36.9 C) (Oral)   Resp 16   SpO2 100%   Visual Acuity Right Eye Distance:   Left Eye Distance:   Bilateral Distance:    Right Eye Near:   Left Eye Near:    Bilateral Near:     Physical Exam  Constitutional: He is oriented to person, place, and time. He appears well-developed and well-nourished.  HENT:  Head: Normocephalic.  Neck: Normal range of motion.  Pulmonary/Chest: Effort normal.  Musculoskeletal: Normal range of motion.  Neurological: He is alert and oriented to person, place, and time.  Skin: Skin is dry.  Psychiatric: He has a normal mood and affect.  Nursing note and vitals reviewed.    UC Treatments / Results  Labs (all labs ordered are listed, but only abnormal results are displayed) Labs Reviewed - No data to display  EKG None  Radiology No results found.  Procedures Procedures (including critical care  time)  Medications Ordered in UC Medications - No data to display  Initial Impression / Assessment and Plan / UC Course  I have reviewed the triage vital signs and the nursing notes.  Pertinent labs & imaging results that were available during my care of the patient were reviewed by me and considered in my medical decision making (see chart for details).      Final Clinical Impressions(s) / UC Diagnoses  Final diagnoses:  Nausea without vomiting   Discharge Instructions   None    ED Prescriptions    Medication Sig Dispense Auth. Provider   promethazine (PHENERGAN) 25 MG tablet Take 0.5 tablets (12.5 mg total) by mouth every 6 (six) hours as needed for nausea. 60 tablet Jacqualine Mau, NP   ALPRAZolam (XANAX) 1 MG tablet Take 1 tablet (1 mg total) by mouth at bedtime as needed for up to 7 days for anxiety. 14 tablet Jacqualine Mau, NP     Controlled Substance Prescriptions [place] Controlled Substance Registry consulted? Not Applicable   Jacqualine Mau, NP 10/29/17 1725

## 2017-11-02 ENCOUNTER — Ambulatory Visit: Payer: Medicare Other | Admitting: Physician Assistant

## 2017-12-01 ENCOUNTER — Encounter: Payer: Self-pay | Admitting: Emergency Medicine

## 2017-12-01 ENCOUNTER — Ambulatory Visit (INDEPENDENT_AMBULATORY_CARE_PROVIDER_SITE_OTHER): Payer: Medicare Other | Admitting: Emergency Medicine

## 2017-12-01 ENCOUNTER — Other Ambulatory Visit: Payer: Self-pay

## 2017-12-01 VITALS — BP 155/102 | HR 81 | Temp 98.0°F | Resp 16 | Wt 164.0 lb

## 2017-12-01 DIAGNOSIS — G894 Chronic pain syndrome: Secondary | ICD-10-CM

## 2017-12-01 NOTE — Progress Notes (Signed)
Duane [name] 57 y.o.   Chief Complaint  Patient presents with  . Medication Refill    XANAX per patient Dr Case prescribed and advise to refill with PCP    HISTORY OF PRESENT ILLNESS: This is a 57 y.o. male here requesting refill on Xanax prescribed by orthopedic surgeon Dr. Case.  Patient has a history of chronic pain syndrome presently on Dilaudid every 4 hours.  Goes to [facility] pain management clinic.  HPI   Prior to Admission medications   Medication Sig Start Date End Date Taking? Authorizing Provider  acetaminophen (TYLENOL) 650 MG CR tablet Take 1,300 mg by mouth every 8 (eight) hours as needed for pain.   Yes [provider]  enalapril (VASOTEC) 10 MG tablet 10 mg tablet; oral every day 06/03/17  Yes Copper Basnett, [name], MD  HYDROmorphone (DILAUDID) 4 MG tablet Take 1 tablet (4 mg total) by mouth every 4 (four) hours as needed for severe pain. Do not fill until 11/24/15 04/07/16  Yes [name], MD  indomethacin (INDOCIN) 50 MG capsule Take 1 capsule (50 mg total) by mouth 3 (three) times daily as needed. 06/03/17  Yes Madline Oesterling, [name], MD  tadalafil (CIALIS) 20 MG tablet Take 0.5-1 tablets (10-20 mg total) by mouth every other day as needed for erectile dysfunction. 07/02/16  Yes Mearle Drew, [name], MD  doxepin (SINEQUAN) 10 MG capsule TAKE (1) CAPSULE BY MOUTH AT BEDTIME AS NEEDED. Patient not taking: Reported on 12/01/2017 06/22/17   [name], MD  Doxepin HCl 6 MG TABS Take 1 tablet (6 mg total) by mouth at bedtime. Patient not taking: Reported on 12/01/2017 12/15/16   [name], MD  promethazine (PHENERGAN) 25 MG tablet Take 0.5 tablets (12.5 mg total) by mouth every 6 (six) hours as needed for nausea. 10/29/17 11/28/17  Jacqualine Mau, NP    Allergies  Allergen Reactions  . Codeine   . Escitalopram Other (See Comments)    Patient states it caused night terrors  . Hydrocodone Nausea And Vomiting  . Oxycodone Nausea And  Vomiting  . Penicillins Nausea Only    .Has patient had a PCN reaction causing immediate rash, facial/tongue/throat swelling, SOB or lightheadedness with hypotension: No Has patient had a PCN reaction causing severe rash involving mucus membranes or skin necrosis: No  Has patient had a PCN reaction that required hospitalization: No Has patient had a PCN reaction occurring within the last 10 years: No If all of the above answers are "NO", then may proceed with Cephalosporin use.   . Levofloxacin Rash    Patient Active Problem List   Diagnosis Date Noted  . Hospital discharge follow-up 09/01/2016  . Cough 09/01/2016  . Pneumonia due to Mycoplasma pneumoniae 09/01/2016  . Lobar pneumonia ([place]) 08/27/2016  . Fever, unknown origin 08/26/2016  . Sepsis due to undetermined organism ([place]) 08/25/2016  . Gram negative sepsis ([place]) 08/25/2016  . Bacteremia due to Gram-negative bacteria 08/25/2016  . Syncope 08/25/2016  . Syncope and collapse 08/24/2016  . Sepsis due to pneumonia ([place]) 08/24/2016  . Hyperglycemia 08/24/2016  . Nausea without vomiting 07/02/2016  . Erectile dysfunction 07/02/2016  . SLAP lesion of shoulder 08/04/2012  . Muscle weakness (generalized) 08/04/2012  . Chronic neck and back pain 08/31/2011  . Deformity, acquired 08/31/2011  . Essential hypertension 08/31/2011  . Insomnia 08/31/2011  . Epigastric discomfort 05/03/2011  . LUMBAR SPRAIN AND STRAIN 10/22/2009  . CUBITAL TUNNEL SYNDROME 10/24/2007  . SHOULDER PAIN 10/24/2007  .  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 ELBOW PAIN 05/25/2007    Past Medical History:  Diagnosis Date  . Arthritis   . Gout   . Hypertension     Past Surgical History:  Procedure Laterality Date  . ELBOW SURGERY    . FOOT FASCIOTOMY     X 5  . HEMORRHOID SURGERY     X2, Dr. Arnoldo Morale  . NECK EXPLORATION    . SHOULDER ARTHROTOMY     multiple     Social History   Socioeconomic History  . Marital status: Single    Spouse  name: Not on file  . Number of children: 0  . Years of education: Not on file  . Highest education level: Not on file  Occupational History  . Occupation: Chemical engineer: [employer]  Social Needs  . Financial resource strain: Not on file  . Food insecurity:    Worry: Not on file    Inability: Not on file  . Transportation needs:    Medical: Not on file    Non-medical: Not on file  Tobacco Use  . Smoking status: Never Smoker  . Smokeless tobacco: Never Used  Substance and Sexual Activity  . Alcohol use: No  . Drug use: No  . Sexual activity: Not Currently  Lifestyle  . Physical activity:    Days per week: Not on file    Minutes per session: Not on file  . Stress: Not on file  Relationships  . Social connections:    Talks on phone: Not on file    Gets together: Not on file    Attends religious service: Not on file    Active member of club or organization: Not on file    Attends meetings of clubs or organizations: Not on file    Relationship status: Not on file  . Intimate partner violence:    Fear of current or ex partner: Not on file    Emotionally abused: Not on file    Physically abused: Not on file    Forced sexual activity: Not on file  Other Topics Concern  . Not on file  Social History Narrative  . Not on file    Family History  Problem Relation Age of Onset  . Diabetes Mother   . COPD Father   . Colon cancer Neg Hx      Review of Systems  Constitutional: Negative.  Negative for chills and fever.  Respiratory: Negative for shortness of breath.   Cardiovascular: Negative for chest pain.  Neurological: Negative for dizziness and headaches.    Vitals:   12/01/17 1515  BP: (!) 155/102  Pulse: 81  Resp: 16  Temp: 98 F (36.7 C)  SpO2: 95%    Physical Exam  Constitutional: He is oriented to person, place, and time. He appears well-developed and well-nourished.  HENT:  Head: Normocephalic and atraumatic.  Eyes: Pupils  are equal, round, and reactive to light. EOM are normal.  Neck: Normal range of motion.  Cardiovascular: Normal rate.  Pulmonary/Chest: Effort normal.  Musculoskeletal: Normal range of motion.  Neurological: He is alert and oriented to person, place, and time.  Psychiatric: He has a normal mood and affect. His behavior is normal.  Vitals reviewed.   Deitrich comes in today requesting Xanax refill.  Medication that was not started by me or anyone else at this office.  He is under contract with pain management clinic and it would be a  contract violation to get controlled substances from another physician. When I took over his case I made it clear and it was very well understood that I would not prescribe controlled substances to him.  I again made this very clear to him today.  He does not agree but understands.  ASSESSMENT & PLAN: Tarvis was seen today for medication refill.  Diagnoses and all orders for this visit:  Chronic pain syndrome    Patient Instructions       If you have lab work done today you will be contacted with your lab results within the next 2 weeks.  If you have not heard from [place] then please contact us. The fastest way to get your results is to register for My Chart.   IF you received an x-ray today, you will receive an invoice from [facility]. Please contact [facility] at [phone] with questions or concerns regarding your invoice.   IF you received labwork today, you will receive an invoice from [facility]. Please contact [facility] at [phone] with questions or concerns regarding your invoice.   Our billing staff will not be able to assist you with questions regarding bills from these companies.  You will be contacted with the lab results as soon as they are available. The fastest way to get your results is to activate your My Chart account. Instructions are located on the last page of this paperwork. If you have not heard from [place] regarding  the results in 2 weeks, please contact this office.     Living With Anxiety After being diagnosed with an anxiety disorder, you may be relieved to know why you have felt or behaved a certain way. It is natural to also feel overwhelmed about the treatment ahead and what it will mean for your life. With care and support, you can manage this condition and recover from it. How to cope with anxiety Dealing with stress Stress is your body's reaction to life changes and events, both good and bad. Stress can last just a few hours or it can be ongoing. Stress can play a major role in anxiety, so it is important to learn both how to cope with stress and how to think about it differently. Talk with your health care provider or a counselor to learn more about stress reduction. He or she may suggest some stress reduction techniques, such as:  Music therapy. This can include creating or listening to music that you enjoy and that inspires you.  Mindfulness-based meditation. This involves being aware of your normal breaths, rather than trying to control your breathing. It can be done while sitting or walking.  Centering prayer. This is a kind of meditation that involves focusing on a word, phrase, or sacred image that is meaningful to you and that brings you peace.  Deep breathing. To do this, expand your stomach and inhale slowly through your nose. Hold your breath for 3-5 seconds. Then exhale slowly, allowing your stomach muscles to relax.  Self-talk. This is a skill where you identify thought patterns that lead to anxiety reactions and correct those thoughts.  Muscle relaxation. This involves tensing muscles then relaxing them.  Choose a stress reduction technique that fits your lifestyle and personality. Stress reduction techniques take time and practice. Set aside 5-15 minutes a day to do them. Therapists can offer training in these techniques. The training may be covered by some insurance plans. Other  things you can do to manage stress include:  Keeping a stress  diary. This can help you learn what triggers your stress and ways to control your response.  Thinking about how you respond to certain situations. You may not be able to control everything, but you can control your reaction.  Making time for activities that help you relax, and not feeling guilty about spending your time in this way.  Therapy combined with coping and stress-reduction skills provides the best chance for successful treatment. Medicines Medicines can help ease symptoms. Medicines for anxiety include:  Anti-anxiety drugs.  Antidepressants.  Beta-blockers.  Medicines may be used as the main treatment for anxiety disorder, along with therapy, or if other treatments are not working. Medicines should be prescribed by a health care provider. Relationships Relationships can play a big part in helping you recover. Try to spend more time connecting with trusted friends and family members. Consider going to couples counseling, taking family education classes, or going to family therapy. Therapy can help you and others better understand the condition. How to recognize changes in your condition Everyone has a different response to treatment for anxiety. Recovery from anxiety happens when symptoms decrease and stop interfering with your daily activities at home or work. This may mean that you will start to:  Have better concentration and focus.  Sleep better.  Be less irritable.  Have more energy.  Have improved memory.  It is important to recognize when your condition is getting worse. Contact your health care provider if your symptoms interfere with home or work and you do not feel like your condition is improving. Where to find help and support: You can get help and support from these sources:  Self-help groups.  Online and [employer].  A trusted spiritual leader.  Couples counseling.  Family  education classes.  Family therapy.  Follow these instructions at home:  Eat a healthy diet that includes plenty of vegetables, fruits, whole grains, low-fat dairy products, and lean protein. Do not eat a lot of foods that are high in solid fats, added sugars, or salt.  Exercise. Most adults should do the following: ? Exercise for at least 150 minutes each week. The exercise should increase your heart rate and make you sweat (moderate-intensity exercise). ? Strengthening exercises at least twice a week.  Cut down on caffeine, tobacco, alcohol, and other potentially harmful substances.  Get the right amount and quality of sleep. Most adults need 7-9 hours of sleep each night.  Make choices that simplify your life.  Take over-the-counter and prescription medicines only as told by your health care provider.  Avoid caffeine, alcohol, and certain over-the-counter cold medicines. These may make you feel worse. Ask your pharmacist which medicines to avoid.  Keep all follow-up visits as told by your health care provider. This is important. Questions to ask your health care provider  Would I benefit from therapy?  How often should I follow up with a health care provider?  How long do I need to take medicine?  Are there any long-term side effects of my medicine?  Are there any alternatives to taking medicine? Contact a health care provider if:  You have a hard time staying focused or finishing daily tasks.  You spend many hours a day feeling worried about everyday life.  You become exhausted by worry.  You start to have headaches, feel tense, or have nausea.  You urinate more than normal.  You have diarrhea. Get help right away if:  You have a racing heart and shortness of breath.  You  have thoughts of hurting yourself or others. If you ever feel like you may hurt yourself or others, or have thoughts about taking your own life, get help right away. You can go to your nearest  emergency department or call:  Your local emergency services (911 in the [place]  A suicide crisis helpline, such as the [place] at 912-416-8606. This is open 24-hours a day.  Summary  Taking steps to deal with stress can help calm you.  Medicines cannot cure anxiety disorders, but they can help ease symptoms.  Family, friends, and partners can play a big part in helping you recover from an anxiety disorder. This information is not intended to replace advice given to you by your health care provider. Make sure you discuss any questions you have with your health care provider. Document Released: 12/23/2015 Document Revised: 12/23/2015 Document Reviewed: 12/23/2015 Elsevier Interactive Patient Education  2018 Elsevier Inc.      Agustina Caroli, MD [facility]

## 2017-12-01 NOTE — Patient Instructions (Addendum)
   If you have lab work done today you will be contacted with your lab results within the next 2 weeks.  If you have not heard from us then please contact us. The fastest way to get your results is to register for My Chart.   IF you received an x-ray today, you will receive an invoice from [facility]. Please contact [facility] at [phone] with questions or concerns regarding your invoice.   IF you received labwork today, you will receive an invoice from [facility]. Please contact [facility] at [phone] with questions or concerns regarding your invoice.   Our billing staff will not be able to assist you with questions regarding bills from these companies.  You will be contacted with the lab results as soon as they are available. The fastest way to get your results is to activate your My Chart account. Instructions are located on the last page of this paperwork. If you have not heard from us regarding the results in 2 weeks, please contact this office.      Living With Anxiety After being diagnosed with an anxiety disorder, you may be relieved to know why you have felt or behaved a certain way. It is natural to also feel overwhelmed about the treatment ahead and what it will mean for your life. With care and support, you can manage this condition and recover from it. How to cope with anxiety Dealing with stress Stress is your body's reaction to life changes and events, both good and bad. Stress can last just a few hours or it can be ongoing. Stress can play a major role in anxiety, so it is important to learn both how to cope with stress and how to think about it differently. Talk with your health care provider or a counselor to learn more about stress reduction. He or she may suggest some stress reduction techniques, such as:  Music therapy. This can include creating or listening to music that you enjoy and that inspires you.  Mindfulness-based meditation. This  involves being aware of your normal breaths, rather than trying to control your breathing. It can be done while sitting or walking.  Centering prayer. This is a kind of meditation that involves focusing on a word, phrase, or sacred image that is meaningful to you and that brings you peace.  Deep breathing. To do this, expand your stomach and inhale slowly through your nose. Hold your breath for 3-5 seconds. Then exhale slowly, allowing your stomach muscles to relax.  Self-talk. This is a skill where you identify thought patterns that lead to anxiety reactions and correct those thoughts.  Muscle relaxation. This involves tensing muscles then relaxing them.  Choose a stress reduction technique that fits your lifestyle and personality. Stress reduction techniques take time and practice. Set aside 5-15 minutes a day to do them. Therapists can offer training in these techniques. The training may be covered by some insurance plans. Other things you can do to manage stress include:  Keeping a stress diary. This can help you learn what triggers your stress and ways to control your response.  Thinking about how you respond to certain situations. You may not be able to control everything, but you can control your reaction.  Making time for activities that help you relax, and not feeling guilty about spending your time in this way.  Therapy combined with coping and stress-reduction skills provides the best chance for successful treatment. Medicines Medicines can help ease symptoms. Medicines for   anxiety include:  Anti-anxiety drugs.  Antidepressants.  Beta-blockers.  Medicines may be used as the main treatment for anxiety disorder, along with therapy, or if other treatments are not working. Medicines should be prescribed by a health care provider. Relationships Relationships can play a big part in helping you recover. Try to spend more time connecting with trusted friends and family members.  Consider going to couples counseling, taking family education classes, or going to family therapy. Therapy can help you and others better understand the condition. How to recognize changes in your condition Everyone has a different response to treatment for anxiety. Recovery from anxiety happens when symptoms decrease and stop interfering with your daily activities at home or work. This may mean that you will start to:  Have better concentration and focus.  Sleep better.  Be less irritable.  Have more energy.  Have improved memory.  It is important to recognize when your condition is getting worse. Contact your health care provider if your symptoms interfere with home or work and you do not feel like your condition is improving. Where to find help and support: You can get help and support from these sources:  Self-help groups.  Online and community organizations.  A trusted spiritual leader.  Couples counseling.  Family education classes.  Family therapy.  Follow these instructions at home:  Eat a healthy diet that includes plenty of vegetables, fruits, whole grains, low-fat dairy products, and lean protein. Do not eat a lot of foods that are high in solid fats, added sugars, or salt.  Exercise. Most adults should do the following: ? Exercise for at least 150 minutes each week. The exercise should increase your heart rate and make you sweat (moderate-intensity exercise). ? Strengthening exercises at least twice a week.  Cut down on caffeine, tobacco, alcohol, and other potentially harmful substances.  Get the right amount and quality of sleep. Most adults need 7-9 hours of sleep each night.  Make choices that simplify your life.  Take over-the-counter and prescription medicines only as told by your health care provider.  Avoid caffeine, alcohol, and certain over-the-counter cold medicines. These may make you feel worse. Ask your pharmacist which medicines to  avoid.  Keep all follow-up visits as told by your health care provider. This is important. Questions to ask your health care provider  Would I benefit from therapy?  How often should I follow up with a health care provider?  How long do I need to take medicine?  Are there any long-term side effects of my medicine?  Are there any alternatives to taking medicine? Contact a health care provider if:  You have a hard time staying focused or finishing daily tasks.  You spend many hours a day feeling worried about everyday life.  You become exhausted by worry.  You start to have headaches, feel tense, or have nausea.  You urinate more than normal.  You have diarrhea. Get help right away if:  You have a racing heart and shortness of breath.  You have thoughts of hurting yourself or others. If you ever feel like you may hurt yourself or others, or have thoughts about taking your own life, get help right away. You can go to your nearest emergency department or call:  Your local emergency services (911 in the [place]  A suicide crisis helpline, such as the National Suicide Prevention Lifeline at 1-800-273-8255. This is open 24-hours a day.  Summary  Taking steps to deal with stress can help calm   you.  Medicines cannot cure anxiety disorders, but they can help ease symptoms.  Family, friends, and partners can play a big part in helping you recover from an anxiety disorder. This information is not intended to replace advice given to you by your health care provider. Make sure you discuss any questions you have with your health care provider. Document Released: 12/23/2015 Document Revised: 12/23/2015 Document Reviewed: 12/23/2015 Elsevier Interactive Patient Education  2018 Elsevier Inc.  

## 2018-02-20 DIAGNOSIS — M222X1 Patellofemoral disorders, right knee: Secondary | ICD-10-CM | POA: Insufficient documentation

## 2018-05-10 ENCOUNTER — Other Ambulatory Visit: Payer: Self-pay | Admitting: Emergency Medicine

## 2018-05-10 DIAGNOSIS — I10 Essential (primary) hypertension: Secondary | ICD-10-CM

## 2018-06-28 IMAGING — MR MR LUMBAR SPINE W/O CM
4 of 5 series · 15 of 48 positions shown · non-contrast
Comparison: MRI of the lumbar spine 04/30/2016

CLINICAL DATA: Low back pain and weakness extending into the right
lower extremity

EXAM:
MRI LUMBAR SPINE WITHOUT CONTRAST
TECHNIQUE: Multiplanar, multisequence MR imaging of the lumbar spine was
performed. No intravenous contrast was administered.

[Series 3: T2 · sagittal · 4.0mm · 0.75mm/px · 6 of 15 slices shown (1 of 2)]
[im 1/15]
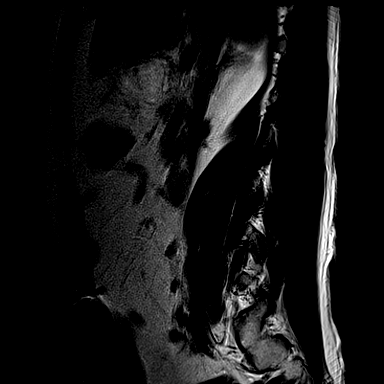
[im 3/15]
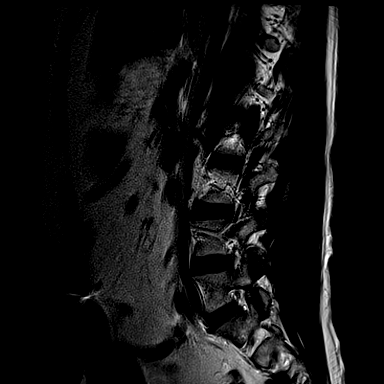
[im 6/15]
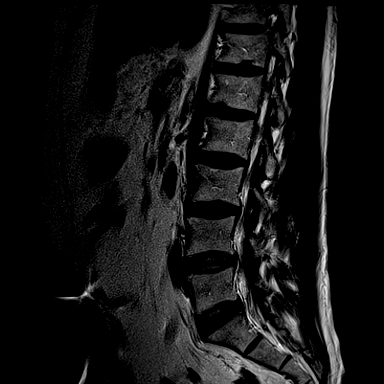
[im 9/15]
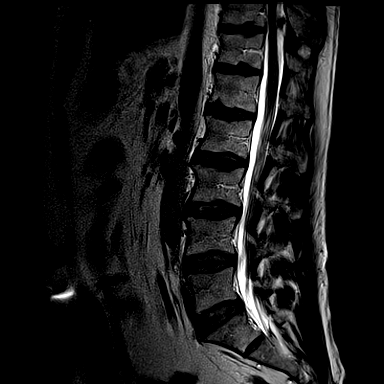
[im 12/15]
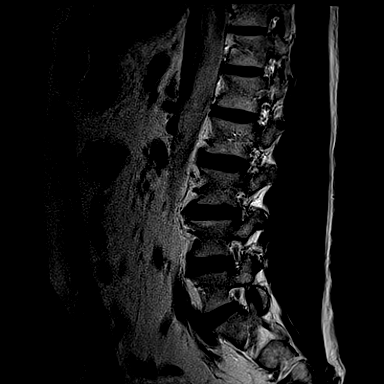
[im 15/15]
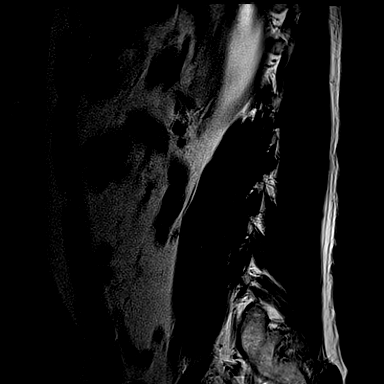

[Series 4: T1 · sagittal · 4.0mm · 0.37mm/px · 3 of 15 slices shown (1 of 2)]
[im 3/15]
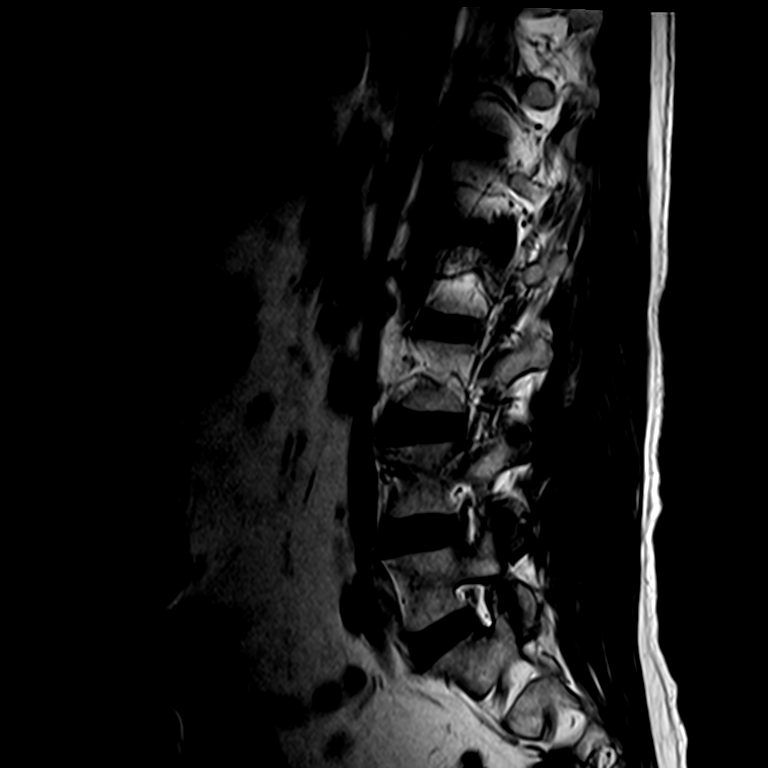
[im 9/15]
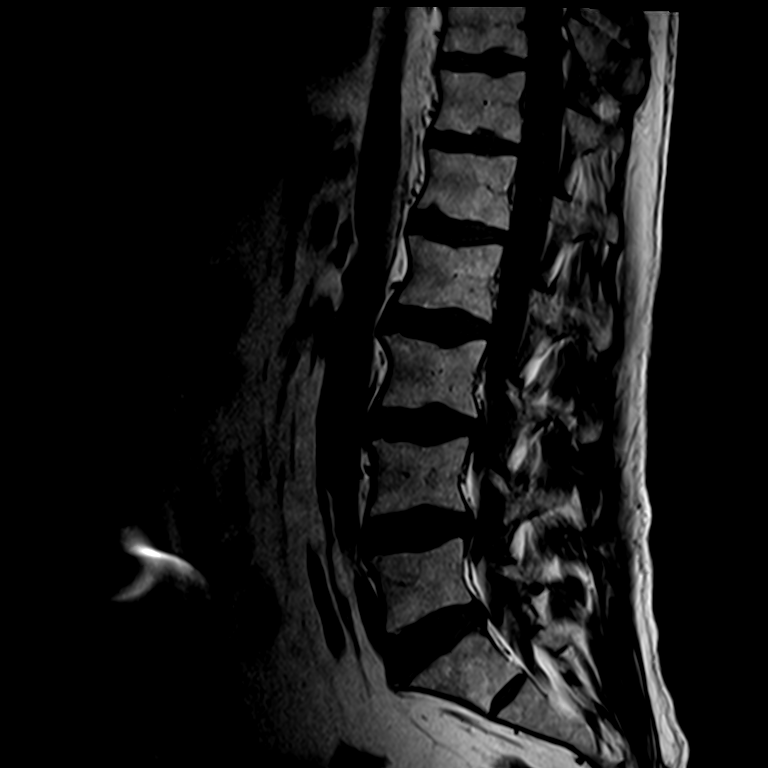
[im 15/15]
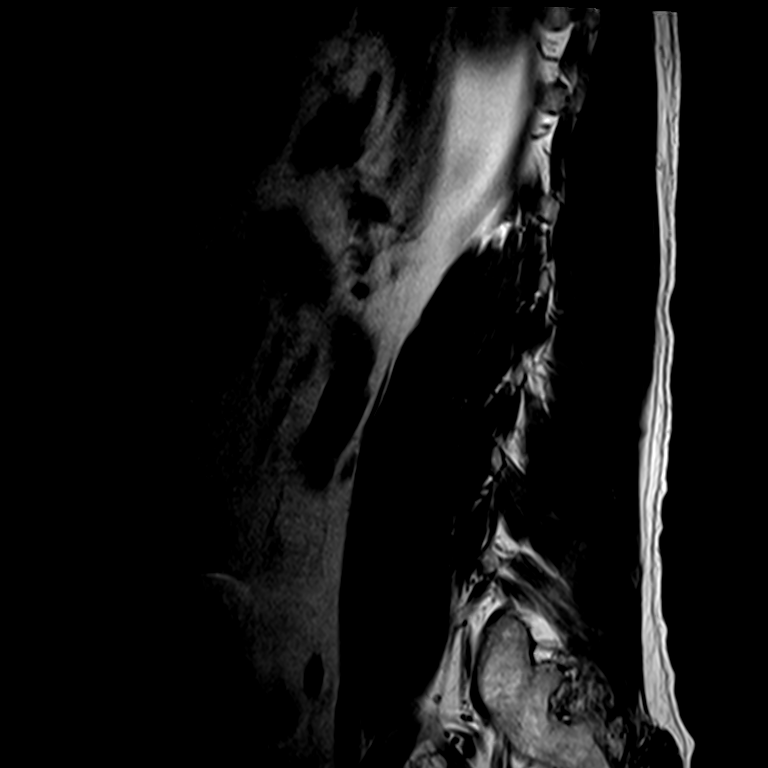

[Series 6: T2 · axial · 4.0mm · 0.23mm/px · z∈[-144,+3]mm · 3 of 41 slices shown (2 of 2)]
[im 6/41]
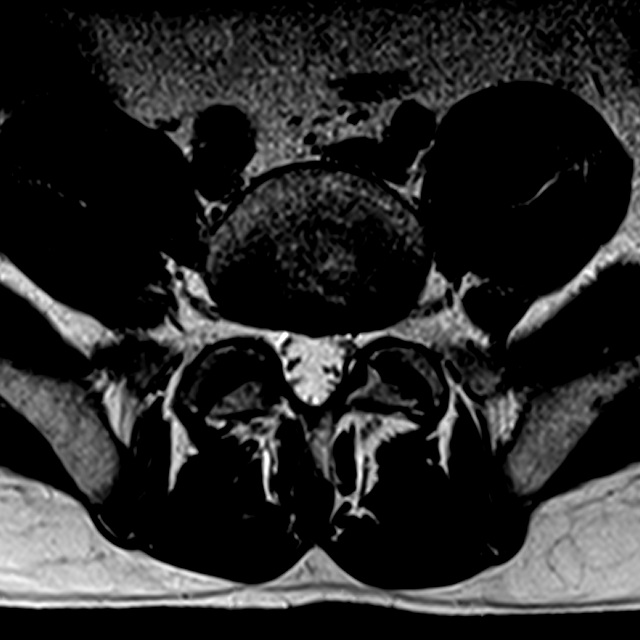
[im 21/41]
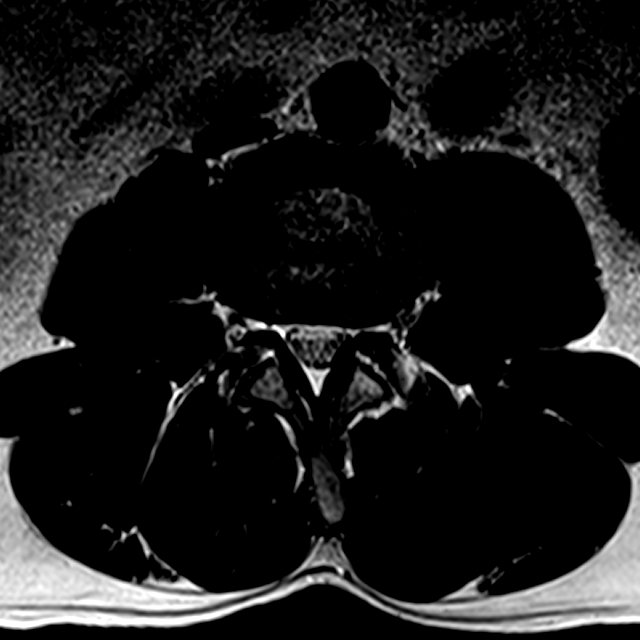
[im 35/41]
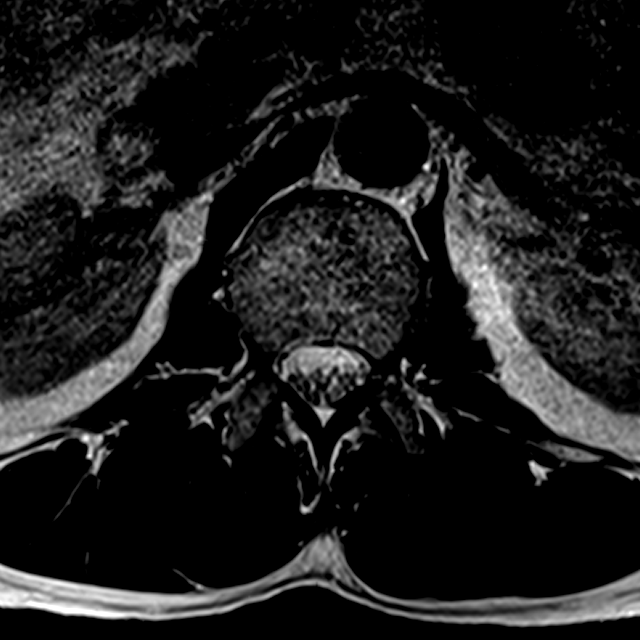

[Series 7: T1 · axial · 4.0mm · 0.23mm/px · z∈[-144,+3]mm · 3 of 41 slices shown (2 of 2)]
[im 6/41]
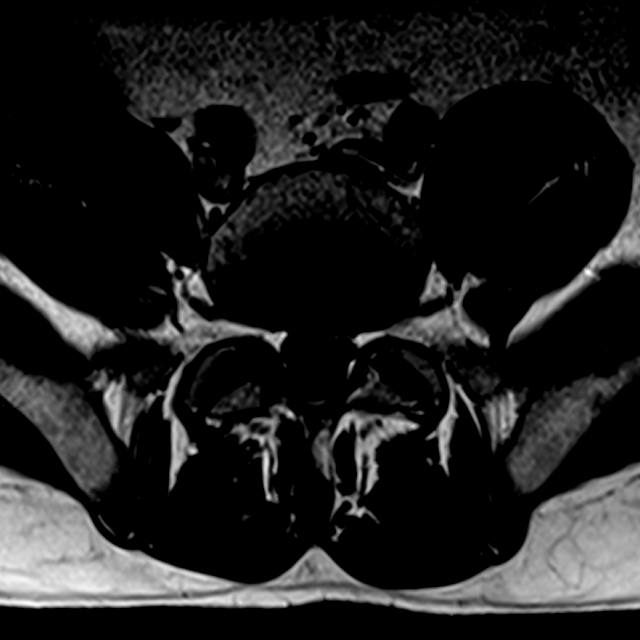
[im 21/41]
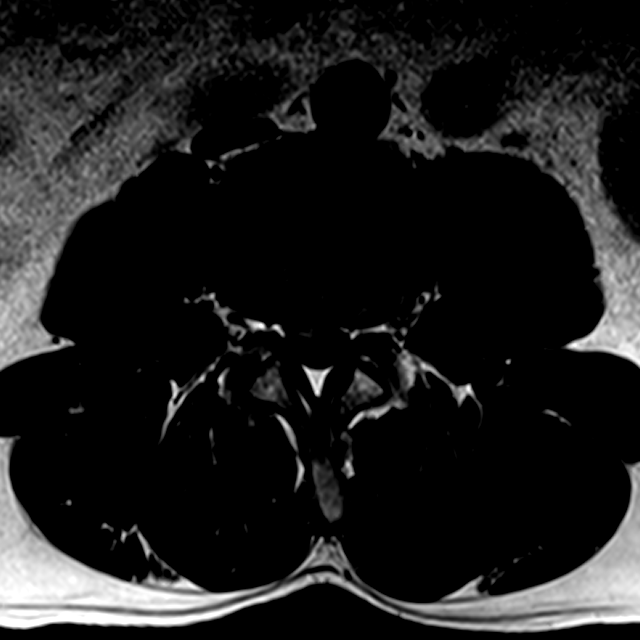
[im 35/41]
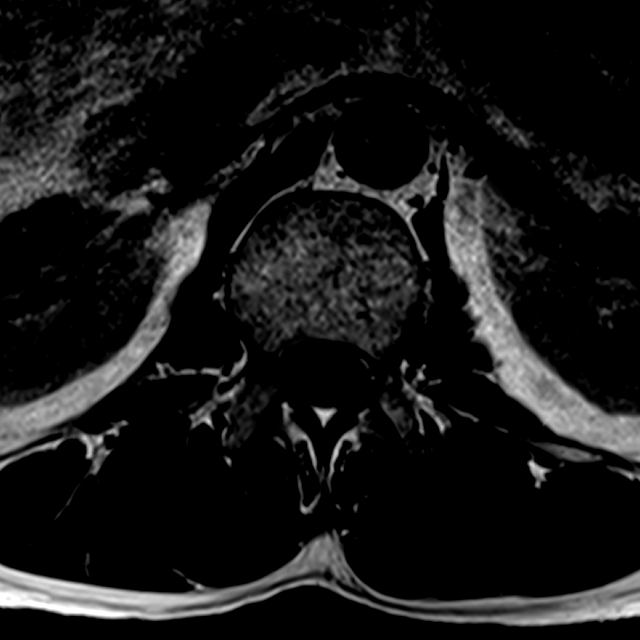

[15 of 48 positions shown; findings below may reference images not displayed]

FINDINGS: Segmentation: 5 non rib-bearing lumbar type vertebral bodies are
present.

Alignment:  AP alignment is anatomic.

Vertebrae: Marrow signal and vertebral body heights are normal.
Schmorl's nodes in the lower thoracic and upper lumbar spine are
stable.

Conus medullaris and cauda equina: Conus extends to the L1 level.
Conus and cauda equina appear normal.

Paraspinal and other soft tissues: Limited imaging of the abdomen is
unremarkable.

Disc levels:

L1-2:  Negative.

L2-3:  Negative.

L3-4: Mild lateral disc bulging and short pedicles lead to stable
appearance of mild foraminal narrowing.

L4-5: Lateral disc bulging and short pedicles lead to stable
appearance of mild foraminal narrowing bilaterally. Central canal is
patent.

L5-S1: Negative.
IMPRESSION: 1. Stable lateral disc bulging in setting of short pedicles leads to
mild foraminal narrowing bilaterally at L3-4 and L4-5 without
significant interval change.
2. No acute abnormality.

## 2018-09-04 ENCOUNTER — Encounter: Payer: Self-pay | Admitting: Registered Nurse

## 2018-09-04 ENCOUNTER — Ambulatory Visit (INDEPENDENT_AMBULATORY_CARE_PROVIDER_SITE_OTHER): Payer: Medicare Other | Admitting: Registered Nurse

## 2018-09-04 ENCOUNTER — Other Ambulatory Visit: Payer: Self-pay

## 2018-09-04 VITALS — BP 120/82 | HR 92 | Temp 98.0°F | Resp 16 | Wt 160.0 lb

## 2018-09-04 DIAGNOSIS — Z8739 Personal history of other diseases of the musculoskeletal system and connective tissue: Secondary | ICD-10-CM | POA: Diagnosis not present

## 2018-09-04 DIAGNOSIS — G4700 Insomnia, unspecified: Secondary | ICD-10-CM

## 2018-09-04 DIAGNOSIS — R110 Nausea: Secondary | ICD-10-CM

## 2018-09-04 MED ORDER — PROMETHAZINE HCL 25 MG PO TABS: 12.5000 mg | ORAL_TABLET | Freq: Four times a day (QID) | ORAL | 3 refills | Status: DC | PRN

## 2018-09-04 MED ORDER — ZOLPIDEM TARTRATE 5 MG PO TABS: 5.0000 mg | ORAL_TABLET | Freq: Every evening | ORAL | 0 refills | Status: DC | PRN

## 2018-09-04 MED ORDER — INDOMETHACIN 50 MG PO CAPS: 50.0000 mg | ORAL_CAPSULE | Freq: Three times a day (TID) | ORAL | 3 refills | Status: DC | PRN

## 2018-09-04 NOTE — Patient Instructions (Signed)
° ° ° °  If you have lab work done today you will be contacted with your lab results within the next 2 weeks.  If you have not heard from us then please contact us. The fastest way to get your results is to register for My Chart. ° ° °IF you received an x-ray today, you will receive an invoice from [facility]. Please contact [facility] at [phone] with questions or concerns regarding your invoice.  ° °IF you received labwork today, you will receive an invoice from [facility]. Please contact [facility] at [phone] with questions or concerns regarding your invoice.  ° °Our billing staff will not be able to assist you with questions regarding bills from these companies. ° °You will be contacted with the lab results as soon as they are available. The fastest way to get your results is to activate your My Chart account. Instructions are located on the last page of this paperwork. If you have not heard from us regarding the results in 2 weeks, please contact this office. °  ° ° ° °

## 2018-09-04 NOTE — Progress Notes (Signed)
Established Patient Office Visit  Subjective:  Patient ID: Duane [name], male    DOB: Oct 28, 1960  Age: 58 y.o. MRN: [id number]  CC:  Chief Complaint  Patient presents with   Establish Care    TOC pt need pcp to manage medications    Medication Refill    HPI Duane [name] presents for visit to establish care.   He has a history of multiple orthopedic surgeries. He is managed by pain management for chronic pain. Through our office, he receives treatment for gout and insomnia.  In the past, he has has success with Ambien for his insomnia. He understands that being prescribed a controlled substance by another provider besides his pain management provider may constitute a violation of his contract with his pain management provider that may warrant dismissal from their practice, and that we do not manage chronic opioid use in our office.   He refuses colonoscopy referral at this time. He demonstrates understanding of risks  He refuses flu vaccination at this time. He demonstrates understanding of risks.  Past Medical History:  Diagnosis Date   Arthritis    Gout    Hypertension     Past Surgical History:  Procedure Laterality Date   ELBOW SURGERY     FOOT FASCIOTOMY     X 5   HEMORRHOID SURGERY     X2, Dr. Arnoldo Morale   NECK EXPLORATION     SHOULDER ARTHROTOMY     multiple     Family History  Problem Relation Age of Onset   Diabetes Mother    COPD Father    Colon cancer Neg Hx     Social History   Socioeconomic History   Marital status: Single    Spouse name: Not on file   Number of children: 0   Years of education: Not on file   Highest education level: Not on file  Occupational History   Occupation: Engineer, structural    Employer: [employer]  Social Needs   Financial resource strain: Not on file   Food insecurity    Worry: Not on file    Inability: Not on file   Transportation needs    Medical: Not on file    Non-medical: Not  on file  Tobacco Use   Smoking status: Never Smoker   Smokeless tobacco: Never Used  Substance and Sexual Activity   Alcohol use: No   Drug use: No   Sexual activity: Not Currently  Lifestyle   Physical activity    Days per week: Not on file    Minutes per session: Not on file   Stress: Not on file  Relationships   Social connections    Talks on phone: Not on file    Gets together: Not on file    Attends religious service: Not on file    Active member of club or organization: Not on file    Attends meetings of clubs or organizations: Not on file    Relationship status: Not on file   Intimate partner violence    Fear of current or ex partner: Not on file    Emotionally abused: Not on file    Physically abused: Not on file    Forced sexual activity: Not on file  Other Topics Concern   Not on file  Social History Narrative   Not on file    Outpatient Medications Prior to Visit  Medication Sig Dispense Refill   acetaminophen (TYLENOL) 650 MG CR  tablet Take 1,300 mg by mouth every 8 (eight) hours as needed for pain.     enalapril (VASOTEC) 10 MG tablet TAKE (1) TABLET BY MOUTH ONCE DAILY. 90 tablet 0   HYDROmorphone (DILAUDID) 4 MG tablet Take 1 tablet (4 mg total) by mouth every 4 (four) hours as needed for severe pain. Do not fill until 11/24/15 180 tablet 0   indomethacin (INDOCIN) 50 MG capsule Take 1 capsule (50 mg total) by mouth 3 (three) times daily as needed. 60 capsule 3   tadalafil (CIALIS) 20 MG tablet Take 0.5-1 tablets (10-20 mg total) by mouth every other day as needed for erectile dysfunction. 10 tablet 11   doxepin (SINEQUAN) 10 MG capsule TAKE (1) CAPSULE BY MOUTH AT BEDTIME AS NEEDED. 60 capsule 0   Doxepin HCl 6 MG TABS Take 1 tablet (6 mg total) by mouth at bedtime. 60 tablet 3   promethazine (PHENERGAN) 25 MG tablet Take 0.5 tablets (12.5 mg total) by mouth every 6 (six) hours as needed for nausea. 60 tablet 3   No facility-administered  medications prior to visit.     Allergies  Allergen Reactions   Codeine    Escitalopram Other (See Comments)    Patient states it caused night terrors   Hydrocodone Nausea And Vomiting   Oxycodone Nausea And Vomiting   Penicillins Nausea Only    .Has patient had a PCN reaction causing immediate rash, facial/tongue/throat swelling, SOB or lightheadedness with hypotension: No Has patient had a PCN reaction causing severe rash involving mucus membranes or skin necrosis: No  Has patient had a PCN reaction that required hospitalization: No Has patient had a PCN reaction occurring within the last 10 years: No If all of the above answers are "NO", then may proceed with Cephalosporin use.    Trazodone Other (See Comments)    Increased BP and HR per patient   Levofloxacin Rash    ROS Review of Systems  Constitutional: Negative.   HENT: Negative.   Eyes: Negative.   Respiratory: Negative.   Cardiovascular: Negative.   Gastrointestinal: Negative.   Endocrine: Negative.   Genitourinary: Negative.   Musculoskeletal: Positive for arthralgias, back pain and myalgias.  Skin: Negative.   Allergic/Immunologic: Negative.   Neurological: Negative.   Hematological: Negative.   Psychiatric/Behavioral: Positive for sleep disturbance.  All other systems reviewed and are negative.     Objective:    Physical Exam  Constitutional: He is oriented to person, place, and time. He appears well-developed and well-nourished. No distress.  Cardiovascular: Normal rate and regular rhythm.  Pulmonary/Chest: No respiratory distress.  Neurological: He is alert and oriented to person, place, and time.  Skin: Skin is warm and dry. No rash noted. He is not diaphoretic. No erythema. No pallor.  Psychiatric: He has a normal mood and affect. His behavior is normal. Judgment and thought content normal.  Nursing note and vitals reviewed.   BP 120/82    Pulse 92    Temp 98 F (36.7 C) (Oral)    Resp 16     Wt 160 lb (72.6 kg)    SpO2 100%    BMI 22.85 kg/m  Wt Readings from Last 3 Encounters:  09/04/18 160 lb (72.6 kg)  12/01/17 164 lb (74.4 kg)  06/03/17 168 lb 9.6 oz (76.5 kg)     Health Maintenance Due  Topic Date Due   TETANUS/TDAP  06/17/1981   COLONOSCOPY  06/17/2012   INFLUENZA VACCINE  08/12/2018    There are  no preventive care reminders to display for this patient.  Lab Results  Component Value Date   TSH 1.350 08/24/2016   Lab Results  Component Value Date   WBC 7.6 08/27/2016   HGB 11.8 (L) 08/27/2016   HCT 34.2 (L) 08/27/2016   MCV 87.7 08/27/2016   PLT 260 08/27/2016   Lab Results  Component Value Date   NA 136 08/27/2016   K 3.5 08/27/2016   CO2 25 08/27/2016   GLUCOSE 101 (H) 08/27/2016   BUN 13 08/27/2016   CREATININE 0.99 08/27/2016   BILITOT 1.1 08/24/2016   ALKPHOS 54 08/24/2016   AST 25 08/24/2016   ALT 18 08/24/2016   PROT 6.9 08/24/2016   ALBUMIN 3.5 08/24/2016   CALCIUM 8.4 (L) 08/27/2016   ANIONGAP 9 08/27/2016   Lab Results  Component Value Date   CHOL 94 08/25/2016   Lab Results  Component Value Date   HDL 22 (L) 08/25/2016   Lab Results  Component Value Date   LDLCALC 54 08/25/2016   Lab Results  Component Value Date   TRIG 91 08/25/2016   Lab Results  Component Value Date   CHOLHDL 4.3 08/25/2016   Lab Results  Component Value Date   HGBA1C 6.3 (H) 08/24/2016      Assessment & Plan:   Problem List Items Addressed This Visit      Other   Insomnia - Primary   Relevant Medications   zolpidem (AMBIEN) 5 MG tablet      Meds ordered this encounter  Medications   zolpidem (AMBIEN) 5 MG tablet    Sig: Take 1 tablet (5 mg total) by mouth at bedtime as needed for sleep.    Dispense:  30 tablet    Refill:  0    Order Specific Question:   Supervising Provider    Answer:   Forrest Moron [id number]    Follow-up: No follow-ups on file.    Maximiano Coss, NP

## 2018-10-06 ENCOUNTER — Other Ambulatory Visit: Payer: Self-pay | Admitting: Registered Nurse

## 2018-10-06 DIAGNOSIS — G4700 Insomnia, unspecified: Secondary | ICD-10-CM

## 2018-10-06 NOTE — Telephone Encounter (Signed)
Requested medication (s) are due for refill today: yes  Requested medication (s) are on the active medication list: yes  Last refill:  09/04/2018  Future visit scheduled: yes  Notes to clinic: refill cannot be delegated    Requested Prescriptions  Pending Prescriptions Disp Refills   zolpidem (AMBIEN) 5 MG tablet [Pharmacy Med Name: ZOLPIDEM TARTRATE 5 MG] 30 tablet 0    Sig: TAKE ONE TABLET BY MOUTH AT BEDTIME AS NEEDED FOR SLEEP.     Not Delegated - Psychiatry:  Anxiolytics/Hypnotics Failed - 10/06/2018 10:04 AM      Failed - This refill cannot be delegated      Failed - Urine Drug Screen completed in last 360 days.      Passed - Valid encounter within last 6 months    Recent Outpatient Visits          1 month ago Insomnia, unspecified type   [facility], Delfino Lovett, NP   10 months ago Chronic pain syndrome   Primary Care at [facility], [name], MD   1 year ago History of gout   Primary Care at [place], [name], MD   1 year ago Insomnia, unspecified type   Primary Care at [facility], [name], MD   1 year ago Chronic neck and back pain   Primary Care at [facility], [name], MD      Future Appointments            In 2 months Maximiano Coss, NP Primary Care at [place], [facility]

## 2018-10-10 NOTE — Telephone Encounter (Signed)
Patient is requesting a refill of the following medications: Requested Prescriptions   Pending Prescriptions Disp Refills  . zolpidem (AMBIEN) 5 MG tablet [Pharmacy Med Name: ZOLPIDEM TARTRATE 5 MG] 30 tablet 0    Sig: TAKE ONE TABLET BY MOUTH AT BEDTIME AS NEEDED FOR SLEEP.    Date of patient request: 10/10/18 Last office visit: 09/04/18 Date of last refill:09/04/18 Last refill amount: 30 Follow up time period per chart: 12/05/18

## 2018-11-15 ENCOUNTER — Other Ambulatory Visit: Payer: Self-pay | Admitting: Registered Nurse

## 2018-11-15 DIAGNOSIS — G4700 Insomnia, unspecified: Secondary | ICD-10-CM

## 2018-11-15 NOTE — Telephone Encounter (Signed)
Requested medication (s) are due for refill today: yes  Requested medication (s) are on the active medication list: yes  Last refill:  9/29/20202  Future visit scheduled: yes  Notes to clinic:  Refill cannot be delegated    Requested Prescriptions  Pending Prescriptions Disp Refills   zolpidem (AMBIEN) 5 MG tablet [Pharmacy Med Name: ZOLPIDEM TARTRATE 5 MG] 30 tablet 0    Sig: TAKE ONE TABLET BY MOUTH AT BEDTIME AS NEEDED FOR SLEEP.     Not Delegated - Psychiatry:  Anxiolytics/Hypnotics Failed - 11/15/2018  2:01 PM      Failed - This refill cannot be delegated      Failed - Urine Drug Screen completed in last 360 days.      Passed - Valid encounter within last 6 months    Recent Outpatient Visits          2 months ago Insomnia, unspecified type   [facility] at [facility], Delfino Lovett, NP   11 months ago Chronic pain syndrome   Primary Care at [facility], [name], MD   1 year ago History of gout   Primary Care at [facility], [name], MD   1 year ago Insomnia, unspecified type   Primary Care at [facility], [name], MD   1 year ago Chronic neck and back pain   Primary Care at [facility], [name], MD      Future Appointments            In 2 days Maximiano Coss, NP Primary Care at [place], [facility]   In 2 weeks Maximiano Coss, NP Primary Care at [place], [facility]

## 2018-11-15 NOTE — Telephone Encounter (Signed)
Patient is requesting a refill of the following medications: Requested Prescriptions   Pending Prescriptions Disp Refills  . zolpidem (AMBIEN) 5 MG tablet [Pharmacy Med Name: ZOLPIDEM TARTRATE 5 MG] 30 tablet 0    Sig: TAKE ONE TABLET BY MOUTH AT BEDTIME AS NEEDED FOR SLEEP.    Date of patient request: 11/15/2018 Last office visit: 09/04/18 Date of last refill:10/10/18 Last refill amount: 30 Follow up time period per chart: 11/17/2018

## 2018-11-17 ENCOUNTER — Ambulatory Visit (INDEPENDENT_AMBULATORY_CARE_PROVIDER_SITE_OTHER): Payer: Medicare Other | Admitting: Registered Nurse

## 2018-11-17 ENCOUNTER — Other Ambulatory Visit: Payer: Self-pay

## 2018-11-17 ENCOUNTER — Encounter: Payer: Self-pay | Admitting: Registered Nurse

## 2018-11-17 VITALS — BP 122/78 | HR 69 | Temp 97.7°F | Resp 16 | Wt 159.0 lb

## 2018-11-17 DIAGNOSIS — G4700 Insomnia, unspecified: Secondary | ICD-10-CM

## 2018-11-17 DIAGNOSIS — I10 Essential (primary) hypertension: Secondary | ICD-10-CM

## 2018-11-17 MED ORDER — ZOLPIDEM TARTRATE 10 MG PO TABS: 10.0000 mg | ORAL_TABLET | Freq: Every evening | ORAL | 2 refills | Status: DC | PRN

## 2018-11-17 MED ORDER — ENALAPRIL MALEATE 10 MG PO TABS: 10.0000 mg | ORAL_TABLET | Freq: Every day | ORAL | 3 refills | Status: DC

## 2018-11-17 NOTE — Progress Notes (Signed)
Established Patient Office Visit  Subjective:  Patient ID: Duane [name], male    DOB: 1960/05/27  Age: 58 y.o. MRN: [id number]  CC:  Chief Complaint  Patient presents with  . Medication Management    follow-up with medication    HPI CLAVIN JOHNSEY presents for med check for [place].  Interested in increasing to 10mg  PO qhs for sleep. As discussed in previous visits, he has a long history of anxiety and perhaps PTSD from work that interferes significantly with his sleep. He has been taking [place] with good effect for some time and does not desire change at this time. He is aware of age restrictions on the prescription of medications like Lorrin Mais - he feels that once he retires, he won't need it anymore.  Otherwise, feels well overall. Needs enalapril refill. BP great today. Returning next week fasting for CPE and Labs.   Past Medical History:  Diagnosis Date  . Arthritis   . Gout   . Hypertension     Past Surgical History:  Procedure Laterality Date  . ELBOW SURGERY    . FOOT FASCIOTOMY     X 5  . HEMORRHOID SURGERY     X2, Dr. Arnoldo Morale  . NECK EXPLORATION    . SHOULDER ARTHROTOMY     multiple     Family History  Problem Relation Age of Onset  . Diabetes Mother   . COPD Father   . Colon cancer Neg Hx     Social History   Socioeconomic History  . Marital status: Single    Spouse name: Not on file  . Number of children: 0  . Years of education: Not on file  . Highest education level: Not on file  Occupational History  . Occupation: Chemical engineer: [employer]  Social Needs  . Financial resource strain: Not on file  . Food insecurity    Worry: Not on file    Inability: Not on file  . Transportation needs    Medical: Not on file    Non-medical: Not on file  Tobacco Use  . Smoking status: Never Smoker  . Smokeless tobacco: Never Used  Substance and Sexual Activity  . Alcohol use: No  . Drug use: No  . Sexual activity: Not  Currently  Lifestyle  . Physical activity    Days per week: Not on file    Minutes per session: Not on file  . Stress: Not on file  Relationships  . Social Herbalist on phone: Not on file    Gets together: Not on file    Attends religious service: Not on file    Active member of club or organization: Not on file    Attends meetings of clubs or organizations: Not on file    Relationship status: Not on file  . Intimate partner violence    Fear of current or ex partner: Not on file    Emotionally abused: Not on file    Physically abused: Not on file    Forced sexual activity: Not on file  Other Topics Concern  . Not on file  Social History Narrative  . Not on file    Outpatient Medications Prior to Visit  Medication Sig Dispense Refill  . acetaminophen (TYLENOL) 650 MG CR tablet Take 1,300 mg by mouth every 8 (eight) hours as needed for pain.    Marland Kitchen enalapril (VASOTEC) 10 MG tablet TAKE (1) TABLET BY MOUTH  ONCE DAILY. 90 tablet 0  . HYDROmorphone (DILAUDID) 4 MG tablet Take 1 tablet (4 mg total) by mouth every 4 (four) hours as needed for severe pain. Do not fill until 11/24/15 180 tablet 0  . indomethacin (INDOCIN) 50 MG capsule Take 1 capsule (50 mg total) by mouth 3 (three) times daily as needed. 60 capsule 3  . tadalafil (CIALIS) 20 MG tablet Take 0.5-1 tablets (10-20 mg total) by mouth every other day as needed for erectile dysfunction. 10 tablet 11  . zolpidem (AMBIEN) 5 MG tablet TAKE ONE TABLET BY MOUTH AT BEDTIME AS NEEDED FOR SLEEP. 30 tablet 0  . promethazine (PHENERGAN) 25 MG tablet Take 0.5 tablets (12.5 mg total) by mouth every 6 (six) hours as needed for nausea. 60 tablet 3   No facility-administered medications prior to visit.     Allergies  Allergen Reactions  . Codeine   . Escitalopram Other (See Comments)    Patient states it caused night terrors  . Hydrocodone Nausea And Vomiting  . Oxycodone Nausea And Vomiting  . Penicillins Nausea Only     .Has patient had a PCN reaction causing immediate rash, facial/tongue/throat swelling, SOB or lightheadedness with hypotension: No Has patient had a PCN reaction causing severe rash involving mucus membranes or skin necrosis: No  Has patient had a PCN reaction that required hospitalization: No Has patient had a PCN reaction occurring within the last 10 years: No If all of the above answers are "NO", then may proceed with Cephalosporin use.   . Trazodone Other (See Comments)    Increased BP and HR per patient  . Levofloxacin Rash    ROS Review of Systems  Constitutional: Negative.   HENT: Negative.   Eyes: Negative.   Respiratory: Negative.   Cardiovascular: Negative.   Gastrointestinal: Negative.   Endocrine: Negative.   Genitourinary: Negative.   Musculoskeletal: Negative.   Skin: Negative.   Allergic/Immunologic: Negative.   Neurological: Negative.   Hematological: Negative.   Psychiatric/Behavioral: The patient is nervous/anxious.   All other systems reviewed and are negative.     Objective:    Physical Exam  Constitutional: He is oriented to person, place, and time. He appears well-developed and well-nourished. No distress.  Cardiovascular: Normal rate and regular rhythm.  Pulmonary/Chest: Effort normal. No respiratory distress.  Neurological: He is alert and oriented to person, place, and time.  Skin: Skin is warm and dry. No rash noted. He is not diaphoretic. No erythema. No pallor.  Psychiatric: He has a normal mood and affect. His behavior is normal. Judgment and thought content normal.  Nursing note and vitals reviewed.   BP 122/78   Pulse 69   Temp 97.7 F (36.5 C) (Oral)   Resp 16   Wt 159 lb (72.1 kg)   SpO2 98%   BMI 22.71 kg/m  Wt Readings from Last 3 Encounters:  11/17/18 159 lb (72.1 kg)  09/04/18 160 lb (72.6 kg)  12/01/17 164 lb (74.4 kg)     Health Maintenance Due  Topic Date Due  . TETANUS/TDAP  06/17/1981  . COLONOSCOPY  06/17/2012     There are no preventive care reminders to display for this patient.  Lab Results  Component Value Date   TSH 1.350 08/24/2016   Lab Results  Component Value Date   WBC 7.6 08/27/2016   HGB 11.8 (L) 08/27/2016   HCT 34.2 (L) 08/27/2016   MCV 87.7 08/27/2016   PLT 260 08/27/2016   Lab Results  Component  Value Date   NA 136 08/27/2016   K 3.5 08/27/2016   CO2 25 08/27/2016   GLUCOSE 101 (H) 08/27/2016   BUN 13 08/27/2016   CREATININE 0.99 08/27/2016   BILITOT 1.1 08/24/2016   ALKPHOS 54 08/24/2016   AST 25 08/24/2016   ALT 18 08/24/2016   PROT 6.9 08/24/2016   ALBUMIN 3.5 08/24/2016   CALCIUM 8.4 (L) 08/27/2016   ANIONGAP 9 08/27/2016   Lab Results  Component Value Date   CHOL 94 08/25/2016   Lab Results  Component Value Date   HDL 22 (L) 08/25/2016   Lab Results  Component Value Date   LDLCALC 54 08/25/2016   Lab Results  Component Value Date   TRIG 91 08/25/2016   Lab Results  Component Value Date   CHOLHDL 4.3 08/25/2016   Lab Results  Component Value Date   HGBA1C 6.3 (H) 08/24/2016      Assessment & Plan:   Problem List Items Addressed This Visit      Cardiovascular and Mediastinum   Essential hypertension     Other   Insomnia - Primary      No orders of the defined types were placed in this encounter.   Follow-up: Return in about 4 months (around 03/12/2019) for med check.   PLAN  Increase ambien to 10mg  PO qhs. Again discussed risks and benefits of this medication, having multiple prescribers handle controlled substances, etc, patient understands   Refill enalapril x 1 year  Return in 1 week for CPE and labs  Return in 4 mos for med check and refills  Patient encouraged to call clinic with any questions, comments, or concerns.   Maximiano Coss, NP

## 2018-11-17 NOTE — Patient Instructions (Signed)
° ° ° °  If you have lab work done today you will be contacted with your lab results within the next 2 weeks.  If you have not heard from us then please contact us. The fastest way to get your results is to register for My Chart. ° ° °IF you received an x-ray today, you will receive an invoice from [facility]. Please contact  Radiology at [phone] with questions or concerns regarding your invoice.  ° °IF you received labwork today, you will receive an invoice from [facility]. Please contact [facility] at [phone] with questions or concerns regarding your invoice.  ° °Our billing staff will not be able to assist you with questions regarding bills from these companies. ° °You will be contacted with the lab results as soon as they are available. The fastest way to get your results is to activate your My Chart account. Instructions are located on the last page of this paperwork. If you have not heard from us regarding the results in 2 weeks, please contact this office. °  ° ° ° °

## 2018-11-23 ENCOUNTER — Ambulatory Visit (INDEPENDENT_AMBULATORY_CARE_PROVIDER_SITE_OTHER): Payer: Medicare Other

## 2018-11-23 ENCOUNTER — Ambulatory Visit (INDEPENDENT_AMBULATORY_CARE_PROVIDER_SITE_OTHER): Payer: Medicare Other | Admitting: Podiatry

## 2018-11-23 ENCOUNTER — Encounter: Payer: Self-pay | Admitting: Podiatry

## 2018-11-23 ENCOUNTER — Other Ambulatory Visit: Payer: Self-pay

## 2018-11-23 VITALS — BP 141/83 | HR 74 | Resp 16

## 2018-11-23 DIAGNOSIS — M722 Plantar fascial fibromatosis: Secondary | ICD-10-CM

## 2018-11-23 DIAGNOSIS — M779 Enthesopathy, unspecified: Secondary | ICD-10-CM

## 2018-11-23 MED ORDER — DICLOFENAC SODIUM 75 MG PO TBEC: 75.0000 mg | DELAYED_RELEASE_TABLET | Freq: Two times a day (BID) | ORAL | 2 refills | Status: DC

## 2018-11-23 NOTE — Patient Instructions (Signed)

## 2018-11-23 NOTE — Progress Notes (Signed)
Subjective:   Patient ID: Duane [name], male   DOB: 58 y.o.   MRN: [id number]   HPI Patient presents stating he is developed a lot of aching in his left forefoot over the last couple months and also it seems to extend into the heel arch even though that is less defined.  He is not sure exactly where this is coming from but states it is becoming gradually more progressive over the last few months and has not been seen for a number of years.  Patient does not smoke and wearing boots for work   Review of Systems  All other systems reviewed and are negative.       Objective:  Physical Exam Vitals signs and nursing note reviewed.  Constitutional:      Appearance: He is well-developed.  Pulmonary:     Effort: Pulmonary effort is normal.  Musculoskeletal: Normal range of motion.  Skin:    General: Skin is warm.  Neurological:     Mental Status: He is alert.     Neurovascular status intact muscle strength found to be adequate range of motion within normal limits.  Patient was found to have quite a bit of inflammation around the fourth MPJ left and into the fifth MPJ left and is noted to have moderate discomfort in the lateral plantar fascia more distal to its insertion into the calcaneus with minimal insertional pain noted.  Patient has good digital perfusion well oriented x3     Assessment:  Probability for forefoot inflammatory capsulitis with possibility for low-grade neuroma symptomatology fourth interspace along with moderate plantar fascial symptomatology left     Plan:  H&P reviewed all conditions and x-ray.  Today I went ahead and I did a sterile prep injected the fourth MPJ and into the fourth interspace 3 mg Dexasone Kenalog 5 mg Xylocaine and applied fascial brace to lift up the arch and placed on diclofenac 75 mg twice daily.  Reappoint to recheck  X-rays were negative for signs of forefoot fracture did indicate screws in the fourth and fifth metatarsal left with good  alignment and small plantar spur with no indication of stress fracture

## 2018-11-29 ENCOUNTER — Encounter: Payer: Self-pay | Admitting: Registered Nurse

## 2018-11-29 ENCOUNTER — Encounter: Payer: Medicare Other | Admitting: Registered Nurse

## 2018-11-29 ENCOUNTER — Ambulatory Visit (INDEPENDENT_AMBULATORY_CARE_PROVIDER_SITE_OTHER): Payer: Medicare Other | Admitting: Registered Nurse

## 2018-11-29 ENCOUNTER — Other Ambulatory Visit: Payer: Self-pay

## 2018-11-29 VITALS — BP 126/80 | HR 73 | Temp 98.0°F | Resp 16 | Wt 159.0 lb

## 2018-11-29 DIAGNOSIS — Z13220 Encounter for screening for lipoid disorders: Secondary | ICD-10-CM | POA: Diagnosis not present

## 2018-11-29 DIAGNOSIS — G4700 Insomnia, unspecified: Secondary | ICD-10-CM

## 2018-11-29 DIAGNOSIS — R12 Heartburn: Secondary | ICD-10-CM | POA: Diagnosis not present

## 2018-11-29 MED ORDER — ESOMEPRAZOLE MAGNESIUM 40 MG PO CPDR: 40.0000 mg | DELAYED_RELEASE_CAPSULE | Freq: Every day | ORAL | 3 refills | Status: DC

## 2018-11-29 MED ORDER — TRAZODONE HCL 50 MG PO TABS: 25.0000 mg | ORAL_TABLET | Freq: Every evening | ORAL | 0 refills | Status: DC | PRN

## 2018-11-29 MED ORDER — ESOMEPRAZOLE MAGNESIUM 40 MG PO CPDR: 40.0000 mg | DELAYED_RELEASE_CAPSULE | Freq: Every day | ORAL | 0 refills | Status: DC

## 2018-11-29 MED ORDER — TRAZODONE HCL 50 MG PO TABS: 25.0000 mg | ORAL_TABLET | Freq: Every evening | ORAL | 3 refills | Status: DC | PRN

## 2018-11-29 MED ORDER — SUCRALFATE 1 G PO TABS: 1.0000 g | ORAL_TABLET | Freq: Three times a day (TID) | ORAL | 0 refills | Status: DC

## 2018-11-29 NOTE — Patient Instructions (Signed)
° ° ° °  If you have lab work done today you will be contacted with your lab results within the next 2 weeks.  If you have not heard from us then please contact us. The fastest way to get your results is to register for My Chart. ° ° °IF you received an x-ray today, you will receive an invoice from [facility]. Please contact [facility] at [phone] with questions or concerns regarding your invoice.  ° °IF you received labwork today, you will receive an invoice from [facility]. Please contact [facility] at [phone] with questions or concerns regarding your invoice.  ° °Our billing staff will not be able to assist you with questions regarding bills from these companies. ° °You will be contacted with the lab results as soon as they are available. The fastest way to get your results is to activate your My Chart account. Instructions are located on the last page of this paperwork. If you have not heard from us regarding the results in 2 weeks, please contact this office. °  ° ° ° °

## 2018-11-30 LAB — LIPID PANEL
Chol/HDL Ratio: 3.6 ratio (ref 0.0–5.0)
Cholesterol, Total: 159 mg/dL (ref 100–199)
HDL: 44 mg/dL (ref >39)
LDL Chol Calc (NIH): 87 mg/dL (ref 0–99)
Triglycerides: 161 mg/dL — ABNORMAL HIGH (ref 0–149)
VLDL Cholesterol Cal: 28 mg/dL (ref 5–40)

## 2018-11-30 LAB — CBC
Hematocrit: 45.1 % (ref 37.5–51.0)
Hemoglobin: 14.9 g/dL (ref 13.0–17.7)
MCH: 29.6 pg (ref 26.6–33.0)
MCHC: 33 g/dL (ref 31.5–35.7)
MCV: 90 fL (ref 79–97)
Platelets: 315 10*3/uL (ref 150–450)
RBC: 5.03 x10E6/uL (ref 4.14–5.80)
RDW: 12.9 % (ref 11.6–15.4)
WBC: 9.5 10*3/uL (ref 3.4–10.8)

## 2018-11-30 LAB — COMPREHENSIVE METABOLIC PANEL
ALT: 21 IU/L (ref 0–44)
AST: 17 IU/L (ref 0–40)
Albumin/Globulin Ratio: 1.4 (ref 1.2–2.2)
Albumin: 4.2 g/dL (ref 3.8–4.9)
Alkaline Phosphatase: 100 IU/L (ref 39–117)
BUN/Creatinine Ratio: 12 (ref 9–20)
BUN: 15 mg/dL (ref 6–24)
Bilirubin Total: 0.4 mg/dL (ref 0.0–1.2)
CO2: 26 mmol/L (ref 20–29)
Calcium: 9.2 mg/dL (ref 8.7–10.2)
Chloride: 102 mmol/L (ref 96–106)
Creatinine, Ser: 1.3 mg/dL — ABNORMAL HIGH (ref 0.76–1.27)
GFR calc Af Amer: 71 mL/min/{1.73_m2} (ref >59)
GFR calc non Af Amer: 61 mL/min/{1.73_m2} (ref >59)
Globulin, Total: 3.1 g/dL (ref 1.5–4.5)
Glucose: 262 mg/dL — ABNORMAL HIGH (ref 65–99)
Potassium: 4.6 mmol/L (ref 3.5–5.2)
Sodium: 139 mmol/L (ref 134–144)
Total Protein: 7.3 g/dL (ref 6.0–8.5)

## 2018-12-04 ENCOUNTER — Encounter: Payer: Self-pay | Admitting: Registered Nurse

## 2018-12-04 DIAGNOSIS — E781 Pure hyperglyceridemia: Secondary | ICD-10-CM

## 2018-12-04 DIAGNOSIS — R739 Hyperglycemia, unspecified: Secondary | ICD-10-CM

## 2018-12-04 NOTE — Progress Notes (Signed)
Pt not fasting, will encourage him to return for fasting labs at next visit. No concerns at this time otherwise.No need for call to patient.  I will mail letter to patient  Kathrin Ruddy, NP

## 2018-12-04 NOTE — Progress Notes (Signed)
Acute Office Visit  Subjective:    Patient ID: Duane [name], male    DOB: May 09, 1960, 58 y.o.   MRN: [id number]  Chief Complaint  Patient presents with  . Abdominal Pain    x 2day     HPI Patient is in today for ongoing heartburn.  Has had in the past. Notes a poor diet recently. Onset was around 2 days ago, it has persisted. Not worsening or improving. He has taken OTCs without any effect. No hemoptysis or melena. No NVD.   No other symptoms at this time.   Notes that his pain management provider requested that he stop taking ambien. He is seeking something to help with his sleep - believes he has tried trazodone in the past, willing to try again despite mixed effect at last trial. Does not sleep well d/t anxiety related to his job.   Past Medical History:  Diagnosis Date  . Arthritis   . Gout   . Hypertension     Past Surgical History:  Procedure Laterality Date  . ELBOW SURGERY    . FOOT FASCIOTOMY     X 5  . HEMORRHOID SURGERY     X2, Dr. Arnoldo Morale  . NECK EXPLORATION    . SHOULDER ARTHROTOMY     multiple     Family History  Problem Relation Age of Onset  . Diabetes Mother   . COPD Father   . Colon cancer Neg Hx     Social History   Socioeconomic History  . Marital status: Single    Spouse name: Not on file  . Number of children: 0  . Years of education: Not on file  . Highest education level: Not on file  Occupational History  . Occupation: Chemical engineer: [employer]  Social Needs  . Financial resource strain: Not on file  . Food insecurity    Worry: Not on file    Inability: Not on file  . Transportation needs    Medical: Not on file    Non-medical: Not on file  Tobacco Use  . Smoking status: Never Smoker  . Smokeless tobacco: Never Used  Substance and Sexual Activity  . Alcohol use: No  . Drug use: No  . Sexual activity: Not Currently  Lifestyle  . Physical activity    Days per week: Not on file    Minutes per  session: Not on file  . Stress: Not on file  Relationships  . Social Herbalist on phone: Not on file    Gets together: Not on file    Attends religious service: Not on file    Active member of club or organization: Not on file    Attends meetings of clubs or organizations: Not on file    Relationship status: Not on file  . Intimate partner violence    Fear of current or ex partner: Not on file    Emotionally abused: Not on file    Physically abused: Not on file    Forced sexual activity: Not on file  Other Topics Concern  . Not on file  Social History Narrative  . Not on file    Outpatient Medications Prior to Visit  Medication Sig Dispense Refill  . acetaminophen (TYLENOL) 650 MG CR tablet Take 1,300 mg by mouth every 8 (eight) hours as needed for pain.    Marland Kitchen diclofenac (VOLTAREN) 75 MG EC tablet Take 1 tablet (75 mg total)  by mouth 2 (two) times daily. 50 tablet 2  . enalapril (VASOTEC) 10 MG tablet Take 1 tablet (10 mg total) by mouth daily. TAKE (1) TABLET BY MOUTH ONCE DAILY. 90 tablet 3  . indomethacin (INDOCIN) 50 MG capsule Take 1 capsule (50 mg total) by mouth 3 (three) times daily as needed. 60 capsule 3  . oxyCODONE (ROXICODONE) 15 MG immediate release tablet Take 15 mg by mouth every 4 (four) hours as needed for pain.    . tadalafil (CIALIS) 20 MG tablet Take 0.5-1 tablets (10-20 mg total) by mouth every other day as needed for erectile dysfunction. 10 tablet 11  . zolpidem (AMBIEN) 10 MG tablet Take 1 tablet (10 mg total) by mouth at bedtime as needed for sleep. 30 tablet 2  . HYDROcodone-acetaminophen (NORCO/VICODIN) 5-325 MG tablet Take 1 tablet by mouth every 6 (six) hours as needed for moderate pain.    . promethazine (PHENERGAN) 25 MG tablet Take 0.5 tablets (12.5 mg total) by mouth every 6 (six) hours as needed for nausea. 60 tablet 3   No facility-administered medications prior to visit.     Allergies  Allergen Reactions  . Codeine   . Escitalopram  Other (See Comments)    Patient states it caused night terrors  . Hydrocodone Nausea And Vomiting  . Oxycodone Nausea And Vomiting  . Penicillins Nausea Only    .Has patient had a PCN reaction causing immediate rash, facial/tongue/throat swelling, SOB or lightheadedness with hypotension: No Has patient had a PCN reaction causing severe rash involving mucus membranes or skin necrosis: No  Has patient had a PCN reaction that required hospitalization: No Has patient had a PCN reaction occurring within the last 10 years: No If all of the above answers are "NO", then may proceed with Cephalosporin use.   . Trazodone Other (See Comments)    Increased BP and HR per patient  . Levofloxacin Rash    Review of Systems  Constitutional: Negative.   HENT: Negative.   Eyes: Negative.   Respiratory: Negative.   Cardiovascular: Negative.   Gastrointestinal: Positive for abdominal pain and heartburn. Negative for blood in stool, constipation, diarrhea, melena, nausea and vomiting.  Genitourinary: Negative.   Musculoskeletal: Negative.   Skin: Negative.   Neurological: Negative.   Endo/Heme/Allergies: Negative.   Psychiatric/Behavioral: Negative.        Objective:    Physical Exam  Constitutional: He is oriented to person, place, and time. He appears well-developed and well-nourished. No distress.  Cardiovascular: Normal rate and regular rhythm.  Pulmonary/Chest: Effort normal. No respiratory distress.  Abdominal: Soft. Bowel sounds are normal. He exhibits no distension. There is no abdominal tenderness.  Neurological: He is alert and oriented to person, place, and time.  Skin: Skin is warm and dry. No rash noted. He is not diaphoretic. No erythema. No pallor.  Psychiatric: He has a normal mood and affect. His behavior is normal. Judgment and thought content normal.  Nursing note and vitals reviewed.   BP 126/80   Pulse 73   Temp 98 F (36.7 C) (Oral)   Resp 16   Wt 159 lb (72.1 kg)    SpO2 97%   BMI 22.71 kg/m  Wt Readings from Last 3 Encounters:  11/29/18 159 lb (72.1 kg)  11/17/18 159 lb (72.1 kg)  09/04/18 160 lb (72.6 kg)    Health Maintenance Due  Topic Date Due  . TETANUS/TDAP  06/17/1981  . COLONOSCOPY  06/17/2012    There are no preventive  care reminders to display for this patient.   Lab Results  Component Value Date   TSH 1.350 08/24/2016   Lab Results  Component Value Date   WBC 9.5 11/29/2018   HGB 14.9 11/29/2018   HCT 45.1 11/29/2018   MCV 90 11/29/2018   PLT 315 11/29/2018   Lab Results  Component Value Date   NA 139 11/29/2018   K 4.6 11/29/2018   CO2 26 11/29/2018   GLUCOSE 262 (H) 11/29/2018   BUN 15 11/29/2018   CREATININE 1.30 (H) 11/29/2018   BILITOT 0.4 11/29/2018   ALKPHOS 100 11/29/2018   AST 17 11/29/2018   ALT 21 11/29/2018   PROT 7.3 11/29/2018   ALBUMIN 4.2 11/29/2018   CALCIUM 9.2 11/29/2018   ANIONGAP 9 08/27/2016   Lab Results  Component Value Date   CHOL 159 11/29/2018   Lab Results  Component Value Date   HDL 44 11/29/2018   Lab Results  Component Value Date   LDLCALC 87 11/29/2018   Lab Results  Component Value Date   TRIG 161 (H) 11/29/2018   Lab Results  Component Value Date   CHOLHDL 3.6 11/29/2018   Lab Results  Component Value Date   HGBA1C 6.3 (H) 08/24/2016       Assessment & Plan:   Problem List Items Addressed This Visit      Other   Insomnia   Relevant Medications   traZODone (DESYREL) 50 MG tablet    Other Visit Diagnoses    Heartburn    -  Primary   Relevant Medications   esomeprazole (NEXIUM) 40 MG capsule   sucralfate (CARAFATE) 1 g tablet   Other Relevant Orders   CBC (Completed)   Comprehensive metabolic panel (Completed)   Lipid screening       Relevant Orders   Lipid panel (Completed)       Meds ordered this encounter  Medications  . DISCONTD: esomeprazole (NEXIUM) 40 MG capsule    Sig: Take 1 capsule (40 mg total) by mouth daily.    Dispense:   90 capsule    Refill:  0    Order Specific Question:   Supervising Provider    Answer:   Delia Chimes A [id number]  . DISCONTD: traZODone (DESYREL) 50 MG tablet    Sig: Take 0.5-1 tablets (25-50 mg total) by mouth at bedtime as needed for sleep.    Dispense:  30 tablet    Refill:  3    Order Specific Question:   Supervising Provider    Answer:   Delia Chimes A [id number]  . esomeprazole (NEXIUM) 40 MG capsule    Sig: Take 1 capsule (40 mg total) by mouth daily.    Dispense:  30 capsule    Refill:  3    Generic ok    Order Specific Question:   Supervising Provider    Answer:   Delia Chimes A [id number]  . traZODone (DESYREL) 50 MG tablet    Sig: Take 0.5-1 tablets (25-50 mg total) by mouth at bedtime as needed for sleep.    Dispense:  90 tablet    Refill:  0    Order Specific Question:   Supervising Provider    Answer:   Delia Chimes A [id number]  . sucralfate (CARAFATE) 1 g tablet    Sig: Take 1 tablet (1 g total) by mouth 4 (four) times daily -  with meals and at bedtime.    Dispense:  40 tablet    Refill:  0    Order Specific Question:   Supervising Provider    Answer:   Forrest Moron [id number]   PLAN  Will start esomeprazole 20mg  PO qd before first meal of day  Given sucralfate 1g tablet to take up to 4 times daily for symptom relief  Trazodone 25-50mg  PO qhs for sleep disturbance. May increase to 100mg  PO qhs if desired effect has not been reached  Return to clinic for med check if interested in continuing on trazodone or interested in switching to alternative agent  If GERD continues or worsens, please call office. Will consider GI referral.  Patient encouraged to call clinic with any questions, comments, or concerns.    Maximiano Coss, NP

## 2018-12-05 ENCOUNTER — Ambulatory Visit: Payer: Medicare Other | Admitting: Registered Nurse

## 2018-12-14 ENCOUNTER — Other Ambulatory Visit: Payer: Self-pay

## 2018-12-14 ENCOUNTER — Ambulatory Visit (INDEPENDENT_AMBULATORY_CARE_PROVIDER_SITE_OTHER): Payer: Medicare Other | Admitting: Podiatry

## 2018-12-14 ENCOUNTER — Encounter: Payer: Self-pay | Admitting: Podiatry

## 2018-12-14 ENCOUNTER — Ambulatory Visit (INDEPENDENT_AMBULATORY_CARE_PROVIDER_SITE_OTHER): Payer: Medicare Other

## 2018-12-14 DIAGNOSIS — M722 Plantar fascial fibromatosis: Secondary | ICD-10-CM

## 2018-12-14 DIAGNOSIS — M779 Enthesopathy, unspecified: Secondary | ICD-10-CM | POA: Diagnosis not present

## 2018-12-14 NOTE — Progress Notes (Signed)
Subjective:   Patient ID: Duane [name], male   DOB: 58 y.o.   MRN: [id number]   HPI Patient states the forefoot is doing well with the bottom outside of the left arch being inflamed and tender   ROS      Objective:  Physical Exam  Neurovascular status intact with patient's forefoot seeming to be improved from previous treatment with discomfort in the plantar lateral aspect of the fascia     Assessment:  2 different problems with what appears to be lateral band fasciitis left and also capsulitis left which is improving     Plan:  H&P reviewed both conditions and for the capsulitis do not recommend further treatment but may be necessary 1 point.  Today I did go ahead and I did sterile prep and injected the lateral fascial band 3 mg Kenalog 5 mg Xylocaine and advised on supportive shoes and reappoint as symptoms indicate

## 2018-12-18 ENCOUNTER — Encounter: Payer: Self-pay | Admitting: Registered Nurse

## 2018-12-18 ENCOUNTER — Other Ambulatory Visit: Payer: Self-pay

## 2018-12-18 ENCOUNTER — Ambulatory Visit (INDEPENDENT_AMBULATORY_CARE_PROVIDER_SITE_OTHER): Payer: Medicare Other | Admitting: Registered Nurse

## 2018-12-18 ENCOUNTER — Telehealth: Payer: Self-pay | Admitting: Registered Nurse

## 2018-12-18 VITALS — BP 167/97 | HR 95 | Temp 97.9°F | Resp 12 | Ht 69.0 in | Wt 159.0 lb

## 2018-12-18 DIAGNOSIS — G4700 Insomnia, unspecified: Secondary | ICD-10-CM

## 2018-12-18 DIAGNOSIS — F411 Generalized anxiety disorder: Secondary | ICD-10-CM | POA: Diagnosis not present

## 2018-12-18 MED ORDER — BUSPIRONE HCL 10 MG PO TABS: 10.0000 mg | ORAL_TABLET | Freq: Three times a day (TID) | ORAL | 0 refills | Status: DC

## 2018-12-18 MED ORDER — DOXEPIN HCL 6 MG PO TABS: 6.0000 mg | ORAL_TABLET | Freq: Every day | ORAL | 0 refills | Status: DC

## 2018-12-18 NOTE — Progress Notes (Signed)
Established Patient Office Visit  Subjective:  Patient ID: Duane [name], male    DOB: Mar 13, 1960  Age: 58 y.o. MRN: [id number]  CC:  Chief Complaint  Patient presents with  . Follow-up    Pt stated--heart burn medication--are working as long taking on time. But Rx Trazadone--not helping at all.    HPI Duane [name] presents for med check  He was started on the trazodone 50mg  PO qhs for sleep at his last visit. States that this has not been effective. He has been taking more than the recommended dose, reporting 250mg  at max.   He states he is getting around 4-5 hours of inconsistent sleep each night. He is reporting intense anxiety throughout the day into the night. His job is reportedly very stressful.   Also reports he cannot take any controlled substances due to his agreement with his pain management provider.   Feels the esomeprazole is effective. Having success, but acknowledges that symptoms return rapidly when he forgets a dose.  Past Medical History:  Diagnosis Date  . Arthritis   . Gout   . Hypertension     Past Surgical History:  Procedure Laterality Date  . ELBOW SURGERY    . FOOT FASCIOTOMY     X 5  . HEMORRHOID SURGERY     X2, Dr. Arnoldo Morale  . NECK EXPLORATION    . SHOULDER ARTHROTOMY     multiple     Family History  Problem Relation Age of Onset  . Diabetes Mother   . COPD Father   . Colon cancer Neg Hx     Social History   Socioeconomic History  . Marital status: Single    Spouse name: Not on file  . Number of children: 0  . Years of education: Not on file  . Highest education level: Not on file  Occupational History  . Occupation: Chemical engineer: [employer]  Social Needs  . Financial resource strain: Not on file  . Food insecurity    Worry: Not on file    Inability: Not on file  . Transportation needs    Medical: Not on file    Non-medical: Not on file  Tobacco Use  . Smoking status: Never Smoker  .  Smokeless tobacco: Never Used  Substance and Sexual Activity  . Alcohol use: No  . Drug use: No  . Sexual activity: Not Currently  Lifestyle  . Physical activity    Days per week: Not on file    Minutes per session: Not on file  . Stress: Not on file  Relationships  . Social Herbalist on phone: Not on file    Gets together: Not on file    Attends religious service: Not on file    Active member of club or organization: Not on file    Attends meetings of clubs or organizations: Not on file    Relationship status: Not on file  . Intimate partner violence    Fear of current or ex partner: Not on file    Emotionally abused: Not on file    Physically abused: Not on file    Forced sexual activity: Not on file  Other Topics Concern  . Not on file  Social History Narrative  . Not on file    Outpatient Medications Prior to Visit  Medication Sig Dispense Refill  . acetaminophen (TYLENOL) 650 MG CR tablet Take 1,300 mg by mouth every 8 (  eight) hours as needed for pain.    Marland Kitchen diclofenac (VOLTAREN) 75 MG EC tablet Take 1 tablet (75 mg total) by mouth 2 (two) times daily. 50 tablet 2  . enalapril (VASOTEC) 10 MG tablet Take 1 tablet (10 mg total) by mouth daily. TAKE (1) TABLET BY MOUTH ONCE DAILY. 90 tablet 3  . esomeprazole (NEXIUM) 40 MG capsule Take 1 capsule (40 mg total) by mouth daily. 30 capsule 3  . indomethacin (INDOCIN) 50 MG capsule Take 1 capsule (50 mg total) by mouth 3 (three) times daily as needed. 60 capsule 3  . oxyCODONE (ROXICODONE) 15 MG immediate release tablet Take 15 mg by mouth every 4 (four) hours as needed for pain.    Marland Kitchen sucralfate (CARAFATE) 1 g tablet Take 1 tablet (1 g total) by mouth 4 (four) times daily -  with meals and at bedtime. 40 tablet 0  . tadalafil (CIALIS) 20 MG tablet Take 0.5-1 tablets (10-20 mg total) by mouth every other day as needed for erectile dysfunction. 10 tablet 11  . traZODone (DESYREL) 50 MG tablet Take 0.5-1 tablets (25-50 mg  total) by mouth at bedtime as needed for sleep. 90 tablet 0  . zolpidem (AMBIEN) 10 MG tablet Take 1 tablet (10 mg total) by mouth at bedtime as needed for sleep. 30 tablet 2  . promethazine (PHENERGAN) 25 MG tablet Take 0.5 tablets (12.5 mg total) by mouth every 6 (six) hours as needed for nausea. 60 tablet 3   No facility-administered medications prior to visit.     Allergies  Allergen Reactions  . Codeine   . Escitalopram Other (See Comments)    Patient states it caused night terrors  . Hydrocodone Nausea And Vomiting  . Oxycodone Nausea And Vomiting  . Penicillins Nausea Only    .Has patient had a PCN reaction causing immediate rash, facial/tongue/throat swelling, SOB or lightheadedness with hypotension: No Has patient had a PCN reaction causing severe rash involving mucus membranes or skin necrosis: No  Has patient had a PCN reaction that required hospitalization: No Has patient had a PCN reaction occurring within the last 10 years: No If all of the above answers are "NO", then may proceed with Cephalosporin use.   . Trazodone Other (See Comments)    Increased BP and HR per patient  . Levofloxacin Rash    ROS Review of Systems  Constitutional: Negative.   HENT: Negative.   Eyes: Negative.   Respiratory: Negative.   Cardiovascular: Negative.   Gastrointestinal: Negative.   Endocrine: Negative.   Genitourinary: Negative.   Musculoskeletal: Negative.   Skin: Negative.   Allergic/Immunologic: Negative.   Neurological: Negative.   Hematological: Negative.   Psychiatric/Behavioral: Positive for sleep disturbance.  All other systems reviewed and are negative.     Objective:    Physical Exam  Constitutional: He is oriented to person, place, and time. He appears well-developed and well-nourished. No distress.  Cardiovascular: Normal rate and regular rhythm.  Pulmonary/Chest: Effort normal. No respiratory distress.  Neurological: He is alert and oriented to person,  place, and time.  Skin: Skin is warm and dry. No rash noted. He is not diaphoretic. No erythema. No pallor.  Psychiatric: He has a normal mood and affect. His behavior is normal. Judgment and thought content normal.  Nursing note and vitals reviewed.   BP (!) 167/97   Pulse 95   Temp 97.9 F (36.6 C)   Resp 12   Ht 5\' 9"  (1.753 m)   Wt 159  lb (72.1 kg)   SpO2 98%   BMI 23.48 kg/m  Wt Readings from Last 3 Encounters:  12/18/18 159 lb (72.1 kg)  11/29/18 159 lb (72.1 kg)  11/17/18 159 lb (72.1 kg)     Health Maintenance Due  Topic Date Due  . TETANUS/TDAP  06/17/1981  . COLONOSCOPY  06/17/2012    There are no preventive care reminders to display for this patient.  Lab Results  Component Value Date   TSH 1.350 08/24/2016   Lab Results  Component Value Date   WBC 9.5 11/29/2018   HGB 14.9 11/29/2018   HCT 45.1 11/29/2018   MCV 90 11/29/2018   PLT 315 11/29/2018   Lab Results  Component Value Date   NA 139 11/29/2018   K 4.6 11/29/2018   CO2 26 11/29/2018   GLUCOSE 262 (H) 11/29/2018   BUN 15 11/29/2018   CREATININE 1.30 (H) 11/29/2018   BILITOT 0.4 11/29/2018   ALKPHOS 100 11/29/2018   AST 17 11/29/2018   ALT 21 11/29/2018   PROT 7.3 11/29/2018   ALBUMIN 4.2 11/29/2018   CALCIUM 9.2 11/29/2018   ANIONGAP 9 08/27/2016   Lab Results  Component Value Date   CHOL 159 11/29/2018   Lab Results  Component Value Date   HDL 44 11/29/2018   Lab Results  Component Value Date   LDLCALC 87 11/29/2018   Lab Results  Component Value Date   TRIG 161 (H) 11/29/2018   Lab Results  Component Value Date   CHOLHDL 3.6 11/29/2018   Lab Results  Component Value Date   HGBA1C 6.3 (H) 08/24/2016      Assessment & Plan:   Problem List Items Addressed This Visit      Other   Insomnia - Primary   Relevant Medications   Doxepin HCl 6 MG TABS    Other Visit Diagnoses    Anxiety state       Relevant Medications   busPIRone (BUSPAR) 10 MG tablet       Meds ordered this encounter  Medications  . Doxepin HCl 6 MG TABS    Sig: Take 1 tablet (6 mg total) by mouth at bedtime.    Dispense:  30 tablet    Refill:  0    Order Specific Question:   Supervising Provider    Answer:   Delia Chimes A [id number]  . busPIRone (BUSPAR) 10 MG tablet    Sig: Take 1 tablet (10 mg total) by mouth 3 (three) times daily.    Dispense:  90 tablet    Refill:  0    Order Specific Question:   Supervising Provider    Answer:   Forrest Moron [id number]    Follow-up: No follow-ups on file.   PLAN  Will trial doxepin 6mg  PO before bed for sleep aid  Will start buspirone 10mg  PO tid for anxiety  Will place psychiatry referral, as we've tried a number of medications and he has failed many of them  Patient encouraged to call clinic with any questions, comments, or concerns.  Maximiano Coss, NP

## 2018-12-18 NOTE — Telephone Encounter (Signed)
Copied from [place] [id number]. Topic: Appointment Scheduling - Scheduling Inquiry for Clinic >> Dec 15, 2018 12:28 PM Rainey Pines A wrote: Patient would like a callback to schedule follow up appt

## 2018-12-25 ENCOUNTER — Other Ambulatory Visit: Payer: Self-pay | Admitting: Registered Nurse

## 2018-12-29 ENCOUNTER — Other Ambulatory Visit: Payer: Self-pay

## 2018-12-29 ENCOUNTER — Encounter: Payer: Self-pay | Admitting: Registered Nurse

## 2018-12-29 ENCOUNTER — Ambulatory Visit (INDEPENDENT_AMBULATORY_CARE_PROVIDER_SITE_OTHER): Payer: Medicare Other | Admitting: Registered Nurse

## 2018-12-29 ENCOUNTER — Telehealth: Payer: Self-pay | Admitting: Podiatry

## 2018-12-29 VITALS — BP 142/98 | HR 94 | Temp 97.7°F | Ht 69.0 in | Wt 157.0 lb

## 2018-12-29 DIAGNOSIS — F5104 Psychophysiologic insomnia: Secondary | ICD-10-CM | POA: Diagnosis not present

## 2018-12-29 MED ORDER — QUETIAPINE FUMARATE 50 MG PO TABS: 50.0000 mg | ORAL_TABLET | Freq: Every day | ORAL | 0 refills | Status: DC

## 2018-12-29 NOTE — Progress Notes (Signed)
Acute Office Visit  Subjective:    Patient ID: Duane [name], male    DOB: 02/09/1960, 58 y.o.   MRN: [id number]  Chief Complaint  Patient presents with  . Follow-up    Pt stated ---last medicine is not working.    HPI Patient is in today for insomnia. Reports that he was unable to pick up doxepin because of price. Hoping to switch to another agent.   Has tried trazodone and melatonin in the past. Neither have worked. The only agent that has worked has been zolpidem, however, he cannot have this per an agreement with his pain management provider.   Has trouble falling asleep and staying asleep - works high stress job, gives him a lot of anxiety. In addition, he is in chronic pain.   He also requests that we fill out disability paperwork today - he is dealing with knee and foot injuries and is being managed by Dr. Ila Mcgill of [facility]  *PLEASE NOTE: FOR HISTORIES, THIS CHART IS DUE TO BE MERGED. HIS EXISTING CHART'S HISTORIES HAVE BEEN REVIEWED.*   Social History   Socioeconomic History  . Marital status: Unknown    Spouse name: Not on file  . Number of children: Not on file  . Years of education: Not on file  . Highest education level: Not on file  Occupational History  . Not on file  Tobacco Use  . Smoking status: Never Smoker  . Smokeless tobacco: Never Used  Substance and Sexual Activity  . Alcohol use: Never  . Drug use: Never  . Sexual activity: Not on file  Other Topics Concern  . Not on file  Social History Narrative  . Not on file   Social Determinants of Health   Financial Resource Strain:   . Difficulty of Paying Living Expenses: Not on file  Food Insecurity:   . Worried About Charity fundraiser in the Last Year: Not on file  . Ran Out of Food in the Last Year: Not on file  Transportation Needs:   . Lack of Transportation (Medical): Not on file  . Lack of Transportation (Non-Medical): Not on file  Physical Activity:   . Days of Exercise per Week:  Not on file  . Minutes of Exercise per Session: Not on file  Stress:   . Feeling of Stress : Not on file  Social Connections:   . Frequency of Communication with Friends and Family: Not on file  . Frequency of Social Gatherings with Friends and Family: Not on file  . Attends Religious Services: Not on file  . Active Member of Clubs or Organizations: Not on file  . Attends Archivist Meetings: Not on file  . Marital Status: Not on file  Intimate Partner Violence:   . Fear of Current or Ex-Partner: Not on file  . Emotionally Abused: Not on file  . Physically Abused: Not on file  . Sexually Abused: Not on file    Outpatient Medications Prior to Visit  Medication Sig Dispense Refill  . diclofenac (VOLTAREN) 75 MG EC tablet Take 75 mg by mouth 2 (two) times daily.    . enalapril (VASOTEC) 10 MG tablet Take 10 mg by mouth daily.    Marland Kitchen HYDROmorphone (DILAUDID) 4 MG tablet Take 4 mg by mouth every 4 (four) hours as needed.    . indomethacin (INDOCIN) 50 MG capsule Take 1 capsule (50 mg total) by mouth 2 (two) times daily with a meal. 60 capsule 2  .  ondansetron (ZOFRAN) 4 MG tablet Take 4 mg by mouth every 8 (eight) hours as needed.    . promethazine (PHENERGAN) 25 MG tablet Take 12.5 mg by mouth every 6 (six) hours as needed.     No facility-administered medications prior to visit.    Allergies  Allergen Reactions  . Penicillins     Review of Systems  Constitutional: Negative.   HENT: Negative.   Eyes: Negative.   Respiratory: Negative.   Cardiovascular: Negative.   Gastrointestinal: Negative.   Endocrine: Negative.   Genitourinary: Negative.   Musculoskeletal: Positive for arthralgias, back pain, gait problem and joint swelling.  Skin: Negative.   Allergic/Immunologic: Negative.   Hematological: Negative.   Psychiatric/Behavioral: Positive for sleep disturbance. Negative for agitation, behavioral problems, confusion, decreased concentration, dysphoric mood,  hallucinations, self-injury and suicidal ideas. The patient is nervous/anxious. The patient is not hyperactive.   All other systems reviewed and are negative.      Objective:    Physical Exam Vitals and nursing note reviewed.  Constitutional:      General: He is not in acute distress.    Appearance: Normal appearance. He is normal weight. He is not ill-appearing, toxic-appearing or diaphoretic.  Cardiovascular:     Rate and Rhythm: Normal rate and regular rhythm.  Pulmonary:     Effort: Pulmonary effort is normal. No respiratory distress.  Skin:    Capillary Refill: Capillary refill takes less than 2 seconds.  Neurological:     General: No focal deficit present.     Mental Status: He is alert and oriented to person, place, and time. Mental status is at baseline.  Psychiatric:        Mood and Affect: Mood normal.        Behavior: Behavior normal.        Thought Content: Thought content normal.        Judgment: Judgment normal.     BP (!) 142/98 (BP Location: Left Arm, Patient Position: Sitting, Cuff Size: Normal)   Pulse 94   Temp 97.7 F (36.5 C)   Ht '5\' 9"'  (1.753 m)   Wt 157 lb (71.2 kg)   SpO2 97%   BMI 23.18 kg/m  Wt Readings from Last 3 Encounters:  12/29/18 157 lb (71.2 kg)    Health Maintenance Due  Topic Date Due  . Hepatitis C Screening  25-Mar-1960  . HIV Screening  06/17/1977  . TETANUS/TDAP  06/17/1981  . COLONOSCOPY  06/17/2012  . INFLUENZA VACCINE  08/12/2018    There are no preventive care reminders to display for this patient.   No results found for: TSH No results found for: WBC, HGB, HCT, MCV, PLT No results found for: NA, K, CHLORIDE, CO2, GLUCOSE, BUN, CREATININE, BILITOT, ALKPHOS, AST, ALT, PROT, ALBUMIN, CALCIUM, ANIONGAP, EGFR, GFR No results found for: CHOL No results found for: HDL No results found for: LDLCALC No results found for: TRIG No results found for: CHOLHDL No results found for: HGBA1C     Assessment & Plan:   Problem  List Items Addressed This Visit    None    Visit Diagnoses    Psychophysiological insomnia    -  Primary   Relevant Medications   QUEtiapine (SEROQUEL) 50 MG tablet       Meds ordered this encounter  Medications  . QUEtiapine (SEROQUEL) 50 MG tablet    Sig: Take 1 tablet (50 mg total) by mouth at bedtime.    Dispense:  90 tablet  Refill:  0    Order Specific Question:   Supervising Provider    Answer:   Forrest Moron [id number]   PLAN  Will try seroqual. Encourage him to start with 64m nightly, but may increase weekly up to a max of 3039mnightly.  Filled out his paperwork per his request.  Patient encouraged to call clinic with any questions, comments, or concerns.   RiMaximiano CossNP

## 2019-01-01 ENCOUNTER — Encounter: Payer: Self-pay | Admitting: Registered Nurse

## 2019-01-01 NOTE — Telephone Encounter (Signed)
Left message for pt to call to discuss the work note from Dr. Paulla Dolly.

## 2019-01-01 NOTE — Telephone Encounter (Signed)
Patient wants to be taken out of work, Indefinite due to his foot pain. He said that he works at the [employer]. 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 This message was copied from a telephone message taken by Kristopher Glee on 12/29/2018 1:19pm.

## 2019-01-02 NOTE — Telephone Encounter (Signed)
I spoke with pt and informed of Dr. Mellody Drown statement that he could not be written out of work indefinitely, but to allow him to heal, letter for 2 weeks and to make an appt to discuss other options with Dr. Paulla Dolly. I told pt I would have the letter ready for pick up tomorrow and would have schedulers to call to set up an appt in about a 2 week timeframe. Pt agreed and states anytime for the appt would be fine and to leave a message on his phone.

## 2019-01-03 NOTE — Telephone Encounter (Signed)
Called patient and left voicemail with appt scheduled for 01/17/19 @ 9:45am

## 2019-01-17 ENCOUNTER — Encounter: Payer: Self-pay | Admitting: Podiatry

## 2019-01-17 ENCOUNTER — Other Ambulatory Visit: Payer: Self-pay

## 2019-01-17 ENCOUNTER — Ambulatory Visit (INDEPENDENT_AMBULATORY_CARE_PROVIDER_SITE_OTHER): Payer: Medicare Other | Admitting: Podiatry

## 2019-01-17 DIAGNOSIS — M722 Plantar fascial fibromatosis: Secondary | ICD-10-CM

## 2019-01-17 NOTE — Progress Notes (Signed)
Subjective:   Patient ID: Duane [name], male   DOB: 59 y.o.   MRN: [id number]   HPI Patient states my mid arch left is still bothering me and it seemed like it improved and I do have a boot at home and was not sure if I should wear   ROS      Objective:  Physical Exam  Neurovascular status intact with significant mid arch pain lateral side left that is very painful when palpated     Assessment:  Mid arch fasciitis left     Plan:  H&P reviewed condition went ahead from the lateral side injected the fascia 3 mg Dexasone Kenalog 5 mg Xylocaine and he will wear his air fracture walker for the next 3 weeks and be seen back 4 weeks

## 2019-01-25 ENCOUNTER — Telehealth: Payer: Self-pay | Admitting: Podiatry

## 2019-01-25 NOTE — Telephone Encounter (Signed)
I'm calling to see if I can get a copy of my ov note from 01/17/19 visit. I would like to provide it to both my PCP and another specialist that is treating me. Please call me back at [phone]. Thank you.

## 2019-01-29 NOTE — Telephone Encounter (Signed)
Left voicemail letting pt know he would need to fill out and sign a medical records release form authorizing [place] to release his ov note from January 2020 to him. Told him he could come by the office to fill out and sign the form and we could print his ov note for him then. Told him to call us with any other questions.

## 2019-02-12 ENCOUNTER — Ambulatory Visit: Payer: Medicare Other | Admitting: Podiatry

## 2019-02-16 ENCOUNTER — Ambulatory Visit: Payer: Medicare Other | Admitting: Registered Nurse

## 2019-02-23 ENCOUNTER — Other Ambulatory Visit: Payer: Self-pay

## 2019-02-23 ENCOUNTER — Ambulatory Visit (INDEPENDENT_AMBULATORY_CARE_PROVIDER_SITE_OTHER): Payer: Medicare Other | Admitting: Registered Nurse

## 2019-02-23 ENCOUNTER — Encounter: Payer: Self-pay | Admitting: Registered Nurse

## 2019-02-23 VITALS — BP 141/96 | HR 102 | Temp 97.8°F | Ht 69.0 in | Wt 157.2 lb

## 2019-02-23 DIAGNOSIS — R110 Nausea: Secondary | ICD-10-CM

## 2019-02-23 DIAGNOSIS — F5104 Psychophysiologic insomnia: Secondary | ICD-10-CM

## 2019-02-23 MED ORDER — QUETIAPINE FUMARATE 50 MG PO TABS: 50.0000 mg | ORAL_TABLET | Freq: Every day | ORAL | 1 refills | Status: DC

## 2019-02-23 MED ORDER — PROMETHAZINE HCL 25 MG PO TABS: 12.5000 mg | ORAL_TABLET | Freq: Four times a day (QID) | ORAL | 3 refills | Status: DC | PRN

## 2019-02-23 NOTE — Patient Instructions (Signed)
° ° ° °  If you have lab work done today you will be contacted with your lab results within the next 2 weeks.  If you have not heard from us then please contact us. The fastest way to get your results is to register for My Chart. ° ° °IF you received an x-ray today, you will receive an invoice from [facility]. Please contact [facility] at [phone] with questions or concerns regarding your invoice.  ° °IF you received labwork today, you will receive an invoice from [facility]. Please contact [facility] at [phone] with questions or concerns regarding your invoice.  ° °Our billing staff will not be able to assist you with questions regarding bills from these companies. ° °You will be contacted with the lab results as soon as they are available. The fastest way to get your results is to activate your My Chart account. Instructions are located on the last page of this paperwork. If you have not heard from us regarding the results in 2 weeks, please contact this office. °  ° ° ° °

## 2019-03-05 NOTE — Progress Notes (Signed)
Acute Office Visit  Subjective:    Patient ID: Duane [name], male    DOB: 01-27-1960, 59 y.o.   MRN: [id number]  Chief Complaint  Patient presents with  . Follow-up    follow up for chronic conditions . had a fall and slipped and injury back and hip .  Marland Kitchen Medication Refill    promethazine hcl 20 mg and quetiapine    HPI Patient is in today for follow up and medication refill.  States that he recently fell, injuring his hip and back. He is following with his orthopedic specialist for this. He is already followed by pain management.  Requesting refill on promethazine and quetiapine. Notes that the quetiapine has worked well as a sleep aid - this is good news, as he has tried multiple agents in the past with no success and due to a controlled substance contract with his pain management provider, cannot receive controlled substance through our office.  Otherwise, has some paperwork to fill out regarding work leave. We filled this out together during the visit.  Past Medical History:  Diagnosis Date  . Arthritis   . Gout   . Hypertension     Past Surgical History:  Procedure Laterality Date  . ELBOW SURGERY    . FOOT FASCIOTOMY     X 5  . HEMORRHOID SURGERY     X2, Dr. Arnoldo Morale  . NECK EXPLORATION    . SHOULDER ARTHROTOMY     multiple     Family History  Problem Relation Age of Onset  . Diabetes Mother   . COPD Father   . Colon cancer Neg Hx     Social History   Socioeconomic History  . Marital status: Unknown    Spouse name: Not on file  . Number of children: 0  . Years of education: Not on file  . Highest education level: Not on file  Occupational History  . Occupation: Chemical engineer: [employer]  Tobacco Use  . Smoking status: Never Smoker  . Smokeless tobacco: Never Used  Substance and Sexual Activity  . Alcohol use: Never  . Drug use: Never  . Sexual activity: Not Currently  Other Topics Concern  . Not on file  Social  History Narrative   ** Merged History Encounter **       Social Determinants of Health   Financial Resource Strain:   . Difficulty of Paying Living Expenses: Not on file  Food Insecurity:   . Worried About Charity fundraiser in the Last Year: Not on file  . Ran Out of Food in the Last Year: Not on file  Transportation Needs:   . Lack of Transportation (Medical): Not on file  . Lack of Transportation (Non-Medical): Not on file  Physical Activity:   . Days of Exercise per Week: Not on file  . Minutes of Exercise per Session: Not on file  Stress:   . Feeling of Stress : Not on file  Social Connections:   . Frequency of Communication with Friends and Family: Not on file  . Frequency of Social Gatherings with Friends and Family: Not on file  . Attends Religious Services: Not on file  . Active Member of Clubs or Organizations: Not on file  . Attends Archivist Meetings: Not on file  . Marital Status: Not on file  Intimate Partner Violence:   . Fear of Current or Ex-Partner: Not on file  . Emotionally Abused:  Not on file  . Physically Abused: Not on file  . Sexually Abused: Not on file    Outpatient Medications Prior to Visit  Medication Sig Dispense Refill  . acetaminophen (TYLENOL) 650 MG CR tablet Take 1,300 mg by mouth every 8 (eight) hours as needed for pain.    . busPIRone (BUSPAR) 10 MG tablet Take 1 tablet (10 mg total) by mouth 3 (three) times daily. 90 tablet 0  . diclofenac (VOLTAREN) 75 MG EC tablet Take 75 mg by mouth 2 (two) times daily.    . diclofenac (VOLTAREN) 75 MG EC tablet Take 1 tablet (75 mg total) by mouth 2 (two) times daily. 50 tablet 2  . Doxepin HCl 6 MG TABS Take 1 tablet (6 mg total) by mouth at bedtime. 30 tablet 0  . enalapril (VASOTEC) 10 MG tablet Take 10 mg by mouth daily.    . enalapril (VASOTEC) 10 MG tablet Take 1 tablet (10 mg total) by mouth daily. TAKE (1) TABLET BY MOUTH ONCE DAILY. 90 tablet 3  . esomeprazole (NEXIUM) 40 MG  capsule Take 1 capsule (40 mg total) by mouth daily. 30 capsule 3  . HYDROmorphone (DILAUDID) 4 MG tablet Take 4 mg by mouth every 4 (four) hours as needed.    . indomethacin (INDOCIN) 50 MG capsule Take 1 capsule (50 mg total) by mouth 2 (two) times daily with a meal. 60 capsule 2  . indomethacin (INDOCIN) 50 MG capsule Take 1 capsule (50 mg total) by mouth 3 (three) times daily as needed. 60 capsule 3  . ondansetron (ZOFRAN) 4 MG tablet Take 4 mg by mouth every 8 (eight) hours as needed.    Marland Kitchen oxyCODONE (ROXICODONE) 15 MG immediate release tablet Take 15 mg by mouth every 4 (four) hours as needed for pain.    Marland Kitchen sucralfate (CARAFATE) 1 g tablet Take 1 tablet (1 g total) by mouth 4 (four) times daily -  with meals and at bedtime. 40 tablet 0  . tadalafil (CIALIS) 20 MG tablet Take 0.5-1 tablets (10-20 mg total) by mouth every other day as needed for erectile dysfunction. 10 tablet 11  . traZODone (DESYREL) 50 MG tablet Take 0.5-1 tablets (25-50 mg total) by mouth at bedtime as needed for sleep. 90 tablet 0  . promethazine (PHENERGAN) 25 MG tablet Take 12.5 mg by mouth every 6 (six) hours as needed.    Marland Kitchen QUEtiapine (SEROQUEL) 50 MG tablet Take 1 tablet (50 mg total) by mouth at bedtime. 90 tablet 0  . promethazine (PHENERGAN) 25 MG tablet Take 0.5 tablets (12.5 mg total) by mouth every 6 (six) hours as needed for nausea. 60 tablet 3  . zolpidem (AMBIEN) 10 MG tablet Take 1 tablet (10 mg total) by mouth at bedtime as needed for sleep. 30 tablet 2   No facility-administered medications prior to visit.    Allergies  Allergen Reactions  . Codeine   . Escitalopram Other (See Comments)    Patient states it caused night terrors  . Hydrocodone Nausea And Vomiting  . Oxycodone Nausea And Vomiting  . Penicillins Nausea Only    .Has patient had a PCN reaction causing immediate rash, facial/tongue/throat swelling, SOB or lightheadedness with hypotension: No Has patient had a PCN reaction causing severe  rash involving mucus membranes or skin necrosis: No  Has patient had a PCN reaction that required hospitalization: No Has patient had a PCN reaction occurring within the last 10 years: No If all of the above answers are "NO",  then may proceed with Cephalosporin use.   Marland Kitchen Penicillins   . Trazodone Other (See Comments)    Increased BP and HR per patient  . Levofloxacin Rash    Review of Systems  Constitutional: Negative.   HENT: Negative.   Eyes: Negative.   Respiratory: Negative.   Cardiovascular: Negative.   Gastrointestinal: Negative.   Endocrine: Negative.   Genitourinary: Negative.   Musculoskeletal: Positive for arthralgias, back pain and gait problem. Negative for joint swelling, myalgias, neck pain and neck stiffness.       Positives r/t recent mechanical fall  Skin: Negative.   Allergic/Immunologic: Negative.   Hematological: Negative.   Psychiatric/Behavioral: Negative.   All other systems reviewed and are negative.      Objective:    Physical Exam Vitals and nursing note reviewed.  Constitutional:      General: He is not in acute distress.    Appearance: Normal appearance. He is normal weight. He is not ill-appearing, toxic-appearing or diaphoretic.  Cardiovascular:     Rate and Rhythm: Normal rate and regular rhythm.  Pulmonary:     Effort: Pulmonary effort is normal. No respiratory distress.  Skin:    Capillary Refill: Capillary refill takes less than 2 seconds.  Neurological:     General: No focal deficit present.     Mental Status: He is alert and oriented to person, place, and time. Mental status is at baseline.  Psychiatric:        Mood and Affect: Mood normal.        Behavior: Behavior normal.        Thought Content: Thought content normal.        Judgment: Judgment normal.     BP (!) 141/96   Pulse (!) 102   Temp 97.8 F (36.6 C) (Temporal)   Ht 5\' 9"  (1.753 m)   Wt 157 lb 3.2 oz (71.3 kg)   SpO2 99%   BMI 23.21 kg/m  Wt Readings from  Last 3 Encounters:  02/23/19 157 lb 3.2 oz (71.3 kg)  12/29/18 157 lb (71.2 kg)  12/18/18 159 lb (72.1 kg)    Health Maintenance Due  Topic Date Due  . COLONOSCOPY  06/17/2012    There are no preventive care reminders to display for this patient.   Lab Results  Component Value Date   TSH 1.350 08/24/2016   Lab Results  Component Value Date   WBC 9.5 11/29/2018   HGB 14.9 11/29/2018   HCT 45.1 11/29/2018   MCV 90 11/29/2018   PLT 315 11/29/2018   Lab Results  Component Value Date   NA 139 11/29/2018   K 4.6 11/29/2018   CO2 26 11/29/2018   GLUCOSE 262 (H) 11/29/2018   BUN 15 11/29/2018   CREATININE 1.30 (H) 11/29/2018   BILITOT 0.4 11/29/2018   ALKPHOS 100 11/29/2018   AST 17 11/29/2018   ALT 21 11/29/2018   PROT 7.3 11/29/2018   ALBUMIN 4.2 11/29/2018   CALCIUM 9.2 11/29/2018   ANIONGAP 9 08/27/2016   Lab Results  Component Value Date   CHOL 159 11/29/2018   Lab Results  Component Value Date   HDL 44 11/29/2018   Lab Results  Component Value Date   LDLCALC 87 11/29/2018   Lab Results  Component Value Date   TRIG 161 (H) 11/29/2018   Lab Results  Component Value Date   CHOLHDL 3.6 11/29/2018   Lab Results  Component Value Date   HGBA1C 6.3 (H) 08/24/2016  Assessment & Plan:   Problem List Items Addressed This Visit      Other   Insomnia   Relevant Medications   QUEtiapine (SEROQUEL) 50 MG tablet   Nausea without vomiting - Primary   Relevant Medications   promethazine (PHENERGAN) 25 MG tablet       Meds ordered this encounter  Medications  . promethazine (PHENERGAN) 25 MG tablet    Sig: Take 0.5 tablets (12.5 mg total) by mouth every 6 (six) hours as needed.    Dispense:  90 tablet    Refill:  3    Order Specific Question:   Supervising Provider    Answer:   Delia Chimes A [id number]  . QUEtiapine (SEROQUEL) 50 MG tablet    Sig: Take 1 tablet (50 mg total) by mouth at bedtime.    Dispense:  90 tablet    Refill:  1     Order Specific Question:   Supervising Provider    Answer:   Forrest Moron [id number]   PLAN  Refill promethazine and quetiapine   Filled out forms  Follow up in 6 mos for check on chronic conditions  Patient encouraged to call clinic with any questions, comments, or concerns.  Maximiano Coss, NP

## 2019-03-14 ENCOUNTER — Ambulatory Visit: Payer: Medicare Other | Admitting: Registered Nurse

## 2019-03-26 ENCOUNTER — Ambulatory Visit: Payer: Medicare Other | Admitting: Registered Nurse

## 2019-03-30 ENCOUNTER — Ambulatory Visit: Payer: Medicare Other | Admitting: Registered Nurse

## 2019-09-18 ENCOUNTER — Other Ambulatory Visit: Payer: Self-pay

## 2019-09-18 ENCOUNTER — Encounter: Payer: Self-pay | Admitting: Registered Nurse

## 2019-09-18 ENCOUNTER — Ambulatory Visit (INDEPENDENT_AMBULATORY_CARE_PROVIDER_SITE_OTHER): Payer: Medicare Other | Admitting: Registered Nurse

## 2019-09-18 VITALS — BP 135/88 | HR 87 | Temp 97.6°F | Resp 18 | Ht 69.0 in | Wt 150.8 lb

## 2019-09-18 DIAGNOSIS — G894 Chronic pain syndrome: Secondary | ICD-10-CM

## 2019-09-18 DIAGNOSIS — R110 Nausea: Secondary | ICD-10-CM | POA: Diagnosis not present

## 2019-09-18 DIAGNOSIS — I10 Essential (primary) hypertension: Secondary | ICD-10-CM

## 2019-09-18 MED ORDER — PROMETHAZINE HCL 25 MG PO TABS: 12.5000 mg | ORAL_TABLET | Freq: Four times a day (QID) | ORAL | 3 refills | Status: DC | PRN

## 2019-09-18 MED ORDER — DICLOFENAC SODIUM 75 MG PO TBEC: 75.0000 mg | DELAYED_RELEASE_TABLET | Freq: Two times a day (BID) | ORAL | 3 refills | Status: DC

## 2019-09-18 MED ORDER — ENALAPRIL MALEATE 20 MG PO TABS: 20.0000 mg | ORAL_TABLET | Freq: Every day | ORAL | 3 refills | Status: DC

## 2019-09-18 NOTE — Patient Instructions (Signed)
° ° ° °  If you have lab work done today you will be contacted with your lab results within the next 2 weeks.  If you have not heard from us then please contact us. The fastest way to get your results is to register for My Chart. ° ° °IF you received an x-ray today, you will receive an invoice from [facility]. Please contact [facility] at [phone] with questions or concerns regarding your invoice.  ° °IF you received labwork today, you will receive an invoice from [facility]. Please contact [facility] at [phone] with questions or concerns regarding your invoice.  ° °Our billing staff will not be able to assist you with questions regarding bills from these companies. ° °You will be contacted with the lab results as soon as they are available. The fastest way to get your results is to activate your My Chart account. Instructions are located on the last page of this paperwork. If you have not heard from us regarding the results in 2 weeks, please contact this office. °  ° ° ° °

## 2019-09-18 NOTE — Progress Notes (Signed)
Established Patient Office Visit  Subjective:  Patient ID: Duane [name], male    DOB: [birth year]  Age: 59 y.o. MRN: [id number]  CC:  Chief Complaint  Patient presents with  . Medication Refill    Patient states he is here for a medication refill on a few medications and would like to discuss a dosage change.    HPI Duane [name] presents for med refill  Enalapril: has been taking 20mg  PO qd at home given high bp readings at home. Was prescribed 10mg  but told he could increase this to 20- no AEs or side effects. Denies CV symptoms at this time   Promethizine: taking 12.5mg  PO q8h PRN for nausea. Uses occ. With good effect. Has supply on hand but no refills left at this time. No AEs, no concerns  Indomethacin: had used for gout in the past, now uses 50mg  PO tid PRN for adjunct to chronic opioids for pain relief. Curious as to alternatives if something may be better.  No other concerns or complaints. Feeling well at this time.  Past Medical History:  Diagnosis Date  . Arthritis   . Gout   . Hypertension     Past Surgical History:  Procedure Laterality Date  . ELBOW SURGERY    . FOOT FASCIOTOMY     X 5  . HEMORRHOID SURGERY     X2, Dr. Arnoldo Morale  . NECK EXPLORATION    . SHOULDER ARTHROTOMY     multiple     Family History  Problem Relation Age of Onset  . Diabetes Mother   . COPD Father   . Colon cancer Neg Hx     Social History   Socioeconomic History  . Marital status: Unknown    Spouse name: Not on file  . Number of children: 0  . Years of education: Not on file  . Highest education level: Not on file  Occupational History  . Occupation: Chemical engineer: [employer]  Tobacco Use  . Smoking status: Never Smoker  . Smokeless tobacco: Never Used  Vaping Use  . Vaping Use: Never used  Substance and Sexual Activity  . Alcohol use: Never  . Drug use: Never  . Sexual activity: Not Currently  Other Topics Concern  . Not on file    Social History Narrative   ** Merged History Encounter **       Social Determinants of Health   Financial Resource Strain:   . Difficulty of Paying Living Expenses: Not on file  Food Insecurity:   . Worried About Charity fundraiser in the Last Year: Not on file  . Ran Out of Food in the Last Year: Not on file  Transportation Needs:   . Lack of Transportation (Medical): Not on file  . Lack of Transportation (Non-Medical): Not on file  Physical Activity:   . Days of Exercise per Week: Not on file  . Minutes of Exercise per Session: Not on file  Stress:   . Feeling of Stress : Not on file  Social Connections:   . Frequency of Communication with Friends and Family: Not on file  . Frequency of Social Gatherings with Friends and Family: Not on file  . Attends Religious Services: Not on file  . Active Member of Clubs or Organizations: Not on file  . Attends Archivist Meetings: Not on file  . Marital Status: Not on file  Intimate Partner Violence:   . Fear  of Current or Ex-Partner: Not on file  . Emotionally Abused: Not on file  . Physically Abused: Not on file  . Sexually Abused: Not on file    Outpatient Medications Prior to Visit  Medication Sig Dispense Refill  . acetaminophen (TYLENOL) 650 MG CR tablet Take 1,300 mg by mouth every 8 (eight) hours as needed for pain.    Marland Kitchen HYDROmorphone (DILAUDID) 4 MG tablet Take 4 mg by mouth every 4 (four) hours as needed.    . indomethacin (INDOCIN) 50 MG capsule Take 1 capsule (50 mg total) by mouth 3 (three) times daily as needed. 60 capsule 3  . enalapril (VASOTEC) 10 MG tablet Take 10 mg by mouth daily.    . promethazine (PHENERGAN) 25 MG tablet Take 0.5 tablets (12.5 mg total) by mouth every 6 (six) hours as needed. 90 tablet 3  . oxyCODONE (ROXICODONE) 15 MG immediate release tablet Take 15 mg by mouth every 4 (four) hours as needed for pain. (Patient not taking: Reported on 09/18/2019)    . QUEtiapine (SEROQUEL) 50 MG tablet  Take 1 tablet (50 mg total) by mouth at bedtime. (Patient not taking: Reported on 09/18/2019) 90 tablet 1  . zolpidem (AMBIEN) 10 MG tablet Take 1 tablet (10 mg total) by mouth at bedtime as needed for sleep. 30 tablet 2  . busPIRone (BUSPAR) 10 MG tablet Take 1 tablet (10 mg total) by mouth 3 (three) times daily. (Patient not taking: Reported on 09/18/2019) 90 tablet 0  . diclofenac (VOLTAREN) 75 MG EC tablet Take 75 mg by mouth 2 (two) times daily. (Patient not taking: Reported on 09/18/2019)    . diclofenac (VOLTAREN) 75 MG EC tablet Take 1 tablet (75 mg total) by mouth 2 (two) times daily. (Patient not taking: Reported on 09/18/2019) 50 tablet 2  . Doxepin HCl 6 MG TABS Take 1 tablet (6 mg total) by mouth at bedtime. (Patient not taking: Reported on 09/18/2019) 30 tablet 0  . enalapril (VASOTEC) 10 MG tablet Take 1 tablet (10 mg total) by mouth daily. TAKE (1) TABLET BY MOUTH ONCE DAILY. (Patient not taking: Reported on 09/18/2019) 90 tablet 3  . esomeprazole (NEXIUM) 40 MG capsule Take 1 capsule (40 mg total) by mouth daily. (Patient not taking: Reported on 09/18/2019) 30 capsule 3  . indomethacin (INDOCIN) 50 MG capsule Take 1 capsule (50 mg total) by mouth 2 (two) times daily with a meal. (Patient not taking: Reported on 09/18/2019) 60 capsule 2  . ondansetron (ZOFRAN) 4 MG tablet Take 4 mg by mouth every 8 (eight) hours as needed. (Patient not taking: Reported on 09/18/2019)    . promethazine (PHENERGAN) 25 MG tablet Take 0.5 tablets (12.5 mg total) by mouth every 6 (six) hours as needed for nausea. 60 tablet 3  . sucralfate (CARAFATE) 1 g tablet Take 1 tablet (1 g total) by mouth 4 (four) times daily -  with meals and at bedtime. (Patient not taking: Reported on 09/18/2019) 40 tablet 0  . tadalafil (CIALIS) 20 MG tablet Take 0.5-1 tablets (10-20 mg total) by mouth every other day as needed for erectile dysfunction. (Patient not taking: Reported on 09/18/2019) 10 tablet 11  . traZODone (DESYREL) 50 MG tablet Take  0.5-1 tablets (25-50 mg total) by mouth at bedtime as needed for sleep. (Patient not taking: Reported on 09/18/2019) 90 tablet 0   No facility-administered medications prior to visit.    Allergies  Allergen Reactions  . Codeine   . Escitalopram Other (See Comments)  Patient states it caused night terrors  . Hydrocodone Nausea And Vomiting  . Oxycodone Nausea And Vomiting  . Penicillins Nausea Only    .Has patient had a PCN reaction causing immediate rash, facial/tongue/throat swelling, SOB or lightheadedness with hypotension: No Has patient had a PCN reaction causing severe rash involving mucus membranes or skin necrosis: No  Has patient had a PCN reaction that required hospitalization: No Has patient had a PCN reaction occurring within the last 10 years: No If all of the above answers are "NO", then may proceed with Cephalosporin use.   Marland Kitchen Penicillins   . Trazodone Other (See Comments)    Increased BP and HR per patient  . Levofloxacin Rash    ROS Review of Systems Pertinent negatives and positives per hpi     Objective:    Physical Exam Vitals and nursing note reviewed.  Constitutional:      General: He is not in acute distress.    Appearance: Normal appearance. He is normal weight. He is not ill-appearing, toxic-appearing or diaphoretic.  Cardiovascular:     Rate and Rhythm: Normal rate and regular rhythm.  Pulmonary:     Effort: Pulmonary effort is normal. No respiratory distress.  Skin:    General: Skin is warm and dry.     Capillary Refill: Capillary refill takes less than 2 seconds.  Neurological:     General: No focal deficit present.     Mental Status: He is alert and oriented to person, place, and time. Mental status is at baseline.  Psychiatric:        Mood and Affect: Mood normal.        Behavior: Behavior normal.        Thought Content: Thought content normal.        Judgment: Judgment normal.     BP 135/88   Pulse 87   Temp 97.6 F (36.4 C)  (Temporal)   Resp 18   Ht 5\' 9"  (1.753 m)   Wt 150 lb 12.8 oz (68.4 kg)   SpO2 97%   BMI 22.27 kg/m  Wt Readings from Last 3 Encounters:  09/18/19 150 lb 12.8 oz (68.4 kg)  02/23/19 157 lb 3.2 oz (71.3 kg)  12/29/18 157 lb (71.2 kg)     There are no preventive care reminders to display for this patient.  There are no preventive care reminders to display for this patient.  Lab Results  Component Value Date   TSH 1.350 08/24/2016   Lab Results  Component Value Date   WBC 9.5 11/29/2018   HGB 14.9 11/29/2018   HCT 45.1 11/29/2018   MCV 90 11/29/2018   PLT 315 11/29/2018   Lab Results  Component Value Date   NA 139 11/29/2018   K 4.6 11/29/2018   CO2 26 11/29/2018   GLUCOSE 262 (H) 11/29/2018   BUN 15 11/29/2018   CREATININE 1.30 (H) 11/29/2018   BILITOT 0.4 11/29/2018   ALKPHOS 100 11/29/2018   AST 17 11/29/2018   ALT 21 11/29/2018   PROT 7.3 11/29/2018   ALBUMIN 4.2 11/29/2018   CALCIUM 9.2 11/29/2018   ANIONGAP 9 08/27/2016   Lab Results  Component Value Date   CHOL 159 11/29/2018   Lab Results  Component Value Date   HDL 44 11/29/2018   Lab Results  Component Value Date   LDLCALC 87 11/29/2018   Lab Results  Component Value Date   TRIG 161 (H) 11/29/2018   Lab Results  Component Value Date  CHOLHDL 3.6 11/29/2018   Lab Results  Component Value Date   HGBA1C 6.3 (H) 08/24/2016      Assessment & Plan:   Problem List Items Addressed This Visit      Cardiovascular and Mediastinum   Essential hypertension - Primary   Relevant Medications   enalapril (VASOTEC) 20 MG tablet   Other Relevant Orders   CBC   Comprehensive metabolic panel     Other   Nausea without vomiting   Relevant Medications   promethazine (PHENERGAN) 25 MG tablet    Other Visit Diagnoses    Chronic pain syndrome       Relevant Medications   diclofenac (VOLTAREN) 75 MG EC tablet      Meds ordered this encounter  Medications  . diclofenac (VOLTAREN) 75 MG EC  tablet    Sig: Take 1 tablet (75 mg total) by mouth 2 (two) times daily.    Dispense:  90 tablet    Refill:  3    Order Specific Question:   Supervising Provider    Answer:   Carlota Raspberry, JEFFREY R [[id number]]  . promethazine (PHENERGAN) 25 MG tablet    Sig: Take 0.5 tablets (12.5 mg total) by mouth every 6 (six) hours as needed.    Dispense:  90 tablet    Refill:  3    Order Specific Question:   Supervising Provider    Answer:   Carlota Raspberry, JEFFREY R [[id number]]  . enalapril (VASOTEC) 20 MG tablet    Sig: Take 1 tablet (20 mg total) by mouth daily.    Dispense:  90 tablet    Refill:  3    Order Specific Question:   Supervising Provider    Answer:   Carlota Raspberry, JEFFREY R [[id number]]    Follow-up: No follow-ups on file.   PLAN  Refill promethazine. Increase enalapril to 20mg  PO qd  Will try diclofenac in place of indomethacin. Discussed r/b/se of this change  Will draw labs and follow up as warranted  Patient encouraged to call clinic with any questions, comments, or concerns.  Maximiano Coss, NP

## 2019-09-19 LAB — CBC
Hematocrit: 52.2 % — ABNORMAL HIGH (ref 37.5–51.0)
Hemoglobin: 16.6 g/dL (ref 13.0–17.7)
MCH: 29.2 pg (ref 26.6–33.0)
MCHC: 31.8 g/dL (ref 31.5–35.7)
MCV: 92 fL (ref 79–97)
Platelets: 335 10*3/uL (ref 150–450)
RBC: 5.69 x10E6/uL (ref 4.14–5.80)
RDW: 13.2 % (ref 11.6–15.4)
WBC: 8.2 10*3/uL (ref 3.4–10.8)

## 2019-09-19 LAB — COMPREHENSIVE METABOLIC PANEL
ALT: 35 IU/L (ref 0–44)
AST: 24 IU/L (ref 0–40)
Albumin/Globulin Ratio: 1.7 (ref 1.2–2.2)
Albumin: 4.9 g/dL (ref 3.8–4.9)
Alkaline Phosphatase: 135 IU/L — ABNORMAL HIGH (ref 48–121)
BUN/Creatinine Ratio: 10 (ref 9–20)
BUN: 12 mg/dL (ref 6–24)
Bilirubin Total: 1.2 mg/dL (ref 0.0–1.2)
CO2: 25 mmol/L (ref 20–29)
Calcium: 10 mg/dL (ref 8.7–10.2)
Chloride: 91 mmol/L — ABNORMAL LOW (ref 96–106)
Creatinine, Ser: 1.19 mg/dL (ref 0.76–1.27)
GFR calc Af Amer: 78 mL/min/{1.73_m2} (ref >59)
GFR calc non Af Amer: 67 mL/min/{1.73_m2} (ref >59)
Globulin, Total: 2.9 g/dL (ref 1.5–4.5)
Glucose: 368 mg/dL — ABNORMAL HIGH (ref 65–99)
Potassium: 4.7 mmol/L (ref 3.5–5.2)
Sodium: 131 mmol/L — ABNORMAL LOW (ref 134–144)
Total Protein: 7.8 g/dL (ref 6.0–8.5)

## 2019-09-19 NOTE — Progress Notes (Signed)
If we could call Mr. Odonohue -   His sugars are elevated. I'd like to see if we could get him in for a lab only a1c check - POCT would be fine - to make sure he isn't diabetic.  Thanks,  Kathrin Ruddy, NP

## 2019-10-16 ENCOUNTER — Encounter: Payer: Self-pay | Admitting: Registered Nurse

## 2019-10-16 ENCOUNTER — Ambulatory Visit (INDEPENDENT_AMBULATORY_CARE_PROVIDER_SITE_OTHER): Payer: Medicare Other | Admitting: Registered Nurse

## 2019-10-16 ENCOUNTER — Other Ambulatory Visit: Payer: Self-pay

## 2019-10-16 VITALS — BP 125/86 | HR 91 | Temp 98.0°F | Resp 18 | Ht 69.0 in | Wt 157.4 lb

## 2019-10-16 DIAGNOSIS — M549 Dorsalgia, unspecified: Secondary | ICD-10-CM

## 2019-10-16 DIAGNOSIS — I10 Essential (primary) hypertension: Secondary | ICD-10-CM

## 2019-10-16 DIAGNOSIS — M6281 Muscle weakness (generalized): Secondary | ICD-10-CM

## 2019-10-16 DIAGNOSIS — M542 Cervicalgia: Secondary | ICD-10-CM | POA: Diagnosis not present

## 2019-10-16 DIAGNOSIS — G8929 Other chronic pain: Secondary | ICD-10-CM

## 2019-10-16 NOTE — Progress Notes (Signed)
Established Patient Office Visit  Subjective:  Patient ID: Duane [name], male    DOB: 05/06/1960  Age: 59 y.o. MRN: [id number]  CC:  Chief Complaint  Patient presents with  . Medication Management    patient is here to check on his Blood pressure medication. Patient would like to discuss insurance paperwork.    HPI ABU HEAVIN presents for bp check  Much improved with increased dose. Feeling well. No CV symptoms. He is without complaint  Has insurance forms regarding his ongoing orthopedic concerns that leave him unable to work. I have discussed these with him and filled them out with him. Copy has been made and scanned into chart.   Past Medical History:  Diagnosis Date  . Arthritis   . Gout   . Hypertension     Past Surgical History:  Procedure Laterality Date  . ELBOW SURGERY    . FOOT FASCIOTOMY     X 5  . HEMORRHOID SURGERY     X2, Dr. Arnoldo Morale  . NECK EXPLORATION    . SHOULDER ARTHROTOMY     multiple     Family History  Problem Relation Age of Onset  . Diabetes Mother   . COPD Father   . Colon cancer Neg Hx     Social History   Socioeconomic History  . Marital status: Unknown    Spouse name: Not on file  . Number of children: 0  . Years of education: Not on file  . Highest education level: Not on file  Occupational History  . Occupation: Chemical engineer: [employer]  Tobacco Use  . Smoking status: Never Smoker  . Smokeless tobacco: Never Used  Vaping Use  . Vaping Use: Never used  Substance and Sexual Activity  . Alcohol use: Never  . Drug use: Never  . Sexual activity: Not Currently  Other Topics Concern  . Not on file  Social History Narrative   ** Merged History Encounter **       Social Determinants of Health   Financial Resource Strain:   . Difficulty of Paying Living Expenses: Not on file  Food Insecurity:   . Worried About Charity fundraiser in the Last Year: Not on file  . Ran Out of Food in the Last  Year: Not on file  Transportation Needs:   . Lack of Transportation (Medical): Not on file  . Lack of Transportation (Non-Medical): Not on file  Physical Activity:   . Days of Exercise per Week: Not on file  . Minutes of Exercise per Session: Not on file  Stress:   . Feeling of Stress : Not on file  Social Connections:   . Frequency of Communication with Friends and Family: Not on file  . Frequency of Social Gatherings with Friends and Family: Not on file  . Attends Religious Services: Not on file  . Active Member of Clubs or Organizations: Not on file  . Attends Archivist Meetings: Not on file  . Marital Status: Not on file  Intimate Partner Violence:   . Fear of Current or Ex-Partner: Not on file  . Emotionally Abused: Not on file  . Physically Abused: Not on file  . Sexually Abused: Not on file    Outpatient Medications Prior to Visit  Medication Sig Dispense Refill  . acetaminophen (TYLENOL) 650 MG CR tablet Take 1,300 mg by mouth every 8 (eight) hours as needed for pain.    Marland Kitchen  diclofenac (VOLTAREN) 75 MG EC tablet Take 1 tablet (75 mg total) by mouth 2 (two) times daily. 90 tablet 3  . enalapril (VASOTEC) 20 MG tablet Take 1 tablet (20 mg total) by mouth daily. 90 tablet 3  . HYDROmorphone (DILAUDID) 4 MG tablet Take 4 mg by mouth every 4 (four) hours as needed.    . indomethacin (INDOCIN) 50 MG capsule Take 1 capsule (50 mg total) by mouth 3 (three) times daily as needed. 60 capsule 3  . promethazine (PHENERGAN) 25 MG tablet Take 0.5 tablets (12.5 mg total) by mouth every 6 (six) hours as needed. 90 tablet 3  . oxyCODONE (ROXICODONE) 15 MG immediate release tablet Take 15 mg by mouth every 4 (four) hours as needed for pain. (Patient not taking: Reported on 09/18/2019)    . QUEtiapine (SEROQUEL) 50 MG tablet Take 1 tablet (50 mg total) by mouth at bedtime. (Patient not taking: Reported on 09/18/2019) 90 tablet 1  . zolpidem (AMBIEN) 10 MG tablet Take 1 tablet (10 mg total)  by mouth at bedtime as needed for sleep. 30 tablet 2   No facility-administered medications prior to visit.    Allergies  Allergen Reactions  . Codeine   . Escitalopram Other (See Comments)    Patient states it caused night terrors  . Hydrocodone Nausea And Vomiting  . Oxycodone Nausea And Vomiting  . Penicillins Nausea Only    .Has patient had a PCN reaction causing immediate rash, facial/tongue/throat swelling, SOB or lightheadedness with hypotension: No Has patient had a PCN reaction causing severe rash involving mucus membranes or skin necrosis: No  Has patient had a PCN reaction that required hospitalization: No Has patient had a PCN reaction occurring within the last 10 years: No If all of the above answers are "NO", then may proceed with Cephalosporin use.   Marland Kitchen Penicillins   . Trazodone Other (See Comments)    Increased BP and HR per patient  . Levofloxacin Rash    ROS Review of Systems  Constitutional: Negative.   HENT: Negative.   Eyes: Negative.   Respiratory: Negative.   Cardiovascular: Negative.   Gastrointestinal: Negative.   Genitourinary: Negative.   Musculoskeletal: Positive for arthralgias, back pain, gait problem and myalgias.  Skin: Negative.   Psychiatric/Behavioral: Negative.       Objective:    Physical Exam Constitutional:      General: He is not in acute distress.    Appearance: Normal appearance. He is normal weight. He is not ill-appearing, toxic-appearing or diaphoretic.  Cardiovascular:     Rate and Rhythm: Normal rate and regular rhythm.     Heart sounds: Normal heart sounds. No murmur heard.  No friction rub. No gallop.   Pulmonary:     Effort: Pulmonary effort is normal. No respiratory distress.     Breath sounds: Normal breath sounds. No stridor. No wheezing, rhonchi or rales.  Chest:     Chest wall: No tenderness.  Neurological:     General: No focal deficit present.     Mental Status: He is alert and oriented to person, place,  and time. Mental status is at baseline.  Psychiatric:        Mood and Affect: Mood normal.        Behavior: Behavior normal.        Thought Content: Thought content normal.        Judgment: Judgment normal.     BP 125/86   Pulse 91   Temp 98 F (  36.7 C) (Temporal)   Resp 18   Ht 5\' 9"  (1.753 m)   Wt 157 lb 6.4 oz (71.4 kg)   SpO2 98%   BMI 23.24 kg/m  Wt Readings from Last 3 Encounters:  10/16/19 157 lb 6.4 oz (71.4 kg)  09/18/19 150 lb 12.8 oz (68.4 kg)  02/23/19 157 lb 3.2 oz (71.3 kg)     There are no preventive care reminders to display for this patient.  There are no preventive care reminders to display for this patient.  Lab Results  Component Value Date   TSH 1.350 08/24/2016   Lab Results  Component Value Date   WBC 8.2 09/18/2019   HGB 16.6 09/18/2019   HCT 52.2 (H) 09/18/2019   MCV 92 09/18/2019   PLT 335 09/18/2019   Lab Results  Component Value Date   NA 131 (L) 09/18/2019   K 4.7 09/18/2019   CO2 25 09/18/2019   GLUCOSE 368 (H) 09/18/2019   BUN 12 09/18/2019   CREATININE 1.19 09/18/2019   BILITOT 1.2 09/18/2019   ALKPHOS 135 (H) 09/18/2019   AST 24 09/18/2019   ALT 35 09/18/2019   PROT 7.8 09/18/2019   ALBUMIN 4.9 09/18/2019   CALCIUM 10.0 09/18/2019   ANIONGAP 9 08/27/2016   Lab Results  Component Value Date   CHOL 159 11/29/2018   Lab Results  Component Value Date   HDL 44 11/29/2018   Lab Results  Component Value Date   LDLCALC 87 11/29/2018   Lab Results  Component Value Date   TRIG 161 (H) 11/29/2018   Lab Results  Component Value Date   CHOLHDL 3.6 11/29/2018   Lab Results  Component Value Date   HGBA1C 6.3 (H) 08/24/2016      Assessment & Plan:   Problem List Items Addressed This Visit      Cardiovascular and Mediastinum   Essential hypertension     Other   Chronic neck and back pain - Primary   Muscle weakness (generalized)      No orders of the defined types were placed in this  encounter.   Follow-up: No follow-ups on file.   PLAN  Continue current medication regimen  Return in 6 mo  Forms filled out  Patient encouraged to call clinic with any questions, comments, or concerns.  Maximiano Coss, NP

## 2019-10-16 NOTE — Patient Instructions (Signed)
° ° ° °  If you have lab work done today you will be contacted with your lab results within the next 2 weeks.  If you have not heard from us then please contact us. The fastest way to get your results is to register for My Chart. ° ° °IF you received an x-ray today, you will receive an invoice from [facility]. Please contact [facility] at [phone] with questions or concerns regarding your invoice.  ° °IF you received labwork today, you will receive an invoice from [facility]. Please contact [facility] at [phone] with questions or concerns regarding your invoice.  ° °Our billing staff will not be able to assist you with questions regarding bills from these companies. ° °You will be contacted with the lab results as soon as they are available. The fastest way to get your results is to activate your My Chart account. Instructions are located on the last page of this paperwork. If you have not heard from us regarding the results in 2 weeks, please contact this office. °  ° ° ° °

## 2019-10-31 ENCOUNTER — Emergency Department (HOSPITAL_COMMUNITY): Payer: Medicare Other

## 2019-10-31 ENCOUNTER — Other Ambulatory Visit: Payer: Self-pay

## 2019-10-31 ENCOUNTER — Encounter (HOSPITAL_COMMUNITY): Payer: Self-pay

## 2019-10-31 ENCOUNTER — Observation Stay (HOSPITAL_COMMUNITY)
Admission: EM | Admit: 2019-10-31 | Discharge: 2019-11-02 | Disposition: A | Discharge status: Home / Self Care | Payer: Medicare Other | Attending: Internal Medicine | Admitting: Internal Medicine

## 2019-10-31 DIAGNOSIS — Z79899 Other long term (current) drug therapy: Secondary | ICD-10-CM | POA: Insufficient documentation

## 2019-10-31 DIAGNOSIS — R739 Hyperglycemia, unspecified: Secondary | ICD-10-CM | POA: Diagnosis present

## 2019-10-31 DIAGNOSIS — I10 Essential (primary) hypertension: Secondary | ICD-10-CM

## 2019-10-31 DIAGNOSIS — Z20822 Contact with and (suspected) exposure to covid-19: Secondary | ICD-10-CM | POA: Diagnosis not present

## 2019-10-31 DIAGNOSIS — R17 Unspecified jaundice: Secondary | ICD-10-CM

## 2019-10-31 DIAGNOSIS — K760 Fatty (change of) liver, not elsewhere classified: Secondary | ICD-10-CM

## 2019-10-31 DIAGNOSIS — K5900 Constipation, unspecified: Secondary | ICD-10-CM

## 2019-10-31 DIAGNOSIS — I129 Hypertensive chronic kidney disease with stage 1 through stage 4 chronic kidney disease, or unspecified chronic kidney disease: Secondary | ICD-10-CM | POA: Diagnosis not present

## 2019-10-31 DIAGNOSIS — R109 Unspecified abdominal pain: Secondary | ICD-10-CM | POA: Diagnosis present

## 2019-10-31 DIAGNOSIS — N189 Chronic kidney disease, unspecified: Secondary | ICD-10-CM | POA: Diagnosis not present

## 2019-10-31 DIAGNOSIS — K810 Acute cholecystitis: Secondary | ICD-10-CM

## 2019-10-31 DIAGNOSIS — R6510 Systemic inflammatory response syndrome (SIRS) of non-infectious origin without acute organ dysfunction: Secondary | ICD-10-CM | POA: Diagnosis not present

## 2019-10-31 DIAGNOSIS — R1030 Lower abdominal pain, unspecified: Principal | ICD-10-CM | POA: Insufficient documentation

## 2019-10-31 DIAGNOSIS — D72829 Elevated white blood cell count, unspecified: Secondary | ICD-10-CM

## 2019-10-31 DIAGNOSIS — K5903 Drug induced constipation: Secondary | ICD-10-CM | POA: Diagnosis not present

## 2019-10-31 DIAGNOSIS — K648 Other hemorrhoids: Secondary | ICD-10-CM

## 2019-10-31 DIAGNOSIS — N179 Acute kidney failure, unspecified: Secondary | ICD-10-CM | POA: Diagnosis not present

## 2019-10-31 DIAGNOSIS — M542 Cervicalgia: Secondary | ICD-10-CM

## 2019-10-31 DIAGNOSIS — E860 Dehydration: Secondary | ICD-10-CM

## 2019-10-31 DIAGNOSIS — G8929 Other chronic pain: Secondary | ICD-10-CM | POA: Diagnosis present

## 2019-10-31 DIAGNOSIS — M549 Dorsalgia, unspecified: Secondary | ICD-10-CM

## 2019-10-31 LAB — TYPE AND SCREEN
ABO/RH(D): AB POS
Antibody Screen: NEGATIVE

## 2019-10-31 LAB — COMPREHENSIVE METABOLIC PANEL
ALT: 28 U/L (ref 0–44)
AST: 36 U/L (ref 15–41)
Albumin: 4.4 g/dL (ref 3.5–5.0)
Alkaline Phosphatase: 103 U/L (ref 38–126)
Anion gap: 20 — ABNORMAL HIGH (ref 5–15)
BUN: 20 mg/dL (ref 6–20)
CO2: 16 mmol/L — ABNORMAL LOW (ref 22–32)
Calcium: 9.5 mg/dL (ref 8.9–10.3)
Chloride: 101 mmol/L (ref 98–111)
Creatinine, Ser: 1.58 mg/dL — ABNORMAL HIGH (ref 0.61–1.24)
GFR, Estimated: 48 mL/min — ABNORMAL LOW (ref >60)
Glucose, Bld: 296 mg/dL — ABNORMAL HIGH (ref 70–99)
Potassium: 4.1 mmol/L (ref 3.5–5.1)
Sodium: 137 mmol/L (ref 135–145)
Total Bilirubin: 1.7 mg/dL — ABNORMAL HIGH (ref 0.3–1.2)
Total Protein: 8 g/dL (ref 6.5–8.1)

## 2019-10-31 LAB — DIFFERENTIAL
Abs Immature Granulocytes: 0.21 10*3/uL — ABNORMAL HIGH (ref 0.00–0.07)
Basophils Absolute: 0 10*3/uL (ref 0.0–0.1)
Basophils Relative: 0 %
Eosinophils Absolute: 0 10*3/uL (ref 0.0–0.5)
Eosinophils Relative: 0 %
Immature Granulocytes: 1 %
Lymphocytes Relative: 7 %
Lymphs Abs: 1.6 10*3/uL (ref 0.7–4.0)
Monocytes Absolute: 1.1 10*3/uL — ABNORMAL HIGH (ref 0.1–1.0)
Monocytes Relative: 5 %
Neutro Abs: 21.3 10*3/uL — ABNORMAL HIGH (ref 1.7–7.7)
Neutrophils Relative %: 87 %

## 2019-10-31 LAB — CBC
HCT: 50.9 % (ref 39.0–52.0)
Hemoglobin: 17.6 g/dL — ABNORMAL HIGH (ref 13.0–17.0)
MCH: 30.8 pg (ref 26.0–34.0)
MCHC: 34.6 g/dL (ref 30.0–36.0)
MCV: 89 fL (ref 80.0–100.0)
Platelets: 348 10*3/uL (ref 150–400)
RBC: 5.72 MIL/uL (ref 4.22–5.81)
RDW: 12.2 % (ref 11.5–15.5)
WBC: 24.1 10*3/uL — ABNORMAL HIGH (ref 4.0–10.5)
nRBC: 0 % (ref 0.0–0.2)

## 2019-10-31 LAB — RESPIRATORY PANEL BY RT PCR (FLU A&B, COVID)
Influenza A by PCR: NEGATIVE
Influenza B by PCR: NEGATIVE
SARS Coronavirus 2 by RT PCR: NEGATIVE

## 2019-10-31 LAB — LIPASE, BLOOD: Lipase: 43 U/L (ref 11–51)

## 2019-10-31 MED ORDER — ONDANSETRON HCL 4 MG/2ML IJ SOLN
4.0000 mg | Freq: Once | INTRAMUSCULAR | Status: AC
  Administered 2019-10-31: 4 mg via INTRAVENOUS
  Filled 2019-10-31: qty 2

## 2019-10-31 MED ORDER — HYDROMORPHONE HCL 1 MG/ML IJ SOLN
1.0000 mg | Freq: Once | INTRAMUSCULAR | Status: AC
  Administered 2019-10-31: 1 mg via INTRAVENOUS
  Filled 2019-10-31: qty 1

## 2019-10-31 MED ORDER — ONDANSETRON HCL 4 MG/2ML IJ SOLN
4.0000 mg | Freq: Three times a day (TID) | INTRAMUSCULAR | Status: DC | PRN
  Administered 2019-10-31 – 2019-11-01 (×2): 4 mg via INTRAVENOUS
  Filled 2019-10-31 (×2): qty 2

## 2019-10-31 MED ORDER — SODIUM CHLORIDE 0.9 % IV BOLUS
1000.0000 mL | Freq: Once | INTRAVENOUS | Status: AC
  Administered 2019-10-31: 1000 mL via INTRAVENOUS

## 2019-10-31 MED ORDER — POLYETHYLENE GLYCOL 3350 17 G PO PACK
17.0000 g | PACK | Freq: Every day | ORAL | Status: DC
  Administered 2019-11-01: 17 g via ORAL
  Filled 2019-10-31 (×2): qty 1

## 2019-10-31 MED ORDER — IOHEXOL 300 MG/ML  SOLN
75.0000 mL | Freq: Once | INTRAMUSCULAR | Status: AC | PRN
  Administered 2019-10-31: 75 mL via INTRAVENOUS

## 2019-10-31 MED ORDER — ZOLPIDEM TARTRATE 5 MG PO TABS
10.0000 mg | ORAL_TABLET | Freq: Once | ORAL | Status: AC
  Administered 2019-10-31: 10 mg via ORAL
  Filled 2019-10-31: qty 2

## 2019-10-31 MED ORDER — HYOSCYAMINE SULFATE ER 0.375 MG PO TB12
0.3750 mg | ORAL_TABLET | Freq: Two times a day (BID) | ORAL | Status: AC
  Administered 2019-10-31 – 2019-11-01 (×2): 0.375 mg via ORAL
  Filled 2019-10-31 (×2): qty 1

## 2019-10-31 MED ORDER — SODIUM CHLORIDE 0.9 % IV SOLN: Freq: Once | INTRAVENOUS | Status: AC

## 2019-10-31 NOTE — ED Notes (Signed)
Removed a moderate amount of round stool balls from rectum. Blood noted on stool edp notified

## 2019-10-31 NOTE — ED Notes (Signed)
Pt had a large stool before enema, so emema on hold.

## 2019-10-31 NOTE — ED Provider Notes (Signed)
[facility] EMERGENCY DEPARTMENT Provider Note   CSN: [id number] Arrival date & time: 10/31/19  1801     History Chief Complaint  Patient presents with  . Abdominal Pain    no BM in 4 days    Duane [name] is a 59 y.o. male.  Patient complains of severe abdominal pain that started today.  No vomiting no diarrhea no blood in the vomit or stool  The history is provided by the patient. No language interpreter was used.  Abdominal Pain Pain location:  RLQ and LLQ Pain quality: aching   Pain radiates to:  Does not radiate Pain severity:  Moderate Onset quality:  Sudden Timing:  Constant Progression:  Worsening Chronicity:  New Context: not alcohol use   Relieved by:  Nothing Associated symptoms: no chest pain, no cough, no diarrhea, no fatigue and no hematuria        Past Medical History:  Diagnosis Date  . Arthritis   . Gout   . Hypertension     Patient Active Problem List   Diagnosis Date Noted  . Abdominal pain 10/31/2019  . Hospital discharge follow-up 09/01/2016  . Cough 09/01/2016  . Pneumonia due to Mycoplasma pneumoniae 09/01/2016  . Lobar pneumonia ([place]) 08/27/2016  . Fever, unknown origin 08/26/2016  . Sepsis due to undetermined organism ([place]) 08/25/2016  . Gram negative sepsis ([place]) 08/25/2016  . Bacteremia due to Gram-negative bacteria 08/25/2016  . Syncope 08/25/2016  . Syncope and collapse 08/24/2016  . Sepsis due to pneumonia ([place]) 08/24/2016  . Hyperglycemia 08/24/2016  . Nausea without vomiting 07/02/2016  . Erectile dysfunction 07/02/2016  . SLAP lesion of shoulder 08/04/2012  . Muscle weakness (generalized) 08/04/2012  . Chronic neck and back pain 08/31/2011  . Deformity, acquired 08/31/2011  . Essential hypertension 08/31/2011  . Insomnia 08/31/2011  . Epigastric discomfort 05/03/2011  . LUMBAR SPRAIN AND STRAIN 10/22/2009  . CUBITAL TUNNEL SYNDROME 10/24/2007  . SHOULDER PAIN 10/24/2007  . 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  ELBOW PAIN 05/25/2007    Past Surgical History:  Procedure Laterality Date  . ELBOW SURGERY    . FOOT FASCIOTOMY     X 5  . HEMORRHOID SURGERY     X2, Dr. Arnoldo Morale  . NECK EXPLORATION    . SHOULDER ARTHROTOMY     multiple        Family History  Problem Relation Age of Onset  . Diabetes Mother   . COPD Father   . Colon cancer Neg Hx     Social History   Tobacco Use  . Smoking status: Never Smoker  . Smokeless tobacco: Never Used  Vaping Use  . Vaping Use: Never used  Substance Use Topics  . Alcohol use: Never  . Drug use: Never    Home Medications Prior to Admission medications   Medication Sig Start Date End Date Taking? Authorizing Provider  acetaminophen (TYLENOL) 650 MG CR tablet Take 1,300 mg by mouth every 8 (eight) hours as needed for pain.    [provider]  diclofenac (VOLTAREN) 75 MG EC tablet Take 1 tablet (75 mg total) by mouth 2 (two) times daily. 09/18/19   Maximiano Coss, NP  enalapril (VASOTEC) 20 MG tablet Take 1 tablet (20 mg total) by mouth daily. 09/18/19   Maximiano Coss, NP  HYDROmorphone (DILAUDID) 4 MG tablet Take 4 mg by mouth every 4 (four) hours as needed. 12/26/18   [provider]  indomethacin (INDOCIN) 50 MG capsule Take 1 capsule (  50 mg total) by mouth 3 (three) times daily as needed. 09/04/18   Maximiano Coss, NP  oxyCODONE (ROXICODONE) 15 MG immediate release tablet Take 15 mg by mouth every 4 (four) hours as needed for pain. Patient not taking: Reported on 09/18/2019    [provider]  promethazine (PHENERGAN) 25 MG tablet Take 0.5 tablets (12.5 mg total) by mouth every 6 (six) hours as needed. 09/18/19   Maximiano Coss, NP  QUEtiapine (SEROQUEL) 50 MG tablet Take 1 tablet (50 mg total) by mouth at bedtime. Patient not taking: Reported on 09/18/2019 02/23/19   Maximiano Coss, NP  zolpidem (AMBIEN) 10 MG tablet Take 1 tablet (10 mg total) by mouth at bedtime as needed for sleep. 11/17/18 02/15/19  Maximiano Coss, NP      Allergies    Codeine, Escitalopram, Hydrocodone, Oxycodone, Penicillins, Penicillins, Trazodone, and Levofloxacin  Review of Systems   Review of Systems  Constitutional: Negative for appetite change and fatigue.  HENT: Negative for congestion, ear discharge and sinus pressure.   Eyes: Negative for discharge.  Respiratory: Negative for cough.   Cardiovascular: Negative for chest pain.  Gastrointestinal: Positive for abdominal pain. Negative for diarrhea.  Genitourinary: Negative for frequency and hematuria.  Musculoskeletal: Negative for back pain.  Skin: Negative for rash.  Neurological: Negative for seizures and headaches.  Psychiatric/Behavioral: Negative for hallucinations.    Physical Exam Updated Vital Signs BP 114/71   Pulse (!) 116   Temp 97.9 F (36.6 C) (Oral)   Resp (!) 21   Ht 5\' 9"  (1.753 m)   Wt 70.3 kg   SpO2 94%   BMI 22.89 kg/m   Physical Exam Vitals reviewed.  Constitutional:      Appearance: He is well-developed.  HENT:     Head: Normocephalic.     Nose: Nose normal.  Eyes:     General: No scleral icterus.    Conjunctiva/sclera: Conjunctivae normal.  Neck:     Thyroid: No thyromegaly.  Cardiovascular:     Rate and Rhythm: Normal rate and regular rhythm.     Heart sounds: No murmur heard.  No friction rub. No gallop.   Pulmonary:     Breath sounds: No stridor. No wheezing or rales.  Chest:     Chest wall: No tenderness.  Abdominal:     General: There is no distension.     Tenderness: There is abdominal tenderness. There is no rebound.     Comments: Severe left lower quadrant right lower quadrant and periumbilical abdominal pain with rebound tenderness  Musculoskeletal:        General: Normal range of motion.     Cervical back: Neck supple.  Lymphadenopathy:     Cervical: No cervical adenopathy.  Skin:    Findings: No erythema or rash.  Neurological:     Mental Status: He is alert and oriented to person, place, and time.     Motor:  No abnormal muscle tone.     Coordination: Coordination normal.  Psychiatric:        Behavior: Behavior normal.     ED Results / Procedures / Treatments   Labs (all labs ordered are listed, but only abnormal results are displayed) Labs Reviewed  COMPREHENSIVE METABOLIC PANEL - Abnormal; Notable for the following components:      Result Value   CO2 16 (*)    Glucose, Bld 296 (*)    Creatinine, Ser 1.58 (*)    Total Bilirubin 1.7 (*)    GFR, Estimated 48 (*)  Anion gap 20 (*)    All other components within normal limits  CBC - Abnormal; Notable for the following components:   WBC 24.1 (*)    Hemoglobin 17.6 (*)    All other components within normal limits  DIFFERENTIAL - Abnormal; Notable for the following components:   Neutro Abs 21.3 (*)    Monocytes Absolute 1.1 (*)    Abs Immature Granulocytes 0.21 (*)    All other components within normal limits  RESPIRATORY PANEL BY RT PCR (FLU A&B, COVID)  LIPASE, BLOOD  URINALYSIS, ROUTINE W REFLEX MICROSCOPIC  OCCULT BLOOD X 1 CARD TO LAB, STOOL  TYPE AND SCREEN    EKG None  Radiology DG Abdomen 1 View  Result Date: 10/31/2019 CLINICAL DATA:  Abdomen pain EXAM: ABDOMEN - 1 VIEW COMPARISON:  None. FINDINGS: Nonobstructed bowel-gas pattern. No radiopaque calculi are visualized. IMPRESSION: Negative. Electronically Signed   By: Donavan Foil M.D.   On: 10/31/2019 18:52   CT ABDOMEN PELVIS W CONTRAST  Result Date: 10/31/2019 CLINICAL DATA:  58 year old male with sudden onset severe abdominal pain 1430 hours. EXAM: CT ABDOMEN AND PELVIS WITH CONTRAST TECHNIQUE: Multidetector CT imaging of the abdomen and pelvis was performed using the standard protocol following bolus administration of intravenous contrast. CONTRAST:  36mL OMNIPAQUE IOHEXOL 300 MG/ML  SOLN COMPARISON:  KUB at 1842 hours today. CT Abdomen and Pelvis 08/26/2016 FINDINGS: Lower chest: Resolved right lower lobe infection seen in 2018. Minor residual atelectasis or  scarring at the right lung base. Otherwise negative lower chest. No pericardial or pleural effusion. Hepatobiliary: Mild interposition of redundant hepatic flexure now between portion of the right hepatic lobe and lateral diaphragm. Evidence of chronic hepatic steatosis. But otherwise negative liver and gallbladder. No bile duct enlargement. Pancreas: Diminutive, negative. Spleen: Negative. Adrenals/Urinary Tract: Normal adrenal glands. Bilateral renal enhancement is normal. There is a small benign appearing left renal midpole cyst (series 7, image 22). Only early contrast excretion to the renal pyramids on the delayed images, but no hydronephrosis, hydroureter, or perinephric stranding. Decompressed and negative ureters. No urinary calculus. Unremarkable urinary bladder. Stomach/Bowel: Redundant large bowel with retained stool throughout. Increased stool ball in the rectum compared to 2018 (series 2, image 91). Primarily liquid stool in the redundant right and transverse colons. No large bowel inflammation. Normal appendix on series 2, image 70. Negative terminal ileum. No dilated or abnormal small bowel. Stomach moderately distended with fluid but otherwise negative. Decompressed and negative duodenum. No free air, free fluid, mesenteric stranding. Vascular/Lymphatic: Major arterial structures are patent and appear normal. Portal venous system is patent. No lymphadenopathy. Reproductive: Negative. Other: No pelvic free fluid. Musculoskeletal: Negative. IMPRESSION: 1. Redundant large bowel with retained stool throughout. Increased stool ball in the rectum since 2018. Query constipation, fecal impaction. 2. Otherwise no acute or inflammatory process identified in the abdomen or pelvis. Normal appendix. 3. Chronic hepatic steatosis. Electronically Signed   By: Genevie Ann M.D.   On: 10/31/2019 19:56    Procedures Procedures (including critical care time)  Medications Ordered in ED Medications  sodium chloride 0.9  % bolus 1,000 mL (0 mLs Intravenous Stopped 10/31/19 2111)  HYDROmorphone (DILAUDID) injection 1 mg (1 mg Intravenous Given 10/31/19 1922)  ondansetron (ZOFRAN) injection 4 mg (4 mg Intravenous Given 10/31/19 1921)  iohexol (OMNIPAQUE) 300 MG/ML solution 75 mL (75 mLs Intravenous Contrast Given 10/31/19 1928)  sodium chloride 0.9 % bolus 1,000 mL (1,000 mLs Intravenous New Bag/Given 10/31/19 2027)   CRITICAL CARE Performed  by: Milton Ferguson Total critical care time:35 minutes Critical care time was exclusive of separately billable procedures and treating other patients. Critical care was necessary to treat or prevent imminent or life-threatening deterioration. Critical care was time spent personally by me on the following activities: development of treatment plan with patient and/or surrogate as well as nursing, discussions with consultants, evaluation of patient's response to treatment, examination of patient, obtaining history from patient or surrogate, ordering and performing treatments and interventions, ordering and review of laboratory studies, ordering and review of radiographic studies, pulse oximetry and re-evaluation of patient's condition.  ED Course  I have reviewed the triage vital signs and the nursing notes.  Pertinent labs & imaging results that were available during my care of the patient were reviewed by me and considered in my medical decision making (see chart for details). CT scan shows constipation with large stool ball.  Patient is tachycardic leukocytosis.  I spoke with general surgery Dr. Constance Haw and we will try to evacuate his bowels but he will be admitted to medicine for hydration and general surgery will see the patient tomorrow   MDM Rules/Calculators/A&P                          Abdominal pain dehydration elevated glucose.  Patient will be admitted to medicine.  Patient is severely constipated and has an elevated white blood count.  General surgery will follow the  patient Final Clinical Impression(s) / ED Diagnoses Final diagnoses:  Lower abdominal pain    Rx / DC Orders ED Discharge Orders    None       Milton Ferguson, MD 11/02/19 [id number]

## 2019-10-31 NOTE — H&P (Signed)
History and Physical  Duane [name] FWY:[id number] DOB: 13-Oct-1960 DOA: 10/31/2019  Referring physician: Milton Ferguson, MD PCP: Maximiano Coss, NP  Patient coming from: Home  Chief Complaint: Abdominal Pain  HPI: Duane [name] is a 59 y.o. male with medical history significant for hypertension, arthritis, chronic neck and back pain (follows with pain clinic at [facility]) and internal hemorrhoids who presents to the emergency department due to lower abdominal pain which started several hours prior to arrival to the ED.  Patient states that he has not had any bowel movement in 4 days and that this afternoon, he endorsed extreme straining while trying to have a bowel movement, this was associated with excessive sweating and palpitations, patient states that he thought he was having a heart attack, so he activated EMS.  He denies chest pain, shortness of breath, headache, blurry vision, nausea or vomiting.  ED Course:  In the emergency department, he was tachycardic and tachypneic, but other vital signs were within normal range.  Work-up in the ED showed leukocytosis, hemoglobin 17.6, hematocrit 50.9, creatinine 1.58 (creatinine was 1.75-month ago), total bilirubin 1.7.  Influenza A and B negative, SARS coronavirus 2 was negative.  CT abdomen and pelvis with contrast showed redundant large bowel with retained stool throughout.  Increased stool ball in the rectum since 2018.  Query constipation, fecal impaction.  Abdominal x-ray showed nonobstructive bowel gas pattern, no radiopaque calculi are visualized. Fecal disimpaction was done in the ED with a moderate amount of hard stool balls from rectum and with blood noted on stool.  Stool occult was heme positive and no anal fissure was noted per ED physician's note.  General surgery (Dr. Constance Haw) was consulted and will see patient in the morning per ED physician.  Review of Systems: Constitutional: Negative for chills and fever.  HENT: Negative for ear  pain and sore throat.   Eyes: Negative for pain and visual disturbance.  Respiratory: Negative for cough, chest tightness and shortness of breath.   Cardiovascular: Negative for chest pain and palpitations.  Gastrointestinal: Positive for abdominal pain.  Negative for vomiting.  Endocrine: Negative for polyphagia and polyuria.  Genitourinary: Negative for decreased urine volume, dysuria Musculoskeletal: Negative for arthralgias and back pain.  Skin: Negative for color change and rash.  Allergic/Immunologic: Negative for immunocompromised state.  Neurological: Negative for tremors, syncope, speech difficulty, weakness, light-headedness and headaches.  Hematological: Does not bruise/bleed easily.  All other systems reviewed and are negative .   Past Medical History:  Diagnosis Date  . Arthritis   . Gout   . Hypertension    Past Surgical History:  Procedure Laterality Date  . ELBOW SURGERY    . FOOT FASCIOTOMY     X 5  . HEMORRHOID SURGERY     X2, Dr. Arnoldo Morale  . NECK EXPLORATION    . SHOULDER ARTHROTOMY     multiple     Social History:  reports that he has never smoked. He has never used smokeless tobacco. He reports that he does not drink alcohol and does not use drugs.   Allergies  Allergen Reactions  . Codeine   . Escitalopram Other (See Comments)    Patient states it caused night terrors  . Hydrocodone Nausea And Vomiting  . Oxycodone Nausea And Vomiting  . Penicillins Nausea Only    .Has patient had a PCN reaction causing immediate rash, facial/tongue/throat swelling, SOB or lightheadedness with hypotension: No Has patient had a PCN reaction causing severe rash involving mucus membranes or  skin necrosis: No  Has patient had a PCN reaction that required hospitalization: No Has patient had a PCN reaction occurring within the last 10 years: No If all of the above answers are "NO", then may proceed with Cephalosporin use.   Marland Kitchen Penicillins   . Trazodone Other (See  Comments)    Increased BP and HR per patient  . Levofloxacin Rash    Family History  Problem Relation Age of Onset  . Diabetes Mother   . COPD Father   . Colon cancer Neg Hx    Prior to Admission medications   Medication Sig Start Date End Date Taking? Authorizing Provider  acetaminophen (TYLENOL) 650 MG CR tablet Take 1,300 mg by mouth every 8 (eight) hours as needed for pain.    [provider]  diclofenac (VOLTAREN) 75 MG EC tablet Take 1 tablet (75 mg total) by mouth 2 (two) times daily. 09/18/19   Maximiano Coss, NP  enalapril (VASOTEC) 20 MG tablet Take 1 tablet (20 mg total) by mouth daily. 09/18/19   Maximiano Coss, NP  HYDROmorphone (DILAUDID) 4 MG tablet Take 4 mg by mouth every 4 (four) hours as needed. 12/26/18   [provider]  indomethacin (INDOCIN) 50 MG capsule Take 1 capsule (50 mg total) by mouth 3 (three) times daily as needed. 09/04/18   Maximiano Coss, NP  oxyCODONE (ROXICODONE) 15 MG immediate release tablet Take 15 mg by mouth every 4 (four) hours as needed for pain. Patient not taking: Reported on 09/18/2019    [provider]  promethazine (PHENERGAN) 25 MG tablet Take 0.5 tablets (12.5 mg total) by mouth every 6 (six) hours as needed. 09/18/19   Maximiano Coss, NP  QUEtiapine (SEROQUEL) 50 MG tablet Take 1 tablet (50 mg total) by mouth at bedtime. Patient not taking: Reported on 09/18/2019 02/23/19   Maximiano Coss, NP  zolpidem (AMBIEN) 10 MG tablet Take 1 tablet (10 mg total) by mouth at bedtime as needed for sleep. 11/17/18 02/15/19  Maximiano Coss, NP    Physical Exam: BP (!) 144/126   Pulse (!) 114   Temp 98.5 F (36.9 C) (Oral)   Resp 18   Ht 5\' 9"  (1.753 m)   Wt 70.3 kg   SpO2 99%   BMI 22.89 kg/m   . General: 58 y.o. year-old male well developed well nourished in no acute distress.  Alert and oriented x3. Marland Kitchen HEENT: Dry mucous membrane.  NCAT, EOMI . Neck: Supple, trachea medial . Cardiovascular: Regular rate and rhythm with  no rubs or gallops.  No thyromegaly or JVD noted.  No lower extremity edema. 2/4 pulses in all 4 extremities. Marland Kitchen Respiratory: Clear to auscultation with no wheezes or rales. Good inspiratory effort. . Abdomen: Soft, nontender nondistended with normal bowel sounds x4 quadrants. . Muskuloskeletal: No cyanosis, clubbing or edema noted bilaterally . Neuro: CN II-XII intact, strength, sensation, reflexes . Skin: No ulcerative lesions noted or rashes . Psychiatry: Judgement and insight appear normal. Mood is appropriate for condition and setting          Labs on Admission:  Basic Metabolic Panel: Recent Labs  Lab 10/31/19 1838  NA 137  K 4.1  CL 101  CO2 16*  GLUCOSE 296*  BUN 20  CREATININE 1.58*  CALCIUM 9.5   Liver Function Tests: Recent Labs  Lab 10/31/19 1838  AST 36  ALT 28  ALKPHOS 103  BILITOT 1.7*  PROT 8.0  ALBUMIN 4.4   Recent Labs  Lab 10/31/19 1838  LIPASE 43   No results for input(s): AMMONIA in the last 168 hours. CBC: Recent Labs  Lab 10/31/19 1838  WBC 24.1*  NEUTROABS 21.3*  HGB 17.6*  HCT 50.9  MCV 89.0  PLT 348   Cardiac Enzymes: No results for input(s): CKTOTAL, CKMB, CKMBINDEX, TROPONINI in the last 168 hours.  BNP (last 3 results) No results for input(s): BNP in the last 8760 hours.  ProBNP (last 3 results) No results for input(s): PROBNP in the last 8760 hours.  CBG: No results for input(s): GLUCAP in the last 168 hours.  Radiological Exams on Admission: DG Abdomen 1 View  Result Date: 10/31/2019 CLINICAL DATA:  Abdomen pain EXAM: ABDOMEN - 1 VIEW COMPARISON:  None. FINDINGS: Nonobstructed bowel-gas pattern. No radiopaque calculi are visualized. IMPRESSION: Negative. Electronically Signed   By: Donavan Foil M.D.   On: 10/31/2019 18:52   CT ABDOMEN PELVIS W CONTRAST  Result Date: 10/31/2019 CLINICAL DATA:  59 year old male with sudden onset severe abdominal pain 1430 hours. EXAM: CT ABDOMEN AND PELVIS WITH CONTRAST TECHNIQUE:  Multidetector CT imaging of the abdomen and pelvis was performed using the standard protocol following bolus administration of intravenous contrast. CONTRAST:  67mL OMNIPAQUE IOHEXOL 300 MG/ML  SOLN COMPARISON:  KUB at 1842 hours today. CT Abdomen and Pelvis 08/26/2016 FINDINGS: Lower chest: Resolved right lower lobe infection seen in 2018. Minor residual atelectasis or scarring at the right lung base. Otherwise negative lower chest. No pericardial or pleural effusion. Hepatobiliary: Mild interposition of redundant hepatic flexure now between portion of the right hepatic lobe and lateral diaphragm. Evidence of chronic hepatic steatosis. But otherwise negative liver and gallbladder. No bile duct enlargement. Pancreas: Diminutive, negative. Spleen: Negative. Adrenals/Urinary Tract: Normal adrenal glands. Bilateral renal enhancement is normal. There is a small benign appearing left renal midpole cyst (series 7, image 22). Only early contrast excretion to the renal pyramids on the delayed images, but no hydronephrosis, hydroureter, or perinephric stranding. Decompressed and negative ureters. No urinary calculus. Unremarkable urinary bladder. Stomach/Bowel: Redundant large bowel with retained stool throughout. Increased stool ball in the rectum compared to 2018 (series 2, image 91). Primarily liquid stool in the redundant right and transverse colons. No large bowel inflammation. Normal appendix on series 2, image 70. Negative terminal ileum. No dilated or abnormal small bowel. Stomach moderately distended with fluid but otherwise negative. Decompressed and negative duodenum. No free air, free fluid, mesenteric stranding. Vascular/Lymphatic: Major arterial structures are patent and appear normal. Portal venous system is patent. No lymphadenopathy. Reproductive: Negative. Other: No pelvic free fluid. Musculoskeletal: Negative. IMPRESSION: 1. Redundant large bowel with retained stool throughout. Increased stool ball in the  rectum since 2018. Query constipation, fecal impaction. 2. Otherwise no acute or inflammatory process identified in the abdomen or pelvis. Normal appendix. 3. Chronic hepatic steatosis. Electronically Signed   By: Genevie Ann M.D.   On: 10/31/2019 19:56    EKG: I independently viewed the EKG done and my findings are as followed: EKG was not done in the ED  Assessment/Plan Present on Admission: . Abdominal pain . Chronic neck and back pain . Essential hypertension . Hyperglycemia  Principal Problem:   Abdominal pain Active Problems:   Chronic neck and back pain   Essential hypertension   Hyperglycemia   Constipation   Leukocytosis   Dehydration   Hepatic steatosis   SIRS (systemic inflammatory response syndrome) (HCC)   Internal hemorrhoids   Acute kidney injury superimposed on CKD (HCC)   Serum total bilirubin  elevated  Abdominal pain secondary to constipation due to chronic use of opioids  Patient presents to the emergency department due to abdominal pain, fecal disimpaction was attempted with removal of moderate amount of brown stool balls from rectum, this was followed by a large stool by patient without need of enema Abdominal pain has since resolved Patient states that he usually take over-the-counter laxatives while on home Dilaudid, but he has not taken any laxative in the last couple of days despite the use of his Dilaudid. Continue MiraLAX daily  Chronic neck and back pain (chronically on opioids) Patient has had prior neck surgery and complained of history of DDD, he states that he follows with a pain clinic in [place]. Patient takes Dilaudid chronically at home, this will be temporarily held tonight. Continue MiraLAX as described above.  Dehydration/acute kidney injury IV hydration provided in the ED, continue IV hydration BUN to creatinine 16/1.58, this was 1.19 on 09/18/2019 Renally adjust medications, avoid nephrotoxic  agents/dehydration/hypotension  Hyperglycemia with no history of type 2 diabetes mellitus Blood glucose was noted to be 296, this is consistently noted to be elevated ( > 200) since last 11 months, last A1c in August 2018 was 6.3, A1c will be checked with plan to start patient on sliding scale insulin if elevated while inpatient   Leukocytosis possibly reactive WBC 24.1, no obvious source of any acute infection at this time Continue to monitor WBC and continue to monitor for any acute infectious process and treat accordingly  Internal hemorrhoids Brown stool with blood noted on stool per ED medical record Patient endorsed similar presentation due to history of internal hemorrhoids whenever he has a bowel movement while constipated. H/H at 17.6/50.9 (this was 16.6/52.2 on 09/18/2019), continue to monitor H/H and consider further GI work-up for continuous drop in H/H and/or recurrent blood in stool General surgery (Dr. Constance Haw) was consulted and will see patient in the morning per ED physician  SIRS with no convincing evidence of sepsis  Patient presents with leukocytosis, tachypnea and tachycardia.  Tachypnea has since resolved. No obvious sign of any acute infection at this time  Essential hypertension Enalapril temporarily held due to acute kidney injury  Chronic hepatic steatosis by CT abdomen pelvis with contrast Stable, AST, ALT and ALP within normal range  Elevated total bilirubin Total bilirubin 1.7 (this was 1.2 on 09/18/2019) Direct bilirubin will be checked   DVT prophylaxis: SCDs (no chemoprophylaxis at this time due to bloodstained stool)  Code Status: Full code  Family Communication: None at bedside  Disposition Plan:  Patient is from:                        home Anticipated DC to:                   home Anticipated DC date:               1 day Anticipated DC barriers:          Patient currently unstable to be discharged due to constipation, dehydration, acute kidney  injury requiring IV hydration and blood in stool with history of internal hemorrhoids pending surgical consult and recommendation.   Consults called: General surgery  Admission status: Observation    Bernadette Hoit MD [facility]  10/31/2019, 11:37 PM

## 2019-10-31 NOTE — ED Provider Notes (Addendum)
Rectal showed brown stool that was hem positive.  No fissure seen    Milton Ferguson, MD 10/31/19 2103    Milton Ferguson, MD 10/31/19 2138

## 2019-11-01 ENCOUNTER — Observation Stay (HOSPITAL_COMMUNITY): Payer: Medicare Other

## 2019-11-01 ENCOUNTER — Encounter (HOSPITAL_COMMUNITY): Payer: Self-pay | Admitting: Internal Medicine

## 2019-11-01 ENCOUNTER — Telehealth: Payer: Self-pay | Admitting: Gastroenterology

## 2019-11-01 DIAGNOSIS — K5900 Constipation, unspecified: Secondary | ICD-10-CM

## 2019-11-01 DIAGNOSIS — R1030 Lower abdominal pain, unspecified: Secondary | ICD-10-CM | POA: Diagnosis not present

## 2019-11-01 LAB — COMPREHENSIVE METABOLIC PANEL
ALT: 22 U/L (ref 0–44)
AST: 25 U/L (ref 15–41)
Albumin: 3.3 g/dL — ABNORMAL LOW (ref 3.5–5.0)
Alkaline Phosphatase: 58 U/L (ref 38–126)
Anion gap: 14 (ref 5–15)
BUN: 22 mg/dL — ABNORMAL HIGH (ref 6–20)
CO2: 18 mmol/L — ABNORMAL LOW (ref 22–32)
Calcium: 8.2 mg/dL — ABNORMAL LOW (ref 8.9–10.3)
Chloride: 105 mmol/L (ref 98–111)
Creatinine, Ser: 1.43 mg/dL — ABNORMAL HIGH (ref 0.61–1.24)
GFR, Estimated: 54 mL/min — ABNORMAL LOW (ref >60)
Glucose, Bld: 335 mg/dL — ABNORMAL HIGH (ref 70–99)
Potassium: 4.9 mmol/L (ref 3.5–5.1)
Sodium: 137 mmol/L (ref 135–145)
Total Bilirubin: 1.5 mg/dL — ABNORMAL HIGH (ref 0.3–1.2)
Total Protein: 6.3 g/dL — ABNORMAL LOW (ref 6.5–8.1)

## 2019-11-01 LAB — CBC
HCT: 41.3 % (ref 39.0–52.0)
Hemoglobin: 14.1 g/dL (ref 13.0–17.0)
MCH: 30.3 pg (ref 26.0–34.0)
MCHC: 34.1 g/dL (ref 30.0–36.0)
MCV: 88.6 fL (ref 80.0–100.0)
Platelets: 281 10*3/uL (ref 150–400)
RBC: 4.66 MIL/uL (ref 4.22–5.81)
RDW: 12.5 % (ref 11.5–15.5)
WBC: 21.6 10*3/uL — ABNORMAL HIGH (ref 4.0–10.5)
nRBC: 0 % (ref 0.0–0.2)

## 2019-11-01 LAB — URINALYSIS, ROUTINE W REFLEX MICROSCOPIC
Bacteria, UA: NONE SEEN
Bilirubin Urine: NEGATIVE
Glucose, UA: >=500 mg/dL — AB
Ketones, ur: NEGATIVE mg/dL
Leukocytes,Ua: NEGATIVE
Nitrite: NEGATIVE
Protein, ur: NEGATIVE mg/dL
Specific Gravity, Urine: 1.011 (ref 1.005–1.030)
pH: 5 (ref 5.0–8.0)

## 2019-11-01 LAB — GLUCOSE, CAPILLARY
Glucose-Capillary: 219 mg/dL — ABNORMAL HIGH (ref 70–99)
Glucose-Capillary: 187 mg/dL — ABNORMAL HIGH (ref 70–99)
Glucose-Capillary: 177 mg/dL — ABNORMAL HIGH (ref 70–99)

## 2019-11-01 LAB — IRON AND TIBC
Iron: 18 ug/dL — ABNORMAL LOW (ref 45–182)
Saturation Ratios: 7 % — ABNORMAL LOW (ref 17.9–39.5)
TIBC: 247 ug/dL — ABNORMAL LOW (ref 250–450)
UIBC: 229 ug/dL

## 2019-11-01 LAB — PROCALCITONIN: Procalcitonin: 30.98 ng/mL

## 2019-11-01 LAB — PROTIME-INR
INR: 1.1 (ref 0.8–1.2)
Prothrombin Time: 14.2 seconds (ref 11.4–15.2)

## 2019-11-01 LAB — HEMOGLOBIN AND HEMATOCRIT, BLOOD
HCT: 38.3 % — ABNORMAL LOW (ref 39.0–52.0)
Hemoglobin: 13 g/dL (ref 13.0–17.0)

## 2019-11-01 LAB — BASIC METABOLIC PANEL
Anion gap: 7 (ref 5–15)
BUN: 19 mg/dL (ref 6–20)
CO2: 20 mmol/L — ABNORMAL LOW (ref 22–32)
Calcium: 7.7 mg/dL — ABNORMAL LOW (ref 8.9–10.3)
Chloride: 108 mmol/L (ref 98–111)
Creatinine, Ser: 1.12 mg/dL (ref 0.61–1.24)
GFR, Estimated: >60 mL/min (ref >60)
Glucose, Bld: 219 mg/dL — ABNORMAL HIGH (ref 70–99)
Potassium: 3.8 mmol/L (ref 3.5–5.1)
Sodium: 135 mmol/L (ref 135–145)

## 2019-11-01 LAB — APTT: aPTT: 26 seconds (ref 24–36)

## 2019-11-01 LAB — RETICULOCYTES
Immature Retic Fract: 8.3 % (ref 2.3–15.9)
RBC.: 4.23 MIL/uL (ref 4.22–5.81)
Retic Count, Absolute: 60.9 10*3/uL (ref 19.0–186.0)
Retic Ct Pct: 1.4 % (ref 0.4–3.1)

## 2019-11-01 LAB — MAGNESIUM: Magnesium: 1.8 mg/dL (ref 1.7–2.4)

## 2019-11-01 LAB — VITAMIN B12: Vitamin B-12: 685 pg/mL (ref 180–914)

## 2019-11-01 LAB — BILIRUBIN, DIRECT
Bilirubin, Direct: 0.3 mg/dL — ABNORMAL HIGH (ref 0.0–0.2)
Bilirubin, Direct: 0.3 mg/dL — ABNORMAL HIGH (ref 0.0–0.2)

## 2019-11-01 LAB — PHOSPHORUS: Phosphorus: 1.7 mg/dL — ABNORMAL LOW (ref 2.5–4.6)

## 2019-11-01 LAB — FOLATE: Folate: 5.8 ng/mL — ABNORMAL LOW (ref >5.9)

## 2019-11-01 LAB — HIV ANTIBODY (ROUTINE TESTING W REFLEX): HIV Screen 4th Generation wRfx: NONREACTIVE

## 2019-11-01 LAB — FERRITIN: Ferritin: 201 ng/mL (ref 24–336)

## 2019-11-01 MED ORDER — INSULIN ASPART 100 UNIT/ML ~~LOC~~ SOLN: 0.0000 [IU] | Freq: Three times a day (TID) | SUBCUTANEOUS | Status: DC

## 2019-11-01 MED ORDER — SODIUM CHLORIDE 0.9 % IV SOLN: INTRAVENOUS | Status: AC

## 2019-11-01 MED ORDER — DICLOFENAC SODIUM 75 MG PO TBEC
75.0000 mg | DELAYED_RELEASE_TABLET | Freq: Two times a day (BID) | ORAL | Status: DC
  Administered 2019-11-01 (×2): 75 mg via ORAL
  Filled 2019-11-01 (×3): qty 1

## 2019-11-01 MED ORDER — HYDROMORPHONE HCL 4 MG PO TABS
4.0000 mg | ORAL_TABLET | ORAL | Status: DC | PRN
  Administered 2019-11-01 (×2): 4 mg via ORAL
  Filled 2019-11-01 (×3): qty 1

## 2019-11-01 MED ORDER — LABETALOL HCL 5 MG/ML IV SOLN
10.0000 mg | INTRAVENOUS | Status: DC | PRN
  Administered 2019-11-01: 10 mg via INTRAVENOUS
  Filled 2019-11-01: qty 4

## 2019-11-01 MED ORDER — SENNOSIDES-DOCUSATE SODIUM 8.6-50 MG PO TABS
1.0000 | ORAL_TABLET | Freq: Two times a day (BID) | ORAL | Status: DC
  Administered 2019-11-01 (×2): 1 via ORAL
  Filled 2019-11-01 (×3): qty 1

## 2019-11-01 MED ORDER — SODIUM CHLORIDE 0.9 % IV BOLUS
1000.0000 mL | Freq: Once | INTRAVENOUS | Status: AC
  Administered 2019-11-01: 1000 mL via INTRAVENOUS

## 2019-11-01 MED ORDER — METRONIDAZOLE IN NACL 5-0.79 MG/ML-% IV SOLN
500.0000 mg | Freq: Three times a day (TID) | INTRAVENOUS | Status: DC
  Administered 2019-11-01 – 2019-11-02 (×2): 500 mg via INTRAVENOUS
  Filled 2019-11-01 (×3): qty 100

## 2019-11-01 MED ORDER — INSULIN ASPART 100 UNIT/ML ~~LOC~~ SOLN: 0.0000 [IU] | Freq: Every day | SUBCUTANEOUS | Status: DC

## 2019-11-01 NOTE — Care Management Obs Status (Signed)
[place] NOTIFICATION   Patient Details  Name: Duane [name] MRN: [id number] Date of Birth: 22-Sep-1960   Medicare Observation Status Notification Given:  Yes Larene Beach, RN agreed to deliver letter)    Tommy Medal 11/01/2019, 5:40 PM

## 2019-11-01 NOTE — Telephone Encounter (Signed)
Please schedule hospital follow up for constipation, heme + stool per request of Dr. Manuella Ghazi ([facility]).

## 2019-11-01 NOTE — Consult Note (Addendum)
I was present with the medical student for this service. I personally verified the history of present illness, performed the physical exam, and made the plan for this encounter. I have verified the medical student's documentation and made modifications where appropriately. I have personally documented in my own words a brief history, physical, and plan below.     Patient with constipation and impaction. Having Bms now. No fevers. Abdominal pain in the lower abdomen. Some tenderness in the RUQ but mild. No reported pain with greasy foods or issues reflecting gallbladder on report.  Soft, mildly distended, mildly tender lower abdomen  WBC remains high but trending down without antibiotics. No etiology identified on CT and I personally reviewed this and only identify colon with stool and redundancy. If unable to determine etiology outside of stress response and concerned could do [place] to rule out cholecystitis. T bili was mildly elevated. Otherwise continue bowel regimen. Follow up with PCP.  Discussed with Dr. Manuella Ghazi.   Curlene Labrum, MD [facility] [place], [place] [address] [phone] (office)    [place]  Reason for Consult: Abdominal Pain Referring Physician: ED  Chief Complaint    Abdominal Pain      HPI: Duane [name] is a 59 y.o. male with PMH significant for hypertension, arthritis, chronic neck and back pain, and internal hemorrhoids who presents w/ 4 days of nausea and worsening abdominal pain w/ no BMs in the interval. Denies fever, dysuria, urinary frequency, visible hematuria, or vomiting. Pain is worst in RLQ but pt reports diffuse abdominal discomfort. Medication hx significant for opioid use for mgmt of chronic pain. Since fecal disimpaction done yesterday in the ED, pt reports loose, watery stools with some blood visible ocurring every 30-60 minutes overnight. Pt endorses continued nausea, improved  abdominal pain with residual abdominal soreness, and fatigue this morning.     Past Medical History:  Diagnosis Date  . Arthritis   . Gout   . Hypertension     Past Surgical History:  Procedure Laterality Date  . ELBOW SURGERY    . FOOT FASCIOTOMY     X 5  . HEMORRHOID SURGERY     X2, Dr. Arnoldo Morale  . NECK EXPLORATION    . SHOULDER ARTHROTOMY     multiple     Family History  Problem Relation Age of Onset  . Diabetes Mother   . COPD Father   . Colon cancer Neg Hx     Social History   Tobacco Use  . Smoking status: Never Smoker  . Smokeless tobacco: Never Used  Vaping Use  . Vaping Use: Never used  Substance Use Topics  . Alcohol use: Never  . Drug use: Never    Medications: I have reviewed the patient's current medications. Current Facility-Administered Medications  Medication Dose Route Frequency Provider Last Rate Last Admin  . diclofenac (VOLTAREN) EC tablet 75 mg  75 mg Oral BID Manuella Ghazi, Pratik D, DO      . HYDROmorphone (DILAUDID) tablet 4 mg  4 mg Oral Q4H PRN Manuella Ghazi, Pratik D, DO      . insulin aspart (novoLOG) injection 0-5 Units  0-5 Units Subcutaneous QHS Shah, Pratik D, DO      . insulin aspart (novoLOG) injection 0-9 Units  0-9 Units Subcutaneous TID WC Shah, Pratik D, DO      . labetalol (NORMODYNE) injection 10 mg  10 mg Intravenous Q10 min PRN Lang Snow, FNP      .  ondansetron (ZOFRAN) injection 4 mg  4 mg Intravenous Q8H PRN Lang Snow, FNP   4 mg at 11/01/19 1110  . polyethylene glycol (MIRALAX / GLYCOLAX) packet 17 g  17 g Oral Daily Lang Snow, FNP   17 g at 11/01/19 0959  . senna-docusate (Senokot-S) tablet 1 tablet  1 tablet Oral BID Heath Lark D, DO   1 tablet at 11/01/19 1001   Allergies  Allergen Reactions  . Codeine   . Escitalopram Other (See Comments)    Patient states it caused night terrors  . Hydrocodone Nausea And Vomiting  . Oxycodone Nausea And Vomiting  . Penicillins Nausea Only    .Has patient had a PCN reaction  causing immediate rash, facial/tongue/throat swelling, SOB or lightheadedness with hypotension: No Has patient had a PCN reaction causing severe rash involving mucus membranes or skin necrosis: No  Has patient had a PCN reaction that required hospitalization: No Has patient had a PCN reaction occurring within the last 10 years: No If all of the above answers are "NO", then may proceed with Cephalosporin use.   Marland Kitchen Penicillins   . Trazodone Other (See Comments)    Increased BP and HR per patient  . Levofloxacin Rash     ROS:  Pertinent items noted in HPI and remainder of comprehensive ROS otherwise negative.  Blood pressure 109/79, pulse (!) 109, temperature 98.5 F (36.9 C), temperature source Oral, resp. rate 19, height 5\' 9"  (1.753 m), weight 70.3 kg, SpO2 97 %. Physical Exam Constitutional:      Appearance: He is well-developed.  HENT:     Head: Normocephalic.  Eyes:     General: No scleral icterus. Cardiovascular:     Rate and Rhythm: Normal rate and regular rhythm.     Heart sounds: Normal heart sounds.  Pulmonary:     Effort: Pulmonary effort is normal. No respiratory distress.     Breath sounds: Normal breath sounds.  Abdominal:     General: Bowel sounds are increased. There is distension.     Palpations: Abdomen is rigid.     Tenderness: There is generalized abdominal tenderness and tenderness in the right lower quadrant. There is no guarding or rebound. Negative signs include McBurney's sign.  Skin:    General: Skin is warm and dry.  Neurological:     General: No focal deficit present.     Mental Status: He is alert.  Psychiatric:        Mood and Affect: Mood normal.        Behavior: Behavior normal.     Results: Results for orders placed or performed during the hospital encounter of 10/31/19 (from the past 48 hour(s))  Lipase, blood     Status: None   Collection Time: 10/31/19  6:38 PM  Result Value Ref Range   Lipase 43 11 - 51 U/L    Comment: Performed at  [facility], [address] [place], [place] [address]  Comprehensive metabolic panel     Status: Abnormal   Collection Time: 10/31/19  6:38 PM  Result Value Ref Range   Sodium 137 135 - 145 mmol/L   Potassium 4.1 3.5 - 5.1 mmol/L   Chloride 101 98 - 111 mmol/L   CO2 16 (L) 22 - 32 mmol/L   Glucose, Bld 296 (H) 70 - 99 mg/dL    Comment: Glucose reference range applies only to samples taken after fasting for at least 8 hours.   BUN 20 6 - 20 mg/dL  Creatinine, Ser 1.58 (H) 0.61 - 1.24 mg/dL   Calcium 9.5 8.9 - 10.3 mg/dL   Total Protein 8.0 6.5 - 8.1 g/dL   Albumin 4.4 3.5 - 5.0 g/dL   AST 36 15 - 41 U/L   ALT 28 0 - 44 U/L   Alkaline Phosphatase 103 38 - 126 U/L   Total Bilirubin 1.7 (H) 0.3 - 1.2 mg/dL   GFR, Estimated 48 (L) >60 mL/min   Anion gap 20 (H) 5 - 15    Comment: Performed at [facility], [address], [place], [place] [address]  CBC     Status: Abnormal   Collection Time: 10/31/19  6:38 PM  Result Value Ref Range   WBC 24.1 (H) 4.0 - 10.5 K/uL   RBC 5.72 4.22 - 5.81 MIL/uL   Hemoglobin 17.6 (H) 13.0 - 17.0 g/dL   HCT 50.9 39 - 52 %   MCV 89.0 80.0 - 100.0 fL   MCH 30.8 26.0 - 34.0 pg   MCHC 34.6 30.0 - 36.0 g/dL   RDW 12.2 11.5 - 15.5 %   Platelets 348 150 - 400 K/uL   nRBC 0.0 0.0 - 0.2 %    Comment: Performed at [facility], [address], [place], [place] [address]  Differential     Status: Abnormal   Collection Time: 10/31/19  6:38 PM  Result Value Ref Range   Neutrophils Relative % 87 %   Neutro Abs 21.3 (H) 1.7 - 7.7 K/uL   Lymphocytes Relative 7 %   Lymphs Abs 1.6 0.7 - 4.0 K/uL   Monocytes Relative 5 %   Monocytes Absolute 1.1 (H) 0.1 - 1.0 K/uL   Eosinophils Relative 0 %   Eosinophils Absolute 0.0 0.0 - 0.5 K/uL   Basophils Relative 0 %   Basophils Absolute 0.0 0.0 - 0.1 K/uL   Immature Granulocytes 1 %   Abs Immature Granulocytes 0.21 (H) 0.00 - 0.07 K/uL    Comment: Performed at [facility], [address], [place], [place] [address]   Type and screen     Status: None   Collection Time: 10/31/19  7:40 PM  Result Value Ref Range   ABO/RH(D) AB POS    Antibody Screen NEG    Sample Expiration      11/03/2019,2359 Performed at [facility], [address], [place], [place] [address]   Respiratory Panel by RT PCR (Flu A&B, Covid) - Nasopharyngeal Swab     Status: None   Collection Time: 10/31/19  8:05 PM   Specimen: Nasopharyngeal Swab  Result Value Ref Range   SARS Coronavirus 2 by RT PCR NEGATIVE NEGATIVE    Comment: (NOTE) SARS-CoV-2 target nucleic acids are NOT DETECTED.  The SARS-CoV-2 RNA is generally detectable in upper respiratoy specimens during the acute phase of infection. The lowest concentration of SARS-CoV-2 viral copies this assay can detect is 131 copies/mL. A negative result does not preclude SARS-Cov-2 infection and should not be used as the sole basis for treatment or other patient management decisions. A negative result may occur with  improper specimen collection/handling, submission of specimen other than nasopharyngeal swab, presence of viral mutation(s) within the areas targeted by this assay, and inadequate number of viral copies (<131 copies/mL). A negative result must be combined with clinical observations, patient history, and epidemiological information. The expected result is Negative.  Fact Sheet for Patients:  PinkCheek.be  Fact Sheet for Healthcare Providers:  GravelBags.it  This test is no t yet approved or cleared  by the [place] and  has been authorized for detection and/or diagnosis of SARS-CoV-2 by FDA under an Emergency Use Authorization (EUA). This EUA will remain  in effect (meaning this test can be used) for the duration of the COVID-19 declaration under Section 564(b)(1) of the Act, 21 U.S.C. section 360bbb-3(b)(1), unless the authorization is terminated or revoked sooner.     Influenza A by PCR  NEGATIVE NEGATIVE   Influenza B by PCR NEGATIVE NEGATIVE    Comment: (NOTE) The Xpert Xpress SARS-CoV-2/FLU/RSV assay is intended as an aid in  the diagnosis of influenza from Nasopharyngeal swab specimens and  should not be used as a sole basis for treatment. Nasal washings and  aspirates are unacceptable for Xpert Xpress SARS-CoV-2/FLU/RSV  testing.  Fact Sheet for Patients: PinkCheek.be  Fact Sheet for Healthcare Providers: GravelBags.it  This test is not yet approved or cleared by the [place] FDA and  has been authorized for detection and/or diagnosis of SARS-CoV-2 by  FDA under an Emergency Use Authorization (EUA). This EUA will remain  in effect (meaning this test can be used) for the duration of the  Covid-19 declaration under Section 564(b)(1) of the Act, 21  U.S.C. section 360bbb-3(b)(1), unless the authorization is  terminated or revoked. Performed at [facility], [address] [place], [place] [address]   Comprehensive metabolic panel     Status: Abnormal   Collection Time: 11/01/19  5:58 AM  Result Value Ref Range   Sodium 137 135 - 145 mmol/L   Potassium 4.9 3.5 - 5.1 mmol/L   Chloride 105 98 - 111 mmol/L   CO2 18 (L) 22 - 32 mmol/L   Glucose, Bld 335 (H) 70 - 99 mg/dL    Comment: Glucose reference range applies only to samples taken after fasting for at least 8 hours.   BUN 22 (H) 6 - 20 mg/dL   Creatinine, Ser 1.43 (H) 0.61 - 1.24 mg/dL   Calcium 8.2 (L) 8.9 - 10.3 mg/dL   Total Protein 6.3 (L) 6.5 - 8.1 g/dL   Albumin 3.3 (L) 3.5 - 5.0 g/dL   AST 25 15 - 41 U/L   ALT 22 0 - 44 U/L   Alkaline Phosphatase 58 38 - 126 U/L   Total Bilirubin 1.5 (H) 0.3 - 1.2 mg/dL   GFR, Estimated 54 (L) >60 mL/min   Anion gap 14 5 - 15    Comment: Performed at [facility], [address], [place], [place] [address]  CBC     Status: Abnormal   Collection Time: 11/01/19  5:58 AM  Result Value Ref Range   WBC  21.6 (H) 4.0 - 10.5 K/uL   RBC 4.66 4.22 - 5.81 MIL/uL   Hemoglobin 14.1 13.0 - 17.0 g/dL   HCT 41.3 39 - 52 %   MCV 88.6 80.0 - 100.0 fL   MCH 30.3 26.0 - 34.0 pg   MCHC 34.1 30.0 - 36.0 g/dL   RDW 12.5 11.5 - 15.5 %   Platelets 281 150 - 400 K/uL   nRBC 0.0 0.0 - 0.2 %    Comment: Performed at [facility], [address] [place], [place] [address]  Protime-INR     Status: None   Collection Time: 11/01/19  5:58 AM  Result Value Ref Range   Prothrombin Time 14.2 11.4 - 15.2 seconds   INR 1.1 0.8 - 1.2    Comment: (NOTE) INR goal varies based on device and disease states. Performed at [facility]  [facility], [address], [place], [place] [address]   APTT     Status: None   Collection Time: 11/01/19  5:58 AM  Result Value Ref Range   aPTT 26 24 - 36 seconds    Comment: Performed at [facility], [address], [place], [place] [address]  Magnesium     Status: None   Collection Time: 11/01/19  5:58 AM  Result Value Ref Range   Magnesium 1.8 1.7 - 2.4 mg/dL    Comment: Performed at [facility], [address], [place], [place] [address]  Phosphorus     Status: Abnormal   Collection Time: 11/01/19  5:58 AM  Result Value Ref Range   Phosphorus 1.7 (L) 2.5 - 4.6 mg/dL    Comment: Performed at [facility], [address] [place], [place] [address]  Bilirubin, direct     Status: Abnormal   Collection Time: 11/01/19  5:58 AM  Result Value Ref Range   Bilirubin, Direct 0.3 (H) 0.0 - 0.2 mg/dL    Comment: Performed at [facility], [address] [place], [place] [address]    DG Abdomen 1 View  Result Date: 10/31/2019 CLINICAL DATA:  Abdomen pain EXAM: ABDOMEN - 1 VIEW COMPARISON:  None. FINDINGS: Nonobstructed bowel-gas pattern. No radiopaque calculi are visualized. IMPRESSION: Negative. Electronically Signed   By: Donavan Foil M.D.   On: 10/31/2019 18:52   CT ABDOMEN PELVIS W CONTRAST  Result Date: 10/31/2019 CLINICAL DATA:  59 year old male with sudden onset severe abdominal pain  1430 hours. EXAM: CT ABDOMEN AND PELVIS WITH CONTRAST TECHNIQUE: Multidetector CT imaging of the abdomen and pelvis was performed using the standard protocol following bolus administration of intravenous contrast. CONTRAST:  29mL OMNIPAQUE IOHEXOL 300 MG/ML  SOLN COMPARISON:  KUB at 1842 hours today. CT Abdomen and Pelvis 08/26/2016 FINDINGS: Lower chest: Resolved right lower lobe infection seen in 2018. Minor residual atelectasis or scarring at the right lung base. Otherwise negative lower chest. No pericardial or pleural effusion. Hepatobiliary: Mild interposition of redundant hepatic flexure now between portion of the right hepatic lobe and lateral diaphragm. Evidence of chronic hepatic steatosis. But otherwise negative liver and gallbladder. No bile duct enlargement. Pancreas: Diminutive, negative. Spleen: Negative. Adrenals/Urinary Tract: Normal adrenal glands. Bilateral renal enhancement is normal. There is a small benign appearing left renal midpole cyst (series 7, image 22). Only early contrast excretion to the renal pyramids on the delayed images, but no hydronephrosis, hydroureter, or perinephric stranding. Decompressed and negative ureters. No urinary calculus. Unremarkable urinary bladder. Stomach/Bowel: Redundant large bowel with retained stool throughout. Increased stool ball in the rectum compared to 2018 (series 2, image 91). Primarily liquid stool in the redundant right and transverse colons. No large bowel inflammation. Normal appendix on series 2, image 70. Negative terminal ileum. No dilated or abnormal small bowel. Stomach moderately distended with fluid but otherwise negative. Decompressed and negative duodenum. No free air, free fluid, mesenteric stranding. Vascular/Lymphatic: Major arterial structures are patent and appear normal. Portal venous system is patent. No lymphadenopathy. Reproductive: Negative. Other: No pelvic free fluid. Musculoskeletal: Negative. IMPRESSION: 1. Redundant large  bowel with retained stool throughout. Increased stool ball in the rectum since 2018. Query constipation, fecal impaction. 2. Otherwise no acute or inflammatory process identified in the abdomen or pelvis. Normal appendix. 3. Chronic hepatic steatosis. Electronically Signed   By: Genevie Ann M.D.   On: 10/31/2019 19:56     Assessment & Plan:  Duane [name] is a 59 y.o.  male with PMH that includes internal hemorrhoids and chronic pain managed with opioids who presents w/ 4 days abdominal pain and constipation and with frequent BMs following fecal disimpaction w/ PE significant for active bowel sound w/ no rebound or guarding and labs which are significant for elevated WBC and bilirubin.   Given pt's hx of opioid use, reported constipation, and multiple BMs w/ improvement in abdominal pain following fecal disimpaction points to opioid-induced constipation as the likely cause of this pt's discomfort. Blood visible in stool likely function of pt's known hemorrhoids.   Low suspicion for surgical abdomen given active bowel sounds, lack of rebound pain or other peritoneal signs on PE.  Elevations in WBC and bilirubin not well explained by constipation or internal hemorrhoids. Can consider RUQ [place] to r/o gallbladder pathology given right-sided abdominal pain, though less likely given pain is in RLQ rather than RUQ. Consider UA to r/o UTI, though less likely given no urinary sx.   No surgical intervention or f/u indicated at this time.   All questions were answered to the satisfaction of the patient.   Raliegh Ip 11/01/2019, 11:11 AM

## 2019-11-01 NOTE — Progress Notes (Signed)
PROGRESS NOTE    Duane [name]  WLN:[id number] DOB: 07-07-1960 DOA: 10/31/2019 PCP: Maximiano Coss, NP   Brief Narrative:  Per HPI: Duane [name] is a 59 y.o. male with medical history significant for hypertension, arthritis, chronic neck and back pain (follows with pain clinic at [facility]) and internal hemorrhoids who presents to the emergency department due to lower abdominal pain which started several hours prior to arrival to the ED.  Patient states that he has not had any bowel movement in 4 days and that this afternoon, he endorsed extreme straining while trying to have a bowel movement, this was associated with excessive sweating and palpitations, patient states that he thought he was having a heart attack, so he activated EMS.  He denies chest pain, shortness of breath, headache, blurry vision, nausea or vomiting.  ED Course:  In the emergency department, he was tachycardic and tachypneic, but other vital signs were within normal range.  Work-up in the ED showed leukocytosis, hemoglobin 17.6, hematocrit 50.9, creatinine 1.58 (creatinine was 1.58-month ago), total bilirubin 1.7.  Influenza A and B negative, SARS coronavirus 2 was negative.  CT abdomen and pelvis with contrast showed redundant large bowel with retained stool throughout.  Increased stool ball in the rectum since 2018.  Query constipation, fecal impaction.  Abdominal x-ray showed nonobstructive bowel gas pattern, no radiopaque calculi are visualized. Fecal disimpaction was done in the ED with a moderate amount of hard stool balls from rectum and with blood noted on stool.  Stool occult was heme positive and no anal fissure was noted per ED physician's note.  General surgery (Dr. Constance Haw) was consulted and will see patient in the morning per ED physician.  10/21: Patient was admitted with abdominal pain presumably secondary to constipation in the setting of chronic opioid use.  He unfortunately has significant leukocytosis as  well as elevated procalcitonin levels as well as some hyperglycemia with no prior history of type 2 diabetes.  He has been seen by general surgery with recommendation to obtain ultrasound of the right upper quadrant for evaluation of cholecystitis.  This will be obtained at this time with IV antibiotics initiated for empiric treatment.  Assessment & Plan:   Principal Problem:   Abdominal pain Active Problems:   Chronic neck and back pain   Essential hypertension   Hyperglycemia   Constipation   Leukocytosis   Dehydration   Hepatic steatosis   SIRS (systemic inflammatory response syndrome) (HCC)   Internal hemorrhoids   Acute kidney injury superimposed on CKD (HCC)   Serum total bilirubin elevated   Abdominal pain uncertain etiology -Appears to have been related to constipation with chronic use of opioids as this has improved after bowel movement -There is a question of possible cholecystitis for which abdominal ultrasound has been ordered -CT abdomen without any acute findings, but T bili elevated -Empirically start IV Flagyl for now and keep n.p.o. after midnight -Appreciate general surgery evaluation -Follow-up labs in a.m.  Leukocytosis of uncertain etiology -Question if this is related to cholecystitis -Procalcitonin also elevated -Start IV Flagyl empirically and follow in a.m.  Chronic neck and back pain -Continue chronic home opioids for pain management  AKI-resolved -He is back to baseline levels of creatinine with IV fluid hydration -Avoid nephrotoxic agents to include his ACE inhibitor for now and monitor  Internal hemorrhoids with rectal bleeding -Hemoglobin appears to have stabilized -We will likely require GI follow-up in the outpatient setting which will be arranged  Essential hypertension -Blood  pressures currently stable -Holding ACE inhibitor for now  Chronic hepatic steatosis -Further evaluation with ultrasound abdomen as ordered  Elevated total  bilirubin -Further evaluation with ultrasound abdomen ordered -Direct bilirubin not elevated   DVT prophylaxis:SCDs Code Status: Full Family Communication: None at bedside Disposition Plan:  Status is: Observation  The patient will require care spanning > 2 midnights and should be moved to inpatient because: IV treatments appropriate due to intensity of illness or inability to take PO and Inpatient level of care appropriate due to severity of illness  Dispo: The patient is from: Home              Anticipated d/c is to: Home              Anticipated d/c date is: 1 day              Patient currently is not medically stable to d/c.  Consultants:   General surgery  Procedures:   See below  Antimicrobials:  Anti-infectives (From admission, onward)   Start     Dose/Rate Route Frequency Ordered Stop   11/01/19 1715  metroNIDAZOLE (FLAGYL) IVPB 500 mg        500 mg 100 mL/hr over 60 Minutes Intravenous Every 8 hours 11/01/19 1625         Subjective: Patient seen and evaluated today with some ongoing right lower quadrant abdominal pain as well as some rectal bleeding noted that is ongoing.  He denies any other symptomatic complaints or concerns and is otherwise able to tolerate diet.  No nausea or vomiting noted.  Objective: Vitals:   10/31/19 2328 11/01/19 0133 11/01/19 1117 11/01/19 1429  BP: (!) 144/126 109/79 140/87 123/80  Pulse: (!) 114 (!) 109 (!) 104 95  Resp:  19 18 18   Temp:   98.7 F (37.1 C) 98.4 F (36.9 C)  TempSrc:   Oral Oral  SpO2: 99% 97% 97% 96%  Weight:      Height:        Intake/Output Summary (Last 24 hours) at 11/01/2019 1632 Last data filed at 11/01/2019 0300 Gross per 24 hour  Intake 238.97 ml  Output --  Net 238.97 ml   Filed Weights   10/31/19 1806  Weight: 70.3 kg    Examination:  General exam: Appears calm and comfortable  Respiratory system: Clear to auscultation. Respiratory effort normal. Cardiovascular system: S1 & S2  heard, RRR. Gastrointestinal system: Abdomen is nondistended, soft and nontender.  Central nervous system: Alert and oriented. No focal neurological deficits. Extremities: Symmetric 5 x 5 power.  Right arm contracture. Skin: No rashes, lesions or ulcers Psychiatry: Judgement and insight appear normal. Mood & affect appropriate.     Data Reviewed: I have personally reviewed following labs and imaging studies  CBC: Recent Labs  Lab 10/31/19 1838 11/01/19 0558 11/01/19 1402  WBC 24.1* 21.6*  --   NEUTROABS 21.3*  --   --   HGB 17.6* 14.1 13.0  HCT 50.9 41.3 38.3*  MCV 89.0 88.6  --   PLT 348 281  --    Basic Metabolic Panel: Recent Labs  Lab 10/31/19 1838 11/01/19 0558 11/01/19 1402  NA 137 137 135  K 4.1 4.9 3.8  CL 101 105 108  CO2 16* 18* 20*  GLUCOSE 296* 335* 219*  BUN 20 22* 19  CREATININE 1.58* 1.43* 1.12  CALCIUM 9.5 8.2* 7.7*  MG  --  1.8  --   PHOS  --  1.7*  --  GFR: Estimated Creatinine Clearance: 72.4 mL/min (by C-G formula based on SCr of 1.12 mg/dL). Liver Function Tests: Recent Labs  Lab 10/31/19 1838 11/01/19 0558  AST 36 25  ALT 28 22  ALKPHOS 103 58  BILITOT 1.7* 1.5*  PROT 8.0 6.3*  ALBUMIN 4.4 3.3*   Recent Labs  Lab 10/31/19 1838  LIPASE 43   No results for input(s): AMMONIA in the last 168 hours. Coagulation Profile: Recent Labs  Lab 11/01/19 0558  INR 1.1   Cardiac Enzymes: No results for input(s): CKTOTAL, CKMB, CKMBINDEX, TROPONINI in the last 168 hours. BNP (last 3 results) No results for input(s): PROBNP in the last 8760 hours. HbA1C: No results for input(s): HGBA1C in the last 72 hours. CBG: Recent Labs  Lab 11/01/19 1332  GLUCAP 219*   Lipid Profile: No results for input(s): CHOL, HDL, LDLCALC, TRIG, CHOLHDL, LDLDIRECT in the last 72 hours. Thyroid Function Tests: No results for input(s): TSH, T4TOTAL, FREET4, T3FREE, THYROIDAB in the last 72 hours. Anemia Panel: Recent Labs    11/01/19 1402   VITAMINB12 685  FOLATE 5.8*  FERRITIN 201  TIBC 247*  IRON 18*  RETICCTPCT 1.4   Sepsis Labs: Recent Labs  Lab 11/01/19 1402  PROCALCITON 30.98    Recent Results (from the past 240 hour(s))  Respiratory Panel by RT PCR (Flu A&B, Covid) - Nasopharyngeal Swab     Status: None   Collection Time: 10/31/19  8:05 PM   Specimen: Nasopharyngeal Swab  Result Value Ref Range Status   SARS Coronavirus 2 by RT PCR NEGATIVE NEGATIVE Final    Comment: (NOTE) SARS-CoV-2 target nucleic acids are NOT DETECTED.  The SARS-CoV-2 RNA is generally detectable in upper respiratoy specimens during the acute phase of infection. The lowest concentration of SARS-CoV-2 viral copies this assay can detect is 131 copies/mL. A negative result does not preclude SARS-Cov-2 infection and should not be used as the sole basis for treatment or other patient management decisions. A negative result may occur with  improper specimen collection/handling, submission of specimen other than nasopharyngeal swab, presence of viral mutation(s) within the areas targeted by this assay, and inadequate number of viral copies (<131 copies/mL). A negative result must be combined with clinical observations, patient history, and epidemiological information. The expected result is Negative.  Fact Sheet for Patients:  PinkCheek.be  Fact Sheet for Healthcare Providers:  GravelBags.it  This test is no t yet approved or cleared by the [place] FDA and  has been authorized for detection and/or diagnosis of SARS-CoV-2 by FDA under an Emergency Use Authorization (EUA). This EUA will remain  in effect (meaning this test can be used) for the duration of the COVID-19 declaration under Section 564(b)(1) of the Act, 21 U.S.C. section 360bbb-3(b)(1), unless the authorization is terminated or revoked sooner.     Influenza A by PCR NEGATIVE NEGATIVE Final   Influenza B by  PCR NEGATIVE NEGATIVE Final    Comment: (NOTE) The Xpert Xpress SARS-CoV-2/FLU/RSV assay is intended as an aid in  the diagnosis of influenza from Nasopharyngeal swab specimens and  should not be used as a sole basis for treatment. Nasal washings and  aspirates are unacceptable for Xpert Xpress SARS-CoV-2/FLU/RSV  testing.  Fact Sheet for Patients: PinkCheek.be  Fact Sheet for Healthcare Providers: GravelBags.it  This test is not yet approved or cleared by the [place] FDA and  has been authorized for detection and/or diagnosis of SARS-CoV-2 by  FDA under an Emergency Use Authorization (EUA). This EUA  will remain  in effect (meaning this test can be used) for the duration of the  Covid-19 declaration under Section 564(b)(1) of the Act, 21  U.S.C. section 360bbb-3(b)(1), unless the authorization is  terminated or revoked. Performed at [facility], [address] [place], [place] [address]          Radiology Studies: DG Abdomen 1 View  Result Date: 10/31/2019 CLINICAL DATA:  Abdomen pain EXAM: ABDOMEN - 1 VIEW COMPARISON:  None. FINDINGS: Nonobstructed bowel-gas pattern. No radiopaque calculi are visualized. IMPRESSION: Negative. Electronically Signed   By: Donavan Foil M.D.   On: 10/31/2019 18:52   CT ABDOMEN PELVIS W CONTRAST  Result Date: 10/31/2019 CLINICAL DATA:  59 year old male with sudden onset severe abdominal pain 1430 hours. EXAM: CT ABDOMEN AND PELVIS WITH CONTRAST TECHNIQUE: Multidetector CT imaging of the abdomen and pelvis was performed using the standard protocol following bolus administration of intravenous contrast. CONTRAST:  89mL OMNIPAQUE IOHEXOL 300 MG/ML  SOLN COMPARISON:  KUB at 1842 hours today. CT Abdomen and Pelvis 08/26/2016 FINDINGS: Lower chest: Resolved right lower lobe infection seen in 2018. Minor residual atelectasis or scarring at the right lung base. Otherwise negative lower chest.  No pericardial or pleural effusion. Hepatobiliary: Mild interposition of redundant hepatic flexure now between portion of the right hepatic lobe and lateral diaphragm. Evidence of chronic hepatic steatosis. But otherwise negative liver and gallbladder. No bile duct enlargement. Pancreas: Diminutive, negative. Spleen: Negative. Adrenals/Urinary Tract: Normal adrenal glands. Bilateral renal enhancement is normal. There is a small benign appearing left renal midpole cyst (series 7, image 22). Only early contrast excretion to the renal pyramids on the delayed images, but no hydronephrosis, hydroureter, or perinephric stranding. Decompressed and negative ureters. No urinary calculus. Unremarkable urinary bladder. Stomach/Bowel: Redundant large bowel with retained stool throughout. Increased stool ball in the rectum compared to 2018 (series 2, image 91). Primarily liquid stool in the redundant right and transverse colons. No large bowel inflammation. Normal appendix on series 2, image 70. Negative terminal ileum. No dilated or abnormal small bowel. Stomach moderately distended with fluid but otherwise negative. Decompressed and negative duodenum. No free air, free fluid, mesenteric stranding. Vascular/Lymphatic: Major arterial structures are patent and appear normal. Portal venous system is patent. No lymphadenopathy. Reproductive: Negative. Other: No pelvic free fluid. Musculoskeletal: Negative. IMPRESSION: 1. Redundant large bowel with retained stool throughout. Increased stool ball in the rectum since 2018. Query constipation, fecal impaction. 2. Otherwise no acute or inflammatory process identified in the abdomen or pelvis. Normal appendix. 3. Chronic hepatic steatosis. Electronically Signed   By: Genevie Ann M.D.   On: 10/31/2019 19:56        Scheduled Meds: . diclofenac  75 mg Oral BID  . insulin aspart  0-5 Units Subcutaneous QHS  . insulin aspart  0-9 Units Subcutaneous TID WC  . polyethylene glycol  17 g  Oral Daily  . senna-docusate  1 tablet Oral BID   Continuous Infusions: . sodium chloride    . metronidazole       LOS: 0 days    Time spent: 35 minutes    Shelsie Tijerino Darleen Crocker, DO [facility]  If 7PM-7AM, please contact night-coverage [email or web address] 11/01/2019, 4:32 PM

## 2019-11-01 NOTE — Plan of Care (Signed)

## 2019-11-02 LAB — HEMOGLOBIN A1C
Hgb A1c MFr Bld: 10.7 % — ABNORMAL HIGH (ref 4.8–5.6)
Mean Plasma Glucose: 260 mg/dL

## 2019-11-02 NOTE — Discharge Summary (Signed)
Physician Discharge Summary  Duane [name] ZYS:[id number] DOB: 11-20-1960 DOA: 10/31/2019  PCP: Duane Coss, NP  Admit date: 10/31/2019  Discharge date: 11/02/2019  Admitted From:Home  Disposition:  Patient Elopement   Brief/Interim Summary: Per HPI: Duane [name] a 59 y.o.malewith medical history significant forhypertension, arthritis, chronic neck and back pain (follows with pain clinic at [facility]) and internal hemorrhoids who presents to the emergency department due to lower abdominal pain which started several hours prior toarrival tothe ED. Patient states that he has not had any bowel movement in 4 days and that this afternoon, he endorsed extreme straining while trying to have a bowel movement, this was associated with excessive sweating and palpitations, patient states that he thought he was having a heart attack, so he activated EMS. He denies chest pain, shortness of breath, headache, blurry vision, nausea or vomiting.  ED Course: In the emergency department, he was tachycardic and tachypneic, but other vital signs were within normal range. Work-up in the ED showed leukocytosis, hemoglobin 17.6, hematocrit 50.9, creatinine 1.58 (creatinine was 1.63-month ago), total bilirubin 1.7. Influenza Aand B negative, SARS coronavirus 2was negative. CT abdomen and pelvis with contrast showed redundant large bowel with retained stool throughout. Increased stool ball in the rectum since 2018. Query constipation, fecal impaction. Abdominal x-ray showed nonobstructive bowel gas pattern, no radiopaque calculi are visualized. Fecal disimpaction was done in the ED with a moderate amount of hard stool balls from rectum and with blood noted on stool.Stool occult was heme positive and no anal fissure was noted per ED physician's note. General surgery (Dr. Constance Haw) was consulted and will see patient in the morning perED physician.  10/21: Patient was admitted with abdominal pain  presumably secondary to constipation in the setting of chronic opioid use.  He unfortunately has significant leukocytosis as well as elevated procalcitonin levels as well as some hyperglycemia with no prior history of type 2 diabetes.  He has been seen by general surgery with recommendation to obtain ultrasound of the right upper quadrant for evaluation of cholecystitis.  This will be obtained at this time with IV antibiotics initiated for empiric treatment.  10/22: Patient has had ultrasound of his abdomen with no findings of acute cholecystitis noted.  He has refused lab work this a.m. and was noted by nursing staff to be running down the hallway, trying to escape the hospital.  Security guards started chasing this patient out to [facility] and were able to remove his IV line.  According to nursing staff, he was competent to make his own medical decisions and was otherwise alert and oriented x3.  He is a retired Engineer, structural and was surrounded by Agricultural engineer who have gathered around him on the street.  Discharge Diagnoses:  Principal Problem:   Abdominal pain Active Problems:   Chronic neck and back pain   Essential hypertension   Hyperglycemia   Constipation   Leukocytosis   Dehydration   Hepatic steatosis   SIRS (systemic inflammatory response syndrome) (HCC)   Internal hemorrhoids   Acute kidney injury superimposed on CKD (HCC)   Serum total bilirubin elevated   Allergies  Allergen Reactions  . Codeine   . Escitalopram Other (See Comments)    Patient states it caused night terrors  . Hydrocodone Nausea And Vomiting  . Oxycodone Nausea And Vomiting  . Penicillins Nausea Only    .Has patient had a PCN reaction causing immediate rash, facial/tongue/throat swelling, SOB or lightheadedness with hypotension: No Has patient had a  PCN reaction causing severe rash involving mucus membranes or skin necrosis: No  Has patient had a PCN reaction that required hospitalization: No Has  patient had a PCN reaction occurring within the last 10 years: No If all of the above answers are "NO", then may proceed with Cephalosporin use.   Marland Kitchen Penicillins   . Trazodone Other (See Comments)    Increased BP and HR per patient  . Levofloxacin Rash    Consultations:  General surgery   Procedures/Studies: DG Abdomen 1 View  Result Date: 10/31/2019 CLINICAL DATA:  Abdomen pain EXAM: ABDOMEN - 1 VIEW COMPARISON:  None. FINDINGS: Nonobstructed bowel-gas pattern. No radiopaque calculi are visualized. IMPRESSION: Negative. Electronically Signed   By: Donavan Foil M.D.   On: 10/31/2019 18:52   CT ABDOMEN PELVIS W CONTRAST  Result Date: 10/31/2019 CLINICAL DATA:  59 year old male with sudden onset severe abdominal pain 1430 hours. EXAM: CT ABDOMEN AND PELVIS WITH CONTRAST TECHNIQUE: Multidetector CT imaging of the abdomen and pelvis was performed using the standard protocol following bolus administration of intravenous contrast. CONTRAST:  69mL OMNIPAQUE IOHEXOL 300 MG/ML  SOLN COMPARISON:  KUB at 1842 hours today. CT Abdomen and Pelvis 08/26/2016 FINDINGS: Lower chest: Resolved right lower lobe infection seen in 2018. Minor residual atelectasis or scarring at the right lung base. Otherwise negative lower chest. No pericardial or pleural effusion. Hepatobiliary: Mild interposition of redundant hepatic flexure now between portion of the right hepatic lobe and lateral diaphragm. Evidence of chronic hepatic steatosis. But otherwise negative liver and gallbladder. No bile duct enlargement. Pancreas: Diminutive, negative. Spleen: Negative. Adrenals/Urinary Tract: Normal adrenal glands. Bilateral renal enhancement is normal. There is a small benign appearing left renal midpole cyst (series 7, image 22). Only early contrast excretion to the renal pyramids on the delayed images, but no hydronephrosis, hydroureter, or perinephric stranding. Decompressed and negative ureters. No urinary calculus.  Unremarkable urinary bladder. Stomach/Bowel: Redundant large bowel with retained stool throughout. Increased stool ball in the rectum compared to 2018 (series 2, image 91). Primarily liquid stool in the redundant right and transverse colons. No large bowel inflammation. Normal appendix on series 2, image 70. Negative terminal ileum. No dilated or abnormal small bowel. Stomach moderately distended with fluid but otherwise negative. Decompressed and negative duodenum. No free air, free fluid, mesenteric stranding. Vascular/Lymphatic: Major arterial structures are patent and appear normal. Portal venous system is patent. No lymphadenopathy. Reproductive: Negative. Other: No pelvic free fluid. Musculoskeletal: Negative. IMPRESSION: 1. Redundant large bowel with retained stool throughout. Increased stool ball in the rectum since 2018. Query constipation, fecal impaction. 2. Otherwise no acute or inflammatory process identified in the abdomen or pelvis. Normal appendix. 3. Chronic hepatic steatosis. Electronically Signed   By: Genevie Ann M.D.   On: 10/31/2019 19:56   US Abdomen Limited RUQ (LIVER/GB)  Result Date: 11/01/2019 CLINICAL DATA:  Acute cholecystitis EXAM: ULTRASOUND ABDOMEN LIMITED RIGHT UPPER QUADRANT COMPARISON:  CT 10/31/2019 FINDINGS: Gallbladder: No gallstones or wall thickening visualized. No sonographic Murphy sign noted by sonographer. Common bile duct: Diameter: Normal caliber, 4 mm Liver: Increased echotexture compatible with fatty infiltration. No focal abnormality or biliary ductal dilatation. Portal vein is patent on color Doppler imaging with normal direction of blood flow towards the liver. Other: None. IMPRESSION: Fatty infiltration of the liver. No evidence of cholelithiasis or acute cholecystitis. Electronically Signed   By: Rolm Baptise M.D.   On: 11/01/2019 17:12      Discharge Exam: Vitals:   11/01/19 2215 11/02/19  0554  BP: (!) 145/98 (!) 144/89  Pulse: 81 82  Resp:  16  Temp:   98.1 F (36.7 C)  SpO2: 98% 96%   Vitals:   11/01/19 1429 11/01/19 2158 11/01/19 2215 11/02/19 0554  BP: 123/80 (!) 170/101 (!) 145/98 (!) 144/89  Pulse: 95 80 81 82  Resp: 18 16  16   Temp: 98.4 F (36.9 C) 98.2 F (36.8 C)  98.1 F (36.7 C)  TempSrc: Oral Oral  Oral  SpO2: 96% 99% 98% 96%  Weight:      Height:         The results of significant diagnostics from this hospitalization (including imaging, microbiology, ancillary and laboratory) are listed below for reference.     Microbiology: Recent Results (from the past 240 hour(s))  Respiratory Panel by RT PCR (Flu A&B, Covid) - Nasopharyngeal Swab     Status: None   Collection Time: 10/31/19  8:05 PM   Specimen: Nasopharyngeal Swab  Result Value Ref Range Status   SARS Coronavirus 2 by RT PCR NEGATIVE NEGATIVE Final    Comment: (NOTE) SARS-CoV-2 target nucleic acids are NOT DETECTED.  The SARS-CoV-2 RNA is generally detectable in upper respiratoy specimens during the acute phase of infection. The lowest concentration of SARS-CoV-2 viral copies this assay can detect is 131 copies/mL. A negative result does not preclude SARS-Cov-2 infection and should not be used as the sole basis for treatment or other patient management decisions. A negative result may occur with  improper specimen collection/handling, submission of specimen other than nasopharyngeal swab, presence of viral mutation(s) within the areas targeted by this assay, and inadequate number of viral copies (<131 copies/mL). A negative result must be combined with clinical observations, patient history, and epidemiological information. The expected result is Negative.  Fact Sheet for Patients:  PinkCheek.be  Fact Sheet for Healthcare Providers:  GravelBags.it  This test is no t yet approved or cleared by the [place] FDA and  has been authorized for detection and/or diagnosis of SARS-CoV-2  by FDA under an Emergency Use Authorization (EUA). This EUA will remain  in effect (meaning this test can be used) for the duration of the COVID-19 declaration under Section 564(b)(1) of the Act, 21 U.S.C. section 360bbb-3(b)(1), unless the authorization is terminated or revoked sooner.     Influenza A by PCR NEGATIVE NEGATIVE Final   Influenza B by PCR NEGATIVE NEGATIVE Final    Comment: (NOTE) The Xpert Xpress SARS-CoV-2/FLU/RSV assay is intended as an aid in  the diagnosis of influenza from Nasopharyngeal swab specimens and  should not be used as a sole basis for treatment. Nasal washings and  aspirates are unacceptable for Xpert Xpress SARS-CoV-2/FLU/RSV  testing.  Fact Sheet for Patients: PinkCheek.be  Fact Sheet for Healthcare Providers: GravelBags.it  This test is not yet approved or cleared by the [place] FDA and  has been authorized for detection and/or diagnosis of SARS-CoV-2 by  FDA under an Emergency Use Authorization (EUA). This EUA will remain  in effect (meaning this test can be used) for the duration of the  Covid-19 declaration under Section 564(b)(1) of the Act, 21  U.S.C. section 360bbb-3(b)(1), unless the authorization is  terminated or revoked. Performed at [facility], [address] [place], [place] [address]      Labs: BNP (last 3 results) No results for input(s): BNP in the last 8760 hours. Basic Metabolic Panel: Recent Labs  Lab 10/31/19 1838 11/01/19 0558 11/01/19 1402  NA 137 137 135  K 4.1 4.9 3.8  CL 101 105 108  CO2 16* 18* 20*  GLUCOSE 296* 335* 219*  BUN 20 22* 19  CREATININE 1.58* 1.43* 1.12  CALCIUM 9.5 8.2* 7.7*  MG  --  1.8  --   PHOS  --  1.7*  --    Liver Function Tests: Recent Labs  Lab 10/31/19 1838 11/01/19 0558  AST 36 25  ALT 28 22  ALKPHOS 103 58  BILITOT 1.7* 1.5*  PROT 8.0 6.3*  ALBUMIN 4.4 3.3*   Recent Labs  Lab 10/31/19 1838  LIPASE 43    No results for input(s): AMMONIA in the last 168 hours. CBC: Recent Labs  Lab 10/31/19 1838 11/01/19 0558 11/01/19 1402  WBC 24.1* 21.6*  --   NEUTROABS 21.3*  --   --   HGB 17.6* 14.1 13.0  HCT 50.9 41.3 38.3*  MCV 89.0 88.6  --   PLT 348 281  --    Cardiac Enzymes: No results for input(s): CKTOTAL, CKMB, CKMBINDEX, TROPONINI in the last 168 hours. BNP: Invalid input(s): POCBNP CBG: Recent Labs  Lab 11/01/19 1332 11/01/19 1703 11/01/19 2017  GLUCAP 219* 187* 177*   D-Dimer No results for input(s): DDIMER in the last 72 hours. Hgb A1c Recent Labs    11/01/19 0558  HGBA1C 10.7*   Lipid Profile No results for input(s): CHOL, HDL, LDLCALC, TRIG, CHOLHDL, LDLDIRECT in the last 72 hours. Thyroid function studies No results for input(s): TSH, T4TOTAL, T3FREE, THYROIDAB in the last 72 hours.  Invalid input(s): FREET3 Anemia work up Recent Labs    11/01/19 1402  VITAMINB12 685  FOLATE 5.8*  FERRITIN 201  TIBC 247*  IRON 18*  RETICCTPCT 1.4   Urinalysis    Component Value Date/Time   COLORURINE YELLOW 11/01/2019 1400   APPEARANCEUR CLEAR 11/01/2019 1400   LABSPEC 1.011 11/01/2019 1400   PHURINE 5.0 11/01/2019 1400   GLUCOSEU >=500 (A) 11/01/2019 1400   HGBUR SMALL (A) 11/01/2019 1400   BILIRUBINUR NEGATIVE 11/01/2019 1400   KETONESUR NEGATIVE 11/01/2019 1400   PROTEINUR NEGATIVE 11/01/2019 1400   UROBILINOGEN 0.2 03/28/2008 1002   NITRITE NEGATIVE 11/01/2019 1400   LEUKOCYTESUR NEGATIVE 11/01/2019 1400   Sepsis Labs Invalid input(s): PROCALCITONIN,  WBC,  LACTICIDVEN Microbiology Recent Results (from the past 240 hour(s))  Respiratory Panel by RT PCR (Flu A&B, Covid) - Nasopharyngeal Swab     Status: None   Collection Time: 10/31/19  8:05 PM   Specimen: Nasopharyngeal Swab  Result Value Ref Range Status   SARS Coronavirus 2 by RT PCR NEGATIVE NEGATIVE Final    Comment: (NOTE) SARS-CoV-2 target nucleic acids are NOT DETECTED.  The SARS-CoV-2  RNA is generally detectable in upper respiratoy specimens during the acute phase of infection. The lowest concentration of SARS-CoV-2 viral copies this assay can detect is 131 copies/mL. A negative result does not preclude SARS-Cov-2 infection and should not be used as the sole basis for treatment or other patient management decisions. A negative result may occur with  improper specimen collection/handling, submission of specimen other than nasopharyngeal swab, presence of viral mutation(s) within the areas targeted by this assay, and inadequate number of viral copies (<131 copies/mL). A negative result must be combined with clinical observations, patient history, and epidemiological information. The expected result is Negative.  Fact Sheet for Patients:  PinkCheek.be  Fact Sheet for Healthcare Providers:  GravelBags.it  This test is no t yet approved or cleared by the [place] FDA and  has been  authorized for detection and/or diagnosis of SARS-CoV-2 by FDA under an Emergency Use Authorization (EUA). This EUA will remain  in effect (meaning this test can be used) for the duration of the COVID-19 declaration under Section 564(b)(1) of the Act, 21 U.S.C. section 360bbb-3(b)(1), unless the authorization is terminated or revoked sooner.     Influenza A by PCR NEGATIVE NEGATIVE Final   Influenza B by PCR NEGATIVE NEGATIVE Final    Comment: (NOTE) The Xpert Xpress SARS-CoV-2/FLU/RSV assay is intended as an aid in  the diagnosis of influenza from Nasopharyngeal swab specimens and  should not be used as a sole basis for treatment. Nasal washings and  aspirates are unacceptable for Xpert Xpress SARS-CoV-2/FLU/RSV  testing.  Fact Sheet for Patients: PinkCheek.be  Fact Sheet for Healthcare Providers: GravelBags.it  This test is not yet approved or cleared by the  [place] FDA and  has been authorized for detection and/or diagnosis of SARS-CoV-2 by  FDA under an Emergency Use Authorization (EUA). This EUA will remain  in effect (meaning this test can be used) for the duration of the  Covid-19 declaration under Section 564(b)(1) of the Act, 21  U.S.C. section 360bbb-3(b)(1), unless the authorization is  terminated or revoked. Performed at [facility], [address], [place], [place] [address]      Time coordinating discharge: 35 minutes  SIGNED:   Rodena Goldmann, DO [facility] 11/02/2019, 8:19 AM  If 7PM-7AM, please contact night-coverage [email or web address]

## 2019-11-02 NOTE — Progress Notes (Signed)
Pt refused labs this morning and he removed heart monitor and refused to let RN put monitor back in place. MD notified.

## 2019-11-02 NOTE — Progress Notes (Signed)
Patient left room and staff saw him run to the stairwell. They called security and security found him outside the ED entrance walking/running down [employer]. Staff said that he had on a gown and no shoes and was holding car keys in his hand. Security was able to remove his IVs, but patient stated that he was not returning to the hospital. On Thursday, 11/01/2019, patient was alert and oriented for my entire shift. I text-paged Dr. Manuella Ghazi to let him know that patient had left the hospital.

## 2019-11-07 ENCOUNTER — Encounter: Payer: Self-pay | Admitting: Internal Medicine

## 2019-12-17 ENCOUNTER — Ambulatory Visit: Payer: Medicare Other | Admitting: Gastroenterology

## 2020-04-26 ENCOUNTER — Other Ambulatory Visit: Payer: Self-pay | Admitting: Registered Nurse

## 2020-04-26 DIAGNOSIS — G894 Chronic pain syndrome: Secondary | ICD-10-CM

## 2020-06-02 ENCOUNTER — Other Ambulatory Visit: Payer: Self-pay | Admitting: Registered Nurse

## 2020-06-02 DIAGNOSIS — I10 Essential (primary) hypertension: Secondary | ICD-10-CM

## 2020-07-02 ENCOUNTER — Other Ambulatory Visit: Payer: Self-pay | Admitting: Registered Nurse

## 2020-07-02 DIAGNOSIS — I10 Essential (primary) hypertension: Secondary | ICD-10-CM

## 2020-10-14 ENCOUNTER — Ambulatory Visit (INDEPENDENT_AMBULATORY_CARE_PROVIDER_SITE_OTHER): Payer: Medicare Other | Admitting: Registered Nurse

## 2020-10-14 ENCOUNTER — Encounter: Payer: Self-pay | Admitting: Registered Nurse

## 2020-10-14 ENCOUNTER — Other Ambulatory Visit: Payer: Self-pay

## 2020-10-14 VITALS — BP 118/82 | HR 74 | Temp 98.2°F | Resp 18 | Ht 69.0 in | Wt 151.4 lb

## 2020-10-14 DIAGNOSIS — M26629 Arthralgia of temporomandibular joint, unspecified side: Secondary | ICD-10-CM | POA: Diagnosis not present

## 2020-10-14 DIAGNOSIS — R110 Nausea: Secondary | ICD-10-CM

## 2020-10-14 DIAGNOSIS — I10 Essential (primary) hypertension: Secondary | ICD-10-CM | POA: Diagnosis not present

## 2020-10-14 DIAGNOSIS — Z8739 Personal history of other diseases of the musculoskeletal system and connective tissue: Secondary | ICD-10-CM | POA: Diagnosis not present

## 2020-10-14 DIAGNOSIS — G8929 Other chronic pain: Secondary | ICD-10-CM

## 2020-10-14 LAB — COMPREHENSIVE METABOLIC PANEL
ALT: 19 U/L (ref 0–53)
AST: 19 U/L (ref 0–37)
Albumin: 4.7 g/dL (ref 3.5–5.2)
Alkaline Phosphatase: 95 U/L (ref 39–117)
BUN: 10 mg/dL (ref 6–23)
CO2: 29 mEq/L (ref 19–32)
Calcium: 9.5 mg/dL (ref 8.4–10.5)
Chloride: 94 mEq/L — ABNORMAL LOW (ref 96–112)
Creatinine, Ser: 1.04 mg/dL (ref 0.40–1.50)
GFR: 79.25 mL/min (ref >60.00)
Glucose, Bld: 296 mg/dL — ABNORMAL HIGH (ref 70–99)
Potassium: 3.9 mEq/L (ref 3.5–5.1)
Sodium: 135 mEq/L (ref 135–145)
Total Bilirubin: 1 mg/dL (ref 0.2–1.2)
Total Protein: 7.9 g/dL (ref 6.0–8.3)

## 2020-10-14 LAB — LIPID PANEL
Cholesterol: 183 mg/dL (ref 0–200)
HDL: 48.5 mg/dL (ref >39.00)
LDL Cholesterol: 99 mg/dL (ref 0–99)
NonHDL: 134.54
Total CHOL/HDL Ratio: 4
Triglycerides: 178 mg/dL — ABNORMAL HIGH (ref 0.0–149.0)
VLDL: 35.6 mg/dL (ref 0.0–40.0)

## 2020-10-14 LAB — CBC WITH DIFFERENTIAL/PLATELET
Basophils Absolute: 0 10*3/uL (ref 0.0–0.1)
Basophils Relative: 0.6 % (ref 0.0–3.0)
Eosinophils Absolute: 0.1 10*3/uL (ref 0.0–0.7)
Eosinophils Relative: 0.9 % (ref 0.0–5.0)
HCT: 45.7 % (ref 39.0–52.0)
Hemoglobin: 15.6 g/dL (ref 13.0–17.0)
Lymphocytes Relative: 30.5 % (ref 12.0–46.0)
Lymphs Abs: 1.9 10*3/uL (ref 0.7–4.0)
MCHC: 34.1 g/dL (ref 30.0–36.0)
MCV: 89.2 fl (ref 78.0–100.0)
Monocytes Absolute: 0.4 10*3/uL (ref 0.1–1.0)
Monocytes Relative: 6.7 % (ref 3.0–12.0)
Neutro Abs: 3.9 10*3/uL (ref 1.4–7.7)
Neutrophils Relative %: 61.3 % (ref 43.0–77.0)
Platelets: 290 10*3/uL (ref 150.0–400.0)
RBC: 5.12 Mil/uL (ref 4.22–5.81)
RDW: 13.2 % (ref 11.5–15.5)
WBC: 6.3 10*3/uL (ref 4.0–10.5)

## 2020-10-14 MED ORDER — ENALAPRIL MALEATE 20 MG PO TABS: ORAL_TABLET | ORAL | 3 refills | Status: DC

## 2020-10-14 MED ORDER — PROMETHAZINE HCL 25 MG PO TABS: 12.5000 mg | ORAL_TABLET | Freq: Four times a day (QID) | ORAL | 3 refills | Status: DC | PRN

## 2020-10-14 MED ORDER — ONDANSETRON 8 MG PO TBDP: 8.0000 mg | ORAL_TABLET | Freq: Three times a day (TID) | ORAL | 0 refills | Status: DC | PRN

## 2020-10-14 MED ORDER — INDOMETHACIN 50 MG PO CAPS: 50.0000 mg | ORAL_CAPSULE | Freq: Three times a day (TID) | ORAL | 3 refills | Status: DC | PRN

## 2020-10-14 NOTE — Patient Instructions (Signed)
Mr. Sudduth -  Always a pleasure  Meds refilled x 1 year  I'll be in touch with lab results  Let me know if you need anything   Rich

## 2020-10-14 NOTE — Progress Notes (Signed)
Established Patient Office Visit  Subjective:  Patient ID: Duane [name], male    DOB: 13-Feb-1960  Age: 60 y.o. MRN: [id number]  CC:  Chief Complaint  Patient presents with   Follow-up    Med recheck    HPI TREVIAN HAYASHIDA presents for follow up   Med check   Hypertension: Patient Currently taking: enalapril 20mg  po qd Good effect. No AEs. Denies CV symptoms including: chest pain, shob, doe, headache, visual changes, fatigue, claudication, and dependent edema.   Previous readings and labs: BP Readings from Last 3 Encounters:  10/14/20 118/82  11/02/19 (!) 144/89  10/16/19 125/86   Lab Results  Component Value Date   CREATININE 1.12 11/01/2019    Gout  Using indomethacin Prn Good effect, no AE Managed by pain management - on hydromorphone 4mg  po qid prn as well for chronic pain  Nausea with vomiting Mostly at night - not every night, maybe once a week. Has used promethazine in the past with limited effect Also been on zofran, limited effect Has tried pepcid, no relief.  Jaw pain Tmj pain Has mouthguard from dentist, wearing regularly for over 1 year Interested in taking further steps.  Past Medical History:  Diagnosis Date   Arthritis    Gout    Hypertension     Past Surgical History:  Procedure Laterality Date   ELBOW SURGERY     FOOT FASCIOTOMY     X 5   HEMORRHOID SURGERY     X2, Dr. Arnoldo Morale   NECK EXPLORATION     SHOULDER ARTHROTOMY     multiple     Family History  Problem Relation Age of Onset   Diabetes Mother    COPD Father    Colon cancer Neg Hx     Social History   Socioeconomic History   Marital status: Unknown    Spouse name: Not on file   Number of children: 0   Years of education: Not on file   Highest education level: Not on file  Occupational History   Occupation: Engineer, structural    Employer: [employer]  Tobacco Use   Smoking status: Never   Smokeless tobacco: Never  Vaping Use   Vaping Use: Never used   Substance and Sexual Activity   Alcohol use: Never   Drug use: Never   Sexual activity: Not Currently  Other Topics Concern   Not on file  Social History Narrative   ** Merged History Encounter **       Social Determinants of Health   Financial Resource Strain: Not on file  Food Insecurity: Not on file  Transportation Needs: Not on file  Physical Activity: Not on file  Stress: Not on file  Social Connections: Not on file  Intimate Partner Violence: Not on file    Outpatient Medications Prior to Visit  Medication Sig Dispense Refill   acetaminophen (TYLENOL) 650 MG CR tablet Take 1,300 mg by mouth every 8 (eight) hours as needed for pain.     HYDROmorphone (DILAUDID) 4 MG tablet Take 4 mg by mouth every 4 (four) hours as needed.     enalapril (VASOTEC) 20 MG tablet TAKE (1) TABLET BY MOUTH ONCE DAILY. 90 tablet 0   promethazine (PHENERGAN) 25 MG tablet Take 0.5 tablets (12.5 mg total) by mouth every 6 (six) hours as needed. 90 tablet 3   diclofenac (VOLTAREN) 75 MG EC tablet TAKE (1) TABLET BY MOUTH TWICE DAILY. (Patient not taking: Reported on  10/14/2020) 90 tablet 0   indomethacin (INDOCIN) 50 MG capsule Take 1 capsule (50 mg total) by mouth 3 (three) times daily as needed. (Patient not taking: No sig reported) 60 capsule 3   oxyCODONE (ROXICODONE) 15 MG immediate release tablet Take 15 mg by mouth every 4 (four) hours as needed for pain. (Patient not taking: No sig reported)     QUEtiapine (SEROQUEL) 50 MG tablet Take 1 tablet (50 mg total) by mouth at bedtime. (Patient not taking: No sig reported) 90 tablet 1   zolpidem (AMBIEN) 10 MG tablet Take 1 tablet (10 mg total) by mouth at bedtime as needed for sleep. (Patient not taking: Reported on 10/31/2019) 30 tablet 2   No facility-administered medications prior to visit.    Allergies  Allergen Reactions   Codeine    Escitalopram Other (See Comments)    Patient states it caused night terrors   Hydrocodone Nausea And Vomiting    Oxycodone Nausea And Vomiting   Penicillins Nausea Only    .Has patient had a PCN reaction causing immediate rash, facial/tongue/throat swelling, SOB or lightheadedness with hypotension: No Has patient had a PCN reaction causing severe rash involving mucus membranes or skin necrosis: No  Has patient had a PCN reaction that required hospitalization: No Has patient had a PCN reaction occurring within the last 10 years: No If all of the above answers are "NO", then may proceed with Cephalosporin use.    Penicillins    Trazodone Other (See Comments)    Increased BP and HR per patient   Levofloxacin Rash    ROS Review of Systems  Constitutional: Negative.   HENT: Negative.    Eyes: Negative.   Respiratory: Negative.    Cardiovascular: Negative.   Gastrointestinal: Negative.   Genitourinary: Negative.   Musculoskeletal: Negative.   Skin: Negative.   Neurological: Negative.   Psychiatric/Behavioral: Negative.    All other systems reviewed and are negative.    Objective:    Physical Exam Constitutional:      General: He is not in acute distress.    Appearance: Normal appearance. He is normal weight. He is not ill-appearing, toxic-appearing or diaphoretic.  Cardiovascular:     Rate and Rhythm: Normal rate and regular rhythm.     Heart sounds: Normal heart sounds. No murmur heard.   No friction rub. No gallop.  Pulmonary:     Effort: Pulmonary effort is normal. No respiratory distress.     Breath sounds: Normal breath sounds. No stridor. No wheezing, rhonchi or rales.  Chest:     Chest wall: No tenderness.  Neurological:     General: No focal deficit present.     Mental Status: He is alert and oriented to person, place, and time. Mental status is at baseline.  Psychiatric:        Mood and Affect: Mood normal.        Behavior: Behavior normal.        Thought Content: Thought content normal.        Judgment: Judgment normal.    BP 118/82   Pulse 74   Temp 98.2 F (36.8  C) (Temporal)   Resp 18   Ht 5\' 9"  (1.753 m)   Wt 151 lb 6.4 oz (68.7 kg)   SpO2 98%   BMI 22.36 kg/m  Wt Readings from Last 3 Encounters:  10/14/20 151 lb 6.4 oz (68.7 kg)  10/31/19 155 lb (70.3 kg)  10/16/19 157 lb 6.4 oz (71.4 kg)  Health Maintenance Due  Topic Date Due   TETANUS/TDAP  Never done   COLONOSCOPY (Pts 45-43yrs Insurance coverage will need to be confirmed)  Never done   Zoster Vaccines- Shingrix (1 of 2) Never done    There are no preventive care reminders to display for this patient.  Lab Results  Component Value Date   TSH 1.350 08/24/2016   Lab Results  Component Value Date   WBC 21.6 (H) 11/01/2019   HGB 13.0 11/01/2019   HCT 38.3 (L) 11/01/2019   MCV 88.6 11/01/2019   PLT 281 11/01/2019   Lab Results  Component Value Date   NA 135 11/01/2019   K 3.8 11/01/2019   CO2 20 (L) 11/01/2019   GLUCOSE 219 (H) 11/01/2019   BUN 19 11/01/2019   CREATININE 1.12 11/01/2019   BILITOT 1.5 (H) 11/01/2019   ALKPHOS 58 11/01/2019   AST 25 11/01/2019   ALT 22 11/01/2019   PROT 6.3 (L) 11/01/2019   ALBUMIN 3.3 (L) 11/01/2019   CALCIUM 7.7 (L) 11/01/2019   ANIONGAP 7 11/01/2019   Lab Results  Component Value Date   CHOL 159 11/29/2018   Lab Results  Component Value Date   HDL 44 11/29/2018   Lab Results  Component Value Date   LDLCALC 87 11/29/2018   Lab Results  Component Value Date   TRIG 161 (H) 11/29/2018   Lab Results  Component Value Date   CHOLHDL 3.6 11/29/2018   Lab Results  Component Value Date   HGBA1C 10.7 (H) 11/01/2019      Assessment & Plan:   Problem List Items Addressed This Visit       Cardiovascular and Mediastinum   Essential hypertension - Primary   Relevant Medications   enalapril (VASOTEC) 20 MG tablet   Other Relevant Orders   CBC with Differential/Platelet   Comprehensive metabolic panel   Lipid panel     Other   Nausea without vomiting   Relevant Medications   promethazine (PHENERGAN) 25 MG  tablet   ondansetron (ZOFRAN-ODT) 8 MG disintegrating tablet   Other Visit Diagnoses     History of gout       Relevant Medications   indomethacin (INDOCIN) 50 MG capsule   Chronic TMJ pain       Relevant Medications   indomethacin (INDOCIN) 50 MG capsule   Other Relevant Orders   Ambulatory referral to Physical Therapy       Meds ordered this encounter  Medications   enalapril (VASOTEC) 20 MG tablet    Sig: TAKE (1) TABLET BY MOUTH ONCE DAILY.    Dispense:  90 tablet    Refill:  3   promethazine (PHENERGAN) 25 MG tablet    Sig: Take 0.5 tablets (12.5 mg total) by mouth every 6 (six) hours as needed.    Dispense:  90 tablet    Refill:  3   indomethacin (INDOCIN) 50 MG capsule    Sig: Take 1 capsule (50 mg total) by mouth 3 (three) times daily as needed.    Dispense:  60 capsule    Refill:  3   ondansetron (ZOFRAN-ODT) 8 MG disintegrating tablet    Sig: Take 1 tablet (8 mg total) by mouth every 8 (eight) hours as needed for nausea or vomiting.    Dispense:  20 tablet    Refill:  0    Order Specific Question:   Supervising Provider    Answer:   Carlota Raspberry, JEFFREY R [[id number]]    Follow-up: Return  in about 1 year (around 10/14/2021) for Med check .   PLAN Refill meds x 1 year Labs collected. Will follow up with the patient as warranted. Refer to PT for TMJ pain Zofran for nausea as above Return annually Patient encouraged to call clinic with any questions, comments, or concerns.  Maximiano Coss, NP

## 2020-11-14 ENCOUNTER — Other Ambulatory Visit: Payer: Self-pay | Admitting: Registered Nurse

## 2020-12-29 ENCOUNTER — Encounter: Payer: Self-pay | Admitting: Registered Nurse

## 2020-12-29 ENCOUNTER — Ambulatory Visit (INDEPENDENT_AMBULATORY_CARE_PROVIDER_SITE_OTHER): Payer: Medicare Other | Admitting: Registered Nurse

## 2020-12-29 VITALS — BP 144/89 | HR 77 | Temp 98.3°F | Resp 18 | Ht 69.0 in | Wt 148.6 lb

## 2020-12-29 DIAGNOSIS — M542 Cervicalgia: Secondary | ICD-10-CM | POA: Diagnosis not present

## 2020-12-29 DIAGNOSIS — G894 Chronic pain syndrome: Secondary | ICD-10-CM

## 2020-12-29 DIAGNOSIS — Z981 Arthrodesis status: Secondary | ICD-10-CM

## 2020-12-29 MED ORDER — TIZANIDINE HCL 4 MG PO TABS: 4.0000 mg | ORAL_TABLET | Freq: Four times a day (QID) | ORAL | 0 refills | Status: DC | PRN
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
## 2021-01-01 ENCOUNTER — Telehealth: Payer: Self-pay
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
## 2021-01-06 ENCOUNTER — Encounter: Payer: Self-pay | Admitting: Registered Nurse

## 2021-01-06 NOTE — Telephone Encounter (Signed)
Done  Thanks  Denice Paradise

## 2021-01-16 ENCOUNTER — Telehealth: Payer: Self-pay | Admitting: Orthopedic Surgery

## 2021-01-16 NOTE — Telephone Encounter (Signed)
Voice message received from patient /call returned; patient requests appointment with Dr Aline Brochure. Message left for patient to return call.

## 2021-01-19 ENCOUNTER — Ambulatory Visit (INDEPENDENT_AMBULATORY_CARE_PROVIDER_SITE_OTHER): Payer: Medicare Other | Admitting: Registered Nurse

## 2021-01-19 DIAGNOSIS — R110 Nausea: Secondary | ICD-10-CM

## 2021-01-19 DIAGNOSIS — Z8739 Personal history of other diseases of the musculoskeletal system and connective tissue: Secondary | ICD-10-CM | POA: Diagnosis not present

## 2021-01-19 DIAGNOSIS — G959 Disease of spinal cord, unspecified: Secondary | ICD-10-CM

## 2021-01-19 DIAGNOSIS — I10 Essential (primary) hypertension: Secondary | ICD-10-CM | POA: Diagnosis not present

## 2021-01-19 DIAGNOSIS — M25532 Pain in left wrist: Secondary | ICD-10-CM

## 2021-01-19 DIAGNOSIS — M542 Cervicalgia: Secondary | ICD-10-CM | POA: Diagnosis not present

## 2021-01-19 MED ORDER — PROMETHAZINE HCL 25 MG PO TABS: 12.5000 mg | ORAL_TABLET | Freq: Four times a day (QID) | ORAL | 3 refills | Status: DC | PRN

## 2021-01-19 MED ORDER — ONDANSETRON 8 MG PO TBDP: 8.0000 mg | ORAL_TABLET | Freq: Three times a day (TID) | ORAL | 0 refills | Status: AC | PRN

## 2021-01-19 MED ORDER — TIZANIDINE HCL 4 MG PO TABS: 4.0000 mg | ORAL_TABLET | Freq: Four times a day (QID) | ORAL | 0 refills | Status: DC | PRN

## 2021-01-19 MED ORDER — INDOMETHACIN 50 MG PO CAPS: 50.0000 mg | ORAL_CAPSULE | Freq: Three times a day (TID) | ORAL | 3 refills | Status: DC | PRN

## 2021-01-19 MED ORDER — ENALAPRIL MALEATE 20 MG PO TABS: ORAL_TABLET | ORAL | 3 refills | Status: AC

## 2021-01-19 MED ORDER — DICLOFENAC SODIUM 75 MG PO TBEC
75.0000 mg | DELAYED_RELEASE_TABLET | Freq: Two times a day (BID) | ORAL | 0 refills | Status: DC
Start: 2021-01-19 — End: 2021-05-04
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
## 2021-01-19 NOTE — Patient Instructions (Signed)
Mr. Weniger -   Odette Horns of wrist at [facility]. They take walk ins for xray.  MRI c-spine - we will help get this scheduled.  Meds refilled  I recommend staying out of work until consult with neurosurgeon and imaging is completed.  Thank you  Rich

## 2021-01-30 ENCOUNTER — Ambulatory Visit
Admission: RE | Admit: 2021-01-30 | Discharge: 2021-01-30 | Disposition: A | Discharge status: Home / Self Care | Payer: Medicare Other | Source: Ambulatory Visit | Attending: Registered Nurse | Admitting: Registered Nurse

## 2021-01-30 ENCOUNTER — Other Ambulatory Visit: Payer: Self-pay

## 2021-01-30 DIAGNOSIS — G959 Disease of spinal cord, unspecified: Secondary | ICD-10-CM

## 2021-01-30 DIAGNOSIS — M542 Cervicalgia: Secondary | ICD-10-CM

## 2021-01-30 MED ORDER — GADOBENATE DIMEGLUMINE 529 MG/ML IV SOLN
14.0000 mL | Freq: Once | INTRAVENOUS | Status: AC | PRN
  Administered 2021-01-30: 14 mL via INTRAVENOUS

## 2021-02-03 ENCOUNTER — Ambulatory Visit (INDEPENDENT_AMBULATORY_CARE_PROVIDER_SITE_OTHER): Payer: Medicare Other | Admitting: Registered Nurse

## 2021-02-03 VITALS — BP 151/87 | HR 83 | Temp 98.3°F | Resp 18 | Ht 69.0 in | Wt 145.8 lb

## 2021-02-03 DIAGNOSIS — I10 Essential (primary) hypertension: Secondary | ICD-10-CM

## 2021-02-03 MED ORDER — AMLODIPINE BESYLATE 5 MG PO TABS: 5.0000 mg | ORAL_TABLET | Freq: Every day | ORAL | 1 refills | Status: DC

## 2021-02-03 NOTE — Patient Instructions (Addendum)
CPT codes:  MRI Cervical spine: 71062 DG Wrist: [id number]   Thank you,  Rich   If you have lab work done today you will be contacted with your lab results within the next 2 weeks.  If you have not heard from [place] then please contact us. The fastest way to get your results is to register for My Chart.   IF you received an x-ray today, you will receive an invoice from [facility]. Please contact [facility] at [phone] with questions or concerns regarding your invoice.   IF you received labwork today, you will receive an invoice from [facility]. Please contact [facility] at [phone] with questions or concerns regarding your invoice.   Our billing staff will not be able to assist you with questions regarding bills from these companies.  You will be contacted with the lab results as soon as they are available. The fastest way to get your results is to activate your My Chart account. Instructions are located on the last page of this paperwork. If you have not heard from [place] regarding the results in 2 weeks, please contact this office.

## 2021-02-03 NOTE — Progress Notes (Signed)
Established Patient Office Visit  Subjective:  Patient ID: Duane [name], male    DOB: 1960-12-12  Age: 61 y.o. MRN: [id number]  CC:  Chief Complaint  Patient presents with   Results    Patient states he would like to discuss the results of his MRI.    HPI [name] presents for MRI results  C spine MRI performed on 01/31/21  Has had multiple events at work leading to worsening neck pain and limits in ROM. He has chronic back and neck pain. He does understand that we do not handle workman's comp cases in our office and this care is separate from any case that may be in process.  MRI on 1/21 shows: "ACDF with solid fusion C3 through C6   Bilateral small areas of focal cord hyperintensity at the C4 level not seen in 2009. These are most compatible with chronic myelomalacia. Mild spinal stenosis at this level.   Mild foraminal narrowing bilaterally C6-7 and C7-T1"   Also of note is his htn Hypertension: Patient Currently taking: enalapril 20mg  po qd Good effect. No AEs. Denies CV symptoms including: chest pain, shob, doe, headache, visual changes, fatigue, claudication, and dependent edema.   Previous readings and labs: BP Readings from Last 3 Encounters:  02/03/21 (!) 151/87  01/19/21 (!) 169/89  12/29/20 (!) 144/89   Lab Results  Component Value Date   CREATININE 1.04 10/14/2020     Past Medical History:  Diagnosis Date   Arthritis    Gout    Hypertension     Past Surgical History:  Procedure Laterality Date   ELBOW SURGERY     FOOT FASCIOTOMY     X 5   HEMORRHOID SURGERY     X2, Dr. Arnoldo Morale   NECK EXPLORATION     SHOULDER ARTHROTOMY     multiple     Family History  Problem Relation Age of Onset   Diabetes Mother    COPD Father    Colon cancer Neg Hx     Social History   Socioeconomic History   Marital status: Unknown    Spouse name: Not on file   Number of children: 0   Years of education: Not on file   Highest education level: Not  on file  Occupational History   Occupation: Engineer, structural    Employer: [employer]  Tobacco Use   Smoking status: Never   Smokeless tobacco: Never  Vaping Use   Vaping Use: Never used  Substance and Sexual Activity   Alcohol use: Never   Drug use: Never   Sexual activity: Not Currently  Other Topics Concern   Not on file  Social History Narrative   ** Merged History Encounter **       Social Determinants of Health   Financial Resource Strain: Not on file  Food Insecurity: Not on file  Transportation Needs: Not on file  Physical Activity: Not on file  Stress: Not on file  Social Connections: Not on file  Intimate Partner Violence: Not on file    Outpatient Medications Prior to Visit  Medication Sig Dispense Refill   acetaminophen (TYLENOL) 650 MG CR tablet Take 1,300 mg by mouth every 8 (eight) hours as needed for pain.     diclofenac (VOLTAREN) 75 MG EC tablet Take 1 tablet (75 mg total) by mouth 2 (two) times daily. 30 tablet 0   enalapril (VASOTEC) 10 MG tablet TAKE (1) TABLET BY MOUTH ONCE DAILY. 90 tablet  3   enalapril (VASOTEC) 20 MG tablet TAKE (1) TABLET BY MOUTH ONCE DAILY. 90 tablet 3   HYDROmorphone (DILAUDID) 4 MG tablet Take 4 mg by mouth every 4 (four) hours as needed.     indomethacin (INDOCIN) 50 MG capsule Take 1 capsule (50 mg total) by mouth 3 (three) times daily as needed. 60 capsule 3   ondansetron (ZOFRAN-ODT) 8 MG disintegrating tablet Take 1 tablet (8 mg total) by mouth every 8 (eight) hours as needed for nausea or vomiting. 20 tablet 0   promethazine (PHENERGAN) 25 MG tablet Take 0.5 tablets (12.5 mg total) by mouth every 6 (six) hours as needed. 90 tablet 3   tiZANidine (ZANAFLEX) 4 MG tablet Take 1 tablet (4 mg total) by mouth every 6 (six) hours as needed for muscle spasms. 30 tablet 0   No facility-administered medications prior to visit.    Allergies  Allergen Reactions   Codeine    Escitalopram Other (See Comments)    Patient  states it caused night terrors   Hydrocodone Nausea And Vomiting   Oxycodone Nausea And Vomiting   Penicillins Nausea Only    .Has patient had a PCN reaction causing immediate rash, facial/tongue/throat swelling, SOB or lightheadedness with hypotension: No Has patient had a PCN reaction causing severe rash involving mucus membranes or skin necrosis: No  Has patient had a PCN reaction that required hospitalization: No Has patient had a PCN reaction occurring within the last 10 years: No If all of the above answers are "NO", then may proceed with Cephalosporin use.    Penicillins    Trazodone Other (See Comments)    Increased BP and HR per patient   Levofloxacin Rash    ROS Review of Systems  Constitutional: Negative.   HENT: Negative.    Eyes: Negative.   Respiratory: Negative.    Cardiovascular: Negative.   Gastrointestinal: Negative.   Genitourinary: Negative.   Musculoskeletal:  Positive for arthralgias, back pain, myalgias, neck pain and neck stiffness.  Skin: Negative.   Neurological: Negative.   Psychiatric/Behavioral: Negative.    All other systems reviewed and are negative.    Objective:    Physical Exam Constitutional:      General: He is not in acute distress.    Appearance: Normal appearance. He is normal weight. He is not ill-appearing, toxic-appearing or diaphoretic.  Cardiovascular:     Rate and Rhythm: Normal rate and regular rhythm.     Heart sounds: Normal heart sounds. No murmur heard.   No friction rub. No gallop.  Pulmonary:     Effort: Pulmonary effort is normal. No respiratory distress.     Breath sounds: Normal breath sounds. No stridor. No wheezing, rhonchi or rales.  Chest:     Chest wall: No tenderness.  Neurological:     General: No focal deficit present.     Mental Status: He is alert and oriented to person, place, and time. Mental status is at baseline.  Psychiatric:        Mood and Affect: Mood normal.        Behavior: Behavior normal.         Thought Content: Thought content normal.        Judgment: Judgment normal.    BP (!) 151/87    Pulse 83    Temp 98.3 F (36.8 C) (Temporal)    Resp 18    Ht 5\' 9"  (1.753 m)    Wt 145 lb 12.8 oz (66.1 kg)  BMI 21.53 kg/m  Wt Readings from Last 3 Encounters:  02/03/21 145 lb 12.8 oz (66.1 kg)  01/19/21 159 lb 3.2 oz (72.2 kg)  12/29/20 148 lb 9.6 oz (67.4 kg)     Health Maintenance Due  Topic Date Due   COVID-19 Vaccine (1) Never done   TETANUS/TDAP  Never done   COLONOSCOPY (Pts 45-55yrs Insurance coverage will need to be confirmed)  Never done   Zoster Vaccines- Shingrix (1 of 2) Never done    There are no preventive care reminders to display for this patient.  Lab Results  Component Value Date   TSH 1.350 08/24/2016   Lab Results  Component Value Date   WBC 6.3 10/14/2020   HGB 15.6 10/14/2020   HCT 45.7 10/14/2020   MCV 89.2 10/14/2020   PLT 290.0 10/14/2020   Lab Results  Component Value Date   NA 135 10/14/2020   K 3.9 10/14/2020   CO2 29 10/14/2020   GLUCOSE 296 (H) 10/14/2020   BUN 10 10/14/2020   CREATININE 1.04 10/14/2020   BILITOT 1.0 10/14/2020   ALKPHOS 95 10/14/2020   AST 19 10/14/2020   ALT 19 10/14/2020   PROT 7.9 10/14/2020   ALBUMIN 4.7 10/14/2020   CALCIUM 9.5 10/14/2020   ANIONGAP 7 11/01/2019   GFR 79.25 10/14/2020   Lab Results  Component Value Date   CHOL 183 10/14/2020   Lab Results  Component Value Date   HDL 48.50 10/14/2020   Lab Results  Component Value Date   LDLCALC 99 10/14/2020   Lab Results  Component Value Date   TRIG 178.0 (H) 10/14/2020   Lab Results  Component Value Date   CHOLHDL 4 10/14/2020   Lab Results  Component Value Date   HGBA1C 10.7 (H) 11/01/2019      Assessment & Plan:   Problem List Items Addressed This Visit       Cardiovascular and Mediastinum   Essential hypertension - Primary   Relevant Medications   amLODipine (NORVASC) 5 MG tablet    Meds ordered this encounter   Medications   amLODipine (NORVASC) 5 MG tablet    Sig: Take 1 tablet (5 mg total) by mouth daily.    Dispense:  90 tablet    Refill:  1    Order Specific Question:   Supervising Provider    Answer:   Carlota Raspberry, JEFFREY R [[id number]]    Follow-up: No follow-ups on file.   PLAN Add amlodipine 5mg  po qd for uncontrolled htn. Return in 2 weeks for nurse visit bp check. Return in 3 mo for check w provider. Pt expresses understanding that we do not handle workmans comp cases and that any work we do / procedures we order are separate from any case. Discussed findings on MRI with patient and suggest follow up with neurosurg as discussed Patient encouraged to call clinic with any questions, comments, or concerns.  Maximiano Coss, NP

## 2021-02-10 ENCOUNTER — Telehealth: Payer: Self-pay

## 2021-02-10 NOTE — Telephone Encounter (Signed)
Patient form is in folder to be signed.

## 2021-02-10 NOTE — Telephone Encounter (Signed)
Caller name:[name]   On DPR? :No  Call back [phone]  Provider they see: Richard   Reason for call:Pt dropped off forms to be completed

## 2021-02-16 NOTE — Telephone Encounter (Signed)
Took a look at the form - unsure where I need to sign on this - looks like it's already done  Thanks,  [employer]
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
## 2021-02-25 ENCOUNTER — Encounter: Payer: Self-pay | Admitting: Registered Nurse

## 2021-02-25 NOTE — Telephone Encounter (Signed)
Printed  Thanks,  Writer

## 2021-02-26 NOTE — Telephone Encounter (Signed)
Called patient to let him know that his letter was placed up front for pick up.

## 2021-03-03 ENCOUNTER — Telehealth: Payer: Self-pay | Admitting: Registered Nurse

## 2021-03-03 NOTE — Telephone Encounter (Signed)
Left message for patient to call back and schedule Medicare Annual Wellness Visit (AWV) in office.   If not able to come in office, please offer to do virtually or by telephone.  Left office number and my jabber [phone].  Last AWV: 11/12/2011   Please schedule at anytime with Nurse Health Advisor.

## 2021-03-06 ENCOUNTER — Telehealth (INDEPENDENT_AMBULATORY_CARE_PROVIDER_SITE_OTHER): Payer: Medicare Other | Admitting: Registered Nurse

## 2021-03-06 ENCOUNTER — Other Ambulatory Visit: Payer: Self-pay

## 2021-03-06 ENCOUNTER — Encounter: Payer: Self-pay | Admitting: Registered Nurse

## 2021-03-06 DIAGNOSIS — U071 COVID-19: Secondary | ICD-10-CM

## 2021-03-06 MED ORDER — NIRMATRELVIR/RITONAVIR (PAXLOVID)TABLET: 3.0000 | ORAL_TABLET | Freq: Two times a day (BID) | ORAL | 0 refills | Status: AC

## 2021-03-06 MED ORDER — DM-GUAIFENESIN ER 30-600 MG PO TB12: 1.0000 | ORAL_TABLET | Freq: Two times a day (BID) | ORAL | 0 refills | Status: AC

## 2021-03-06 MED ORDER — PREDNISONE 10 MG (21) PO TBPK: ORAL_TABLET | ORAL | 0 refills | Status: DC

## 2021-03-06 MED ORDER — PROMETHAZINE HCL 6.25 MG/5ML PO SYRP: 6.2500 mg | ORAL_SOLUTION | Freq: Four times a day (QID) | ORAL | 0 refills | Status: AC | PRN

## 2021-03-06 NOTE — Patient Instructions (Signed)
° ° ° °  If you have lab work done today you will be contacted with your lab results within the next 2 weeks.  If you have not heard from us then please contact us. The fastest way to get your results is to register for My Chart. ° ° °IF you received an x-ray today, you will receive an invoice from [facility]. Please contact [facility] at [phone] with questions or concerns regarding your invoice.  ° °IF you received labwork today, you will receive an invoice from [facility]. Please contact [facility] at [phone] with questions or concerns regarding your invoice.  ° °Our billing staff will not be able to assist you with questions regarding bills from these companies. ° °You will be contacted with the lab results as soon as they are available. The fastest way to get your results is to activate your My Chart account. Instructions are located on the last page of this paperwork. If you have not heard from us regarding the results in 2 weeks, please contact this office. °  ° ° ° °

## 2021-03-09 NOTE — Progress Notes (Signed)
Telemedicine Encounter- SOAP NOTE Established Patient  This telephone encounter was conducted with the patient's (or proxy's) verbal consent via audio telecommunications: yes/no: Yes Patient was instructed to have this encounter in a suitably private space; and to only have persons present to whom they give permission to participate. In addition, patient identity was confirmed by use of name plus two identifiers (DOB and address).  I discussed the limitations, risks, security and privacy concerns of performing an evaluation and management service by telephone and the availability of in person appointments. I also discussed with the patient that there may be a patient responsible charge related to this service. The patient expressed understanding and agreed to proceed.  I spent a total of 21 minutes talking with the patient or their proxy.  Patient at home Provider in office  Participants: Kathrin Ruddy, NP and Norma Fredrickson  Chief Complaint  Patient presents with   Covid Positive    Patient states he tested positive for covid today. He has been having a sore throat , fever, and body aches. Patient states he was taking OTC medications     Subjective   Duane [name] is a 61 y.o. established patient. Telephone visit today for COVID+  HPI Symptoms onset [facility] Tested positive for COVID today with home test Sore throat turned into fever, body aches over past 2 days Taking OTC analgesics and cough / cold relief. Limited effect Now has hoarse voice No shob, doe, chest pain, headaches, visual changes, nvd  Patient Active Problem List   Diagnosis Date Noted   Abdominal pain 10/31/2019   Constipation 10/31/2019   Leukocytosis 10/31/2019   Dehydration 10/31/2019   Hepatic steatosis 10/31/2019   SIRS (systemic inflammatory response syndrome) ([place]) 10/31/2019   Internal hemorrhoids 10/31/2019   Acute kidney injury superimposed on CKD ([place]) 10/31/2019   Serum total bilirubin elevated  10/31/2019   Patellofemoral pain syndrome of right knee 02/20/2018   History of elbow surgery 10/18/2016   Cubital tunnel syndrome 09/28/2016   Hospital discharge follow-up 09/01/2016   Cough 09/01/2016   Pneumonia due to Mycoplasma pneumoniae 09/01/2016   Lobar pneumonia ([place]) 08/27/2016   Fever, unknown origin 08/26/2016   Sepsis due to undetermined organism ([place]) 08/25/2016   Gram negative sepsis ([place]) 08/25/2016   Bacteremia due to Gram-negative bacteria 08/25/2016   Syncope 08/25/2016   Syncope and collapse 08/24/2016   Sepsis due to pneumonia ([place]) 08/24/2016   Hyperglycemia 08/24/2016   Nausea without vomiting 07/02/2016   Erectile dysfunction 07/02/2016   SLAP lesion of shoulder 08/04/2012   Muscle weakness (generalized) 08/04/2012   Chronic neck and back pain 08/31/2011   Deformity, acquired 08/31/2011   Essential hypertension 08/31/2011   Insomnia 08/31/2011   Epigastric discomfort 05/03/2011   LUMBAR SPRAIN AND STRAIN 10/22/2009   CUBITAL TUNNEL SYNDROME 10/24/2007   SHOULDER PAIN 10/24/2007   OTHER ENTHESOPATHY OF KNEE 07/31/2007   ELBOW PAIN 05/25/2007    Past Medical History:  Diagnosis Date   Arthritis    Gout    Hypertension     Current Outpatient Medications  Medication Sig Dispense Refill   acetaminophen (TYLENOL) 650 MG CR tablet Take 1,300 mg by mouth every 8 (eight) hours as needed for pain.     amLODipine (NORVASC) 5 MG tablet Take 1 tablet (5 mg total) by mouth daily. 90 tablet 1   dextromethorphan-guaiFENesin (MUCINEX DM) 30-600 MG 12hr tablet Take 1 tablet by mouth 2 (two) times daily. 20 tablet 0   diclofenac (  VOLTAREN) 75 MG EC tablet Take 1 tablet (75 mg total) by mouth 2 (two) times daily. 30 tablet 0   enalapril (VASOTEC) 10 MG tablet TAKE (1) TABLET BY MOUTH ONCE DAILY. 90 tablet 3   enalapril (VASOTEC) 20 MG tablet TAKE (1) TABLET BY MOUTH ONCE DAILY. 90 tablet 3   HYDROmorphone (DILAUDID) 4 MG tablet Take 4 mg by mouth every 4 (four)  hours as needed.     indomethacin (INDOCIN) 50 MG capsule Take 1 capsule (50 mg total) by mouth 3 (three) times daily as needed. 60 capsule 3   nirmatrelvir/ritonavir EUA (PAXLOVID) 20 x 150 MG & 10 x 100MG  TABS Take 3 tablets by mouth 2 (two) times daily for 5 days. (Take nirmatrelvir 150 mg two tablets twice daily for 5 days and ritonavir 100 mg one tablet twice daily for 5 days) Patient GFR is >60 30 tablet 0   ondansetron (ZOFRAN-ODT) 8 MG disintegrating tablet Take 1 tablet (8 mg total) by mouth every 8 (eight) hours as needed for nausea or vomiting. 20 tablet 0   predniSONE (STERAPRED UNI-PAK 21 TAB) 10 MG (21) TBPK tablet Take per package instructions. Do not skip doses. Finish entire supply. 1 each 0   promethazine (PHENERGAN) 6.25 MG/5ML syrup Take 5 mLs (6.25 mg total) by mouth every 6 (six) hours as needed for nausea or vomiting. 120 mL 0   tiZANidine (ZANAFLEX) 4 MG tablet Take 1 tablet (4 mg total) by mouth every 6 (six) hours as needed for muscle spasms. 30 tablet 0   No current facility-administered medications for this visit.    Allergies  Allergen Reactions   Codeine    Escitalopram Other (See Comments)    Patient states it caused night terrors   Hydrocodone Nausea And Vomiting   Oxycodone Nausea And Vomiting   Penicillins Nausea Only    .Has patient had a PCN reaction causing immediate rash, facial/tongue/throat swelling, SOB or lightheadedness with hypotension: No Has patient had a PCN reaction causing severe rash involving mucus membranes or skin necrosis: No  Has patient had a PCN reaction that required hospitalization: No Has patient had a PCN reaction occurring within the last 10 years: No If all of the above answers are "NO", then may proceed with Cephalosporin use.    Penicillins    Trazodone Other (See Comments)    Increased BP and HR per patient   Levofloxacin Rash    Social History   Socioeconomic History   Marital status: Unknown    Spouse name: Not on  file   Number of children: 0   Years of education: Not on file   Highest education level: Not on file  Occupational History   Occupation: Engineer, structural    Employer: [employer]  Tobacco Use   Smoking status: Never   Smokeless tobacco: Never  Vaping Use   Vaping Use: Never used  Substance and Sexual Activity   Alcohol use: Never   Drug use: Never   Sexual activity: Not Currently  Other Topics Concern   Not on file  Social History Narrative   ** Merged History Encounter **       Social Determinants of Health   Financial Resource Strain: Not on file  Food Insecurity: Not on file  Transportation Needs: Not on file  Physical Activity: Not on file  Stress: Not on file  Social Connections: Not on file  Intimate Partner Violence: Not on file    ROS Per hpi  Objective   Vitals as reported by the patient: There were no vitals filed for this visit.  Duane [name] was seen today for covid positive.  Diagnoses and all orders for this visit:  COVID-19 -     nirmatrelvir/ritonavir EUA (PAXLOVID) 20 x 150 MG & 10 x 100MG  TABS; Take 3 tablets by mouth 2 (two) times daily for 5 days. (Take nirmatrelvir 150 mg two tablets twice daily for 5 days and ritonavir 100 mg one tablet twice daily for 5 days) Patient GFR is >60 -     promethazine (PHENERGAN) 6.25 MG/5ML syrup; Take 5 mLs (6.25 mg total) by mouth every 6 (six) hours as needed for nausea or vomiting. -     dextromethorphan-guaiFENesin (MUCINEX DM) 30-600 MG 12hr tablet; Take 1 tablet by mouth 2 (two) times daily. -     predniSONE (STERAPRED UNI-PAK 21 TAB) 10 MG (21) TBPK tablet; Take per package instructions. Do not skip doses. Finish entire supply.   PLAN Discussed treatment options. Opt for paxlovid. Discussed risks, benefits, Ae, alternatives. Pt voices undersatnding. Promethazine, mucinex, and prednisone for symptom relief ER precautions outlined. Patient encouraged to call clinic with any questions, comments, or  concerns.   I discussed the assessment and treatment plan with the patient. The patient was provided an opportunity to ask questions and all were answered. The patient agreed with the plan and demonstrated an understanding of the instructions.   The patient was advised to call back or seek an in-person evaluation if the symptoms worsen or if the condition fails to improve as anticipated.  I provided 21 minutes of non-face-to-face time during this encounter.  Maximiano Coss, NP

## 2021-03-17 ENCOUNTER — Telehealth: Payer: Self-pay | Admitting: Registered Nurse

## 2021-03-17 ENCOUNTER — Other Ambulatory Visit: Payer: Self-pay | Admitting: Registered Nurse

## 2021-03-17 DIAGNOSIS — M542 Cervicalgia: Secondary | ICD-10-CM

## 2021-03-17 NOTE — Telephone Encounter (Signed)
No visit needed. Does he need mid back or low back? ? ?Thanks, ? ?Rich

## 2021-03-17 NOTE — Telephone Encounter (Signed)
Called LM to ask what portion of back  ?
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
## 2021-03-17 NOTE — Telephone Encounter (Signed)
Needs an associated visit? ? ?

## 2021-03-18 ENCOUNTER — Other Ambulatory Visit: Payer: Self-pay | Admitting: Registered Nurse

## 2021-03-18 DIAGNOSIS — G8929 Other chronic pain: Secondary | ICD-10-CM

## 2021-03-18 DIAGNOSIS — M6281 Muscle weakness (generalized): Secondary | ICD-10-CM

## 2021-03-18 DIAGNOSIS — M542 Cervicalgia: Secondary | ICD-10-CM

## 2021-03-18 NOTE — Telephone Encounter (Signed)
Order placed ? ?Thanks, ? ?Duane [name]

## 2021-03-18 NOTE — Telephone Encounter (Signed)
Pt states that he needs low back  ?

## 2021-05-04 ENCOUNTER — Encounter: Payer: Self-pay | Admitting: Orthopedic Surgery

## 2021-05-04 ENCOUNTER — Ambulatory Visit: Payer: Medicare Other

## 2021-05-04 ENCOUNTER — Ambulatory Visit (INDEPENDENT_AMBULATORY_CARE_PROVIDER_SITE_OTHER): Payer: Medicare Other | Admitting: Orthopedic Surgery

## 2021-05-04 ENCOUNTER — Other Ambulatory Visit: Payer: Self-pay | Admitting: Registered Nurse

## 2021-05-04 VITALS — BP 139/93 | HR 94 | Ht 69.0 in | Wt 146.2 lb

## 2021-05-04 DIAGNOSIS — M542 Cervicalgia: Secondary | ICD-10-CM

## 2021-05-04 DIAGNOSIS — M25552 Pain in left hip: Secondary | ICD-10-CM

## 2021-05-04 DIAGNOSIS — M545 Low back pain, unspecified: Secondary | ICD-10-CM | POA: Diagnosis not present

## 2021-05-04 NOTE — Patient Instructions (Signed)
You've been referred to physical therapy. You have asked to do this in [place]. They will call to schedule that appt ?Follow up within 1-2 weeks for left wrist pain w/ xray wrist/scaphoid ?
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
## 2021-05-14 ENCOUNTER — Ambulatory Visit (INDEPENDENT_AMBULATORY_CARE_PROVIDER_SITE_OTHER): Payer: Medicare Other | Admitting: Orthopedic Surgery

## 2021-05-14 ENCOUNTER — Encounter: Payer: Self-pay | Admitting: Orthopedic Surgery

## 2021-05-14 ENCOUNTER — Ambulatory Visit (INDEPENDENT_AMBULATORY_CARE_PROVIDER_SITE_OTHER): Payer: Medicare Other

## 2021-05-14 DIAGNOSIS — M549 Dorsalgia, unspecified: Secondary | ICD-10-CM

## 2021-05-14 DIAGNOSIS — M25532 Pain in left wrist: Secondary | ICD-10-CM | POA: Diagnosis not present

## 2021-05-14 DIAGNOSIS — M542 Cervicalgia: Secondary | ICD-10-CM | POA: Diagnosis not present

## 2021-05-14 DIAGNOSIS — G8929 Other chronic pain: Secondary | ICD-10-CM

## 2021-05-14 NOTE — Patient Instructions (Signed)
Follow up June 8,2023 ?
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
## 2021-05-21 ENCOUNTER — Ambulatory Visit (HOSPITAL_COMMUNITY): Payer: Medicare Other | Attending: Orthopedic Surgery | Admitting: Physical Therapy

## 2021-05-21 ENCOUNTER — Encounter (HOSPITAL_COMMUNITY): Payer: Self-pay | Admitting: Physical Therapy

## 2021-05-21 DIAGNOSIS — M545 Low back pain, unspecified: Secondary | ICD-10-CM | POA: Insufficient documentation

## 2021-05-21 DIAGNOSIS — M25552 Pain in left hip: Secondary | ICD-10-CM

## 2021-05-21 DIAGNOSIS — M5459 Other low back pain: Secondary | ICD-10-CM
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
## 2021-05-22 ENCOUNTER — Ambulatory Visit (HOSPITAL_COMMUNITY): Payer: Medicare Other | Admitting: Physical Therapy

## 2021-05-26 ENCOUNTER — Ambulatory Visit (HOSPITAL_COMMUNITY): Payer: Medicare Other | Admitting: Physical Therapy

## 2021-05-26 DIAGNOSIS — M5459 Other low back pain: Secondary | ICD-10-CM | POA: Diagnosis not present

## 2021-05-26 DIAGNOSIS — M25552 Pain in left hip: Secondary | ICD-10-CM

## 2021-05-26 NOTE — Therapy (Signed)
?OUTPATIENT PHYSICAL THERAPY THORACOLUMBAR EVALUATION ? ? ?Patient Name: Duane [name] ?MRN: [id number] ?DOB:Dec 21, 1960, 61 y.o., male ?Today's Date: 05/26/2021 ? ? PT End of Session - 05/26/21 1049   ? ? Visit Number 2   ? Number of Visits 12   ? Date for PT Re-Evaluation 07/02/21   ? Authorization Type Medicare A   ? Progress Note Due on Visit 10   ? PT Start Time 1045   ? PT Stop Time 1130   ? PT Time Calculation (min) 45 min   ? Activity Tolerance Patient tolerated treatment well   ? Behavior During Therapy [facility] for tasks assessed/performed   ? ?  ?  ? ?  ? ? ?Past Medical History:  ?Diagnosis Date  ? Arthritis   ? Gout   ? Hypertension   ? ?Past Surgical History:  ?Procedure Laterality Date  ? ELBOW SURGERY    ? FOOT FASCIOTOMY    ? X 5  ? HEMORRHOID SURGERY    ? X2, Dr. Arnoldo Morale  ? NECK EXPLORATION    ? SHOULDER ARTHROTOMY    ? multiple   ? ?Patient Active Problem List  ? Diagnosis Date Noted  ? Abdominal pain 10/31/2019  ? Constipation 10/31/2019  ? Leukocytosis 10/31/2019  ? Dehydration 10/31/2019  ? Hepatic steatosis 10/31/2019  ? SIRS (systemic inflammatory response syndrome) ([place]) 10/31/2019  ? Internal hemorrhoids 10/31/2019  ? Acute kidney injury superimposed on CKD ([place]) 10/31/2019  ? Serum total bilirubin elevated 10/31/2019  ? Patellofemoral pain syndrome of right knee 02/20/2018  ? History of elbow surgery 10/18/2016  ? Cubital tunnel syndrome 09/28/2016  ? Hospital discharge follow-up 09/01/2016  ? Cough 09/01/2016  ? Pneumonia due to Mycoplasma pneumoniae 09/01/2016  ? Lobar pneumonia ([place]) 08/27/2016  ? Fever, unknown origin 08/26/2016  ? Sepsis due to undetermined organism ([place]) 08/25/2016  ? Gram negative sepsis ([place]) 08/25/2016  ? Bacteremia due to Gram-negative bacteria 08/25/2016  ? Syncope 08/25/2016  ? Syncope and collapse 08/24/2016  ? Sepsis due to pneumonia ([employer]) 08/24/2016  ? Hyperglycemia 08/24/2016  ? Nausea without vomiting 07/02/2016  ? Erectile dysfunction 07/02/2016  ? SLAP lesion of  shoulder 08/04/2012  ? Muscle weakness (generalized) 08/04/2012  ? Chronic neck and back pain 08/31/2011  ? Deformity, acquired 08/31/2011  ? Essential hypertension 08/31/2011  ? Insomnia 08/31/2011  ? Epigastric discomfort 05/03/2011  ? LUMBAR SPRAIN AND STRAIN 10/22/2009  ? CUBITAL TUNNEL SYNDROME 10/24/2007  ? SHOULDER PAIN 10/24/2007  ? OTHER ENTHESOPATHY OF KNEE 07/31/2007  ? ELBOW PAIN 05/25/2007  ? ? ?PCP: Maximiano Coss NP ? ?REFERRING PROVIDER: Carole Civil, MD  ? ?REFERRING DIAG: Diagnosis M54.50 (ICD-10-CM) - Lumbar pain M25.552 (ICD-10-CM) - Pain in left hip  ? ?THERAPY DIAG:  ?Other low back pain ? ?Pain in left hip ? ?ONSET DATE: December/ January 2023 ? ?SUBJECTIVE:                                                                                                                                                                                          ? ?  SUBJECTIVE STATEMENT: ?Pt states that after he completed the exercises he had electric like pain going down into his lt leg.  ?PERTINENT HISTORY:  ?NA ? ?PAIN:  ?Are you having pain? Yes: NPRS scale: 0/10 ?Pain location: low back and LT hip  ?Pain description: dull aching  ?Aggravating factors: sitting/ standing long periods  ?Relieving factors: heat/ ice ? ? ? ?PATIENT GOALS Be healthier, get stronger, return to work ? ? ?OBJECTIVE:  ? 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 ?Impression mild spinal alignment asymmetry coronal and sagittal plane seems to be due to muscle spasm mild chronic degenerative changes anterior lips of the vertebrae  ? ? ?PATIENT SURVEYS: foto  ?50 ?COGNITION: ? Overall cognitive status: Within functional limits for tasks assessed   ?  ?SENSATION: ?WFL ? ?PALPATION: ?Mod TTP about LT lumbar paraspinals, LT greater trochanter  ? ?LUMBAR ROM:  ? ?Active  A/PROM  ?05/26/2021  ?Flexion WFL  ?Extension 100 % limited  ?Right lateral flexion WFL  ?Left  lateral flexion WFL  ?Right rotation WFL  ?Left rotation WFL   ? (Blank rows = not tested) ? ?LE ROM: AROM WFL ? ? ?LE MMT: ? ?MMT Right ?05/26/2021 Left ?05/26/2021  ?Hip flexion 5 5 (slight pain)  ?Hip extension 4+ 4 (pain)  ?Hip abduction 5 4+(pain)  ?Hip adduction    ?Hip internal rotation    ?Hip external rotation    ?Knee flexion    ?Knee extension 5 5  ?Ankle dorsiflexion 5 5  ?Ankle plantarflexion    ?Ankle inversion    ?Ankle eversion    ? (Blank rows = not tested) ? ?LUMBAR SPECIAL TESTS:  ?(+) sacral compression ? ? ? ?TODAY'S TREATMENT  ?                        05/26/2021 ?                        Standing: extension x 10; SB RT and LT x 5 ?                        Supine:   ?                        Noted posterior rotation of LT SI jt ?                        Pelvic cleared with isometric hip ab/adduction x 5 2 reps ?                        Bridge x 5 x 2 reps  ?                        Manual:  contract relax technique for SI correction.  With good correction. ?                        Marching x 10  ?                        Modified SLR start with knees bent slowly straighten leg and return.  ?  Prone:  ?                        POE x 1 minute ?                        Heel squeezes x 10  ? ?05/21/21 ?Ab brace with march ?Ab brace with SLR ?Bridge  ? ? ? ?PATIENT EDUCATION:  ?Education details: on evaluation findings, POC and HEP  ?Person educated: Patient ?Education method: Explanation ?Education comprehension: verbalized understanding and returned demonstration ? ? ?HOME EXERCISE PROGRAM: ?05/21/21 ?Ab brace with march ?Ab brace with SLR ?Bridge  ? ?ASSESSMENT: ? ?CLINICAL IMPRESSION:  Reviewed stabilization exercises given at evaluation.  Pt was not stabilizing core prior to exercising.  Did not add any new exercises had pt continue with exercises given at evaluation but making sure that core is engaged to see if this stops radicular sx.  ?OBJECTIVE IMPAIRMENTS decreased activity  tolerance, decreased mobility, decreased ROM, decreased strength, increased fascial restrictions, increased muscle spasms, improper body mechanics, and pain.  ? ?ACTIVITY LIMITATIONS cleaning, community activity, driving, meal prep, occupation, laundry, yard work, shopping, and yard work.  ? ?PERSONAL FACTORS  None  are also affecting patient's functional outcome.  ? ? ?REHAB POTENTIAL: Good ? ?CLINICAL DECISION MAKING: Stable/uncomplicated ? ?EVALUATION COMPLEXITY: Low ? ? ?GOALS: ?SHORT TERM GOALS: Target date: 06/11/2021 ? ?Patient will be independent with initial HEP and self-management strategies to improve functional outcomes ?Baseline:  ?Goal status: on-going  ? ?LONG TERM GOALS: Target date: 07/02/2021 ? ?Patient will be independent with advanced HEP and self-management strategies to improve functional outcomes ?Baseline:  ?Goal status: on-going  ? ?2.  Patient will improve FOTO score to predicted value to indicate improvement in functional outcomes ?Baseline: (complete next visit) ?Goal status: on-going ? ?3.  Patient will report reduction of back pain to <3/10 for improved quality of life and ability to perform ADLs  ?Baseline: 5-6/10 ?Goal status: on-going  ? ?4. Patient will have equal to 5/5 MMT throughout BLE to improve ability to perform functional mobility, stair ambulation, work tasks, and ADLs.  ?Baseline: See MMT  ?Goal status: On-going  ? ? ?PLAN: ?PT FREQUENCY: 2x/week ? ?PT DURATION: 6 weeks ? ?PLANNED INTERVENTIONS: Therapeutic exercises, Therapeutic activity, Neuromuscular re-education, Balance training, Gait training, Patient/Family education, Joint manipulation, Joint mobilization, Stair training, Aquatic Therapy, Dry Needling, Electrical stimulation, Spinal manipulation, Spinal mobilization, Cryotherapy, Moist heat, scar mobilization, Taping, Traction, Ultrasound, Biofeedback, Ionotophoresis '4mg'$ /ml Dexamethasone, and Manual therapy. . ? ?PLAN FOR NEXT SESSION:  Check SI jt to see if contract  relax is needed for proper positioning.  See if activating core with exercises stopped radicular sx.  Continue to with Core and glute strength as tolerated. Improve lumbar pain free extension ROM.  ? ?Derek Jack

## 2021-05-28 ENCOUNTER — Encounter (HOSPITAL_COMMUNITY): Payer: Medicare Other

## 2021-06-02 ENCOUNTER — Ambulatory Visit (HOSPITAL_COMMUNITY): Payer: Medicare Other | Admitting: Physical Therapy

## 2021-06-02 DIAGNOSIS — M5459 Other low back pain: Secondary | ICD-10-CM

## 2021-06-02 DIAGNOSIS — M25552 Pain in left hip: Secondary | ICD-10-CM

## 2021-06-02 NOTE — Therapy (Signed)
OUTPATIENT PHYSICAL THERAPY TREAMTMENT   Patient Name: Duane [name] MRN: [id number] DOB:12-29-1960, 61 y.o., male Today's Date: 06/02/2021   PT End of Session - 06/02/21 1129     Visit Number 3    Number of Visits 12    Date for PT Re-Evaluation 07/02/21    Authorization Type Medicare A    Progress Note Due on Visit 10    PT Start Time 1050    PT Stop Time 1135    PT Time Calculation (min) 45 min    Activity Tolerance Patient tolerated treatment well    Behavior During Therapy WFL for tasks assessed/performed              Past Medical History:  Diagnosis Date   Arthritis    Gout    Hypertension    Past Surgical History:  Procedure Laterality Date   ELBOW SURGERY     FOOT FASCIOTOMY     X 5   HEMORRHOID SURGERY     X2, Dr. Arnoldo Morale   NECK EXPLORATION     SHOULDER ARTHROTOMY     multiple    Patient Active Problem List   Diagnosis Date Noted   Abdominal pain 10/31/2019   Constipation 10/31/2019   Leukocytosis 10/31/2019   Dehydration 10/31/2019   Hepatic steatosis 10/31/2019   SIRS (systemic inflammatory response syndrome) ([place]) 10/31/2019   Internal hemorrhoids 10/31/2019   Acute kidney injury superimposed on CKD ([place]) 10/31/2019   Serum total bilirubin elevated 10/31/2019   Patellofemoral pain syndrome of right knee 02/20/2018   History of elbow surgery 10/18/2016   Cubital tunnel syndrome 09/28/2016   Hospital discharge follow-up 09/01/2016   Cough 09/01/2016   Pneumonia due to Mycoplasma pneumoniae 09/01/2016   Lobar pneumonia ([place]) 08/27/2016   Fever, unknown origin 08/26/2016   Sepsis due to undetermined organism ([place]) 08/25/2016   Gram negative sepsis ([place]) 08/25/2016   Bacteremia due to Gram-negative bacteria 08/25/2016   Syncope 08/25/2016   Syncope and collapse 08/24/2016   Sepsis due to pneumonia ([place]) 08/24/2016   Hyperglycemia 08/24/2016   Nausea without vomiting 07/02/2016   Erectile dysfunction 07/02/2016   SLAP lesion of shoulder  08/04/2012   Muscle weakness (generalized) 08/04/2012   Chronic neck and back pain 08/31/2011   Deformity, acquired 08/31/2011   Essential hypertension 08/31/2011   Insomnia 08/31/2011   Epigastric discomfort 05/03/2011   LUMBAR SPRAIN AND STRAIN 10/22/2009   CUBITAL TUNNEL SYNDROME 10/24/2007   SHOULDER PAIN 10/24/2007   OTHER ENTHESOPATHY OF KNEE 07/31/2007   ELBOW PAIN 05/25/2007    PCP: Maximiano Coss NP  REFERRING PROVIDER: Carole Civil, MD   REFERRING DIAG: Diagnosis M54.50 (ICD-10-CM) - Lumbar pain M25.552 (ICD-10-CM) - Pain in left hip   THERAPY DIAG:  Other low back pain  Pain in left hip  ONSET DATE: December/ January 2023  SUBJECTIVE:  SUBJECTIVE STATEMENT: Pt states his pain remains around 5 but the medications help to manage this, never goes away.  States the throbbing at [facility] superior/anterior knee has improved, just feeling a twinge rather than the electric like pain going down into his lt leg.  Engaging core is better with less symptoms completing his HEP.  PERTINENT HISTORY:  NA  PAIN:  Are you having pain? 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   Impression mild spinal alignment asymmetry coronal and sagittal plane seems to be due to muscle spasm mild chronic degenerative changes anterior lips of the vertebrae    PATIENT SURVEYS: foto  50 COGNITION:  Overall cognitive status: Within functional limits for tasks  assessed     SENSATION: WFL  PALPATION: Mod TTP about LT lumbar paraspinals, LT greater trochanter   LUMBAR ROM:   Active  A/PROM  06/02/2021  Flexion WFL  Extension 100 % limited  Right lateral flexion WFL  Left lateral flexion WFL  Right rotation WFL  Left rotation WFL    (Blank rows = not tested)  LE ROM: AROM WFL   LE MMT:  MMT Right 06/02/2021 Left 06/02/2021  Hip flexion 5 5 (slight pain)  Hip extension 4+ 4 (pain)  Hip abduction 5 4+(pain)  Hip adduction    Hip internal rotation    Hip external rotation    Knee flexion    Knee extension 5 5  Ankle dorsiflexion 5 5  Ankle plantarflexion    Ankle inversion    Ankle eversion     (Blank rows = not tested)  LUMBAR SPECIAL TESTS:  (+) sacral compression    TODAY'S TREATMENT                         06/02/2021                       Supine:     Checked SIJ, in good alignment; LL measured at 95cm each side, equal                       Bridge 10 x 2 reps                        SLR with core stab 10X2                       Marching 10X2 with core stab                        Prone:                        POE x 1 minute                       Heel squeezes x 10        05/26/2021                         Standing: extension x 10; SB RT and LT  x 5                         Supine:                           Noted posterior rotation of LT SI jt                         Pelvic cleared with isometric hip ab/adduction x 5 2 reps                         Bridge x 5 x 2 reps                          Manual:  contract relax technique for SI correction.  With good correction.                         Marching x 10                          Modified SLR start with knees bent slowly straighten leg and return.                          Prone:                          POE x 1 minute                         Heel squeezes x 10   05/21/21 Ab brace with march Ab brace with SLR Bridge     PATIENT EDUCATION:  Education details:  on evaluation findings, POC and HEP  Person educated: Patient Education method: Explanation Education comprehension: verbalized understanding and returned demonstration   HOME EXERCISE PROGRAM: 05/21/21 Ab brace with march Ab brace with SLR Bridge   ASSESSMENT:  CLINICAL IMPRESSION:  Checked SIJ alignment, in good alignment today.  Leg length also measured and found to be equal bilaterally.  Continued on with core stabilization with ability to complete SLR in correct form with stabilization.  Much improved ability to stabilize core prior to exercising.  Able to increase to 2 sets of 10 reps today.  Pt will continue to benefit from skilled physical therapy to reduce deficits and improve functional status.    OBJECTIVE IMPAIRMENTS decreased activity tolerance, decreased mobility, decreased ROM, decreased strength, increased fascial restrictions, increased muscle spasms, improper body mechanics, and pain.   ACTIVITY LIMITATIONS cleaning, community activity, driving, meal prep, occupation, laundry, yard work, shopping, and yard work.   PERSONAL FACTORS  None  are also affecting patient's functional outcome.    REHAB POTENTIAL: Good  CLINICAL DECISION MAKING: Stable/uncomplicated  EVALUATION COMPLEXITY: Low   GOALS: SHORT TERM GOALS: Target date: 06/11/2021  Patient will be independent with initial HEP and self-management strategies to improve functional outcomes Baseline:  Goal status: on-going   LONG TERM GOALS: Target date: 07/02/2021  Patient will be independent with advanced HEP and self-management strategies to improve functional outcomes Baseline:  Goal status: on-going   2.  Patient will improve FOTO score to predicted value to indicate improvement in functional outcomes Baseline: (complete next visit) Goal status: on-going  3.  Patient  will report reduction of back pain to <3/10 for improved quality of life and ability to perform ADLs  Baseline: 5-6/10 Goal status:  on-going   4. Patient will have equal to 5/5 MMT throughout BLE to improve ability to perform functional mobility, stair ambulation, work tasks, and ADLs.  Baseline: See MMT  Goal status: On-going    PLAN: PT FREQUENCY: 2x/week  PT DURATION: 6 weeks  PLANNED INTERVENTIONS: Therapeutic exercises, Therapeutic activity, Neuromuscular re-education, Balance training, Gait training, Patient/Family education, Joint manipulation, Joint mobilization, Stair training, Aquatic Therapy, Dry Needling, Electrical stimulation, Spinal manipulation, Spinal mobilization, Cryotherapy, Moist heat, scar mobilization, Taping, Traction, Ultrasound, Biofeedback, Ionotophoresis '4mg'$ /ml Dexamethasone, and Manual therapy. Marland Kitchen  PLAN FOR NEXT SESSION:  Continue to Check SI joint for proper positioning.  Continue to with Core and glute strength as tolerated. Improve lumbar pain free extension ROM.   Teena Irani, PTA/CLT [place] Ph: [phone]

## 2021-06-05 ENCOUNTER — Ambulatory Visit (HOSPITAL_COMMUNITY): Payer: Medicare Other

## 2021-06-10 ENCOUNTER — Ambulatory Visit (HOSPITAL_COMMUNITY): Payer: Medicare Other | Admitting: Physical Therapy

## 2021-06-10 DIAGNOSIS — M5459 Other low back pain: Secondary | ICD-10-CM

## 2021-06-10 DIAGNOSIS — M25552 Pain in left hip: Secondary | ICD-10-CM

## 2021-06-10 NOTE — Therapy (Signed)
OUTPATIENT PHYSICAL THERAPY TREATMENT   Patient Name: Duane [name] MRN: [id number] DOB:Jul 03, 1960, 61 y.o., male Today's Date: 06/10/2021   PT End of Session - 06/10/21 1052     Visit Number 4    Number of Visits 12    Date for PT Re-Evaluation 07/02/21    Authorization Type Medicare A    Progress Note Due on Visit 10    PT Start Time 1050    PT Stop Time 4098    PT Time Calculation (min) 38 min    Activity Tolerance Patient tolerated treatment well    Behavior During Therapy WFL for tasks assessed/performed              Past Medical History:  Diagnosis Date   Arthritis    Gout    Hypertension    Past Surgical History:  Procedure Laterality Date   ELBOW SURGERY     FOOT FASCIOTOMY     X 5   HEMORRHOID SURGERY     X2, Dr. Arnoldo Morale   NECK EXPLORATION     SHOULDER ARTHROTOMY     multiple    Patient Active Problem List   Diagnosis Date Noted   Abdominal pain 10/31/2019   Constipation 10/31/2019   Leukocytosis 10/31/2019   Dehydration 10/31/2019   Hepatic steatosis 10/31/2019   SIRS (systemic inflammatory response syndrome) ([place]) 10/31/2019   Internal hemorrhoids 10/31/2019   Acute kidney injury superimposed on CKD ([place]) 10/31/2019   Serum total bilirubin elevated 10/31/2019   Patellofemoral pain syndrome of right knee 02/20/2018   History of elbow surgery 10/18/2016   Cubital tunnel syndrome 09/28/2016   Hospital discharge follow-up 09/01/2016   Cough 09/01/2016   Pneumonia due to Mycoplasma pneumoniae 09/01/2016   Lobar pneumonia ([place]) 08/27/2016   Fever, unknown origin 08/26/2016   Sepsis due to undetermined organism ([place]) 08/25/2016   Gram negative sepsis ([place]) 08/25/2016   Bacteremia due to Gram-negative bacteria 08/25/2016   Syncope 08/25/2016   Syncope and collapse 08/24/2016   Sepsis due to pneumonia ([place]) 08/24/2016   Hyperglycemia 08/24/2016   Nausea without vomiting 07/02/2016   Erectile dysfunction 07/02/2016   SLAP lesion of shoulder  08/04/2012   Muscle weakness (generalized) 08/04/2012   Chronic neck and back pain 08/31/2011   Deformity, acquired 08/31/2011   Essential hypertension 08/31/2011   Insomnia 08/31/2011   Epigastric discomfort 05/03/2011   LUMBAR SPRAIN AND STRAIN 10/22/2009   CUBITAL TUNNEL SYNDROME 10/24/2007   SHOULDER PAIN 10/24/2007   OTHER ENTHESOPATHY OF KNEE 07/31/2007   ELBOW PAIN 05/25/2007    PCP: Maximiano Coss NP  REFERRING PROVIDER: Carole Civil, MD   REFERRING DIAG: Diagnosis M54.50 (ICD-10-CM) - Lumbar pain M25.552 (ICD-10-CM) - Pain in left hip   THERAPY DIAG:  Other low back pain  Pain in left hip  ONSET DATE: December/ January 2023  SUBJECTIVE:  SUBJECTIVE STATEMENT: Pt states his pain remains constant around 5 but the medications help to manage this, never goes away.  States the throbbing at Lt superior/anterior knee has now gone away indicating centralized pain.  PERTINENT HISTORY:  NA  PAIN:  Are you having pain? 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   Impression mild spinal alignment asymmetry coronal and sagittal plane seems to be due to muscle spasm mild chronic degenerative changes anterior lips of the vertebrae    PATIENT SURVEYS: foto  50 COGNITION:  Overall cognitive status: Within functional limits for tasks assessed     SENSATION: WFL  PALPATION: Mod TTP about LT lumbar paraspinals, LT greater trochanter   LUMBAR ROM:   Active  A/PROM   06/10/2021  Flexion WFL  Extension 100 % limited  Right lateral flexion WFL  Left lateral flexion WFL  Right rotation WFL  Left rotation WFL    (Blank rows = not tested)  LE ROM: AROM WFL   LE MMT:  MMT Right 06/10/2021 Left 06/10/2021  Hip flexion 5 5 (slight pain)  Hip extension 4+ 4 (pain)  Hip abduction 5 4+(pain)  Hip adduction    Hip internal rotation    Hip external rotation    Knee flexion    Knee extension 5 5  Ankle dorsiflexion 5 5  Ankle plantarflexion    Ankle inversion    Ankle eversion     (Blank rows = not tested)  LUMBAR SPECIAL TESTS:  (+) sacral compression    TODAY'S TREATMENT                         06/10/2021 Supine:   Checked SIJ, in good alignment; LL measured at 95cm each side, equal     Bridge 10 x 2 reps             SLR with core stab 10X2            Marching 10X2 with core stab  Prone:             POE x 1 minute            Heel squeezes 2x10 5" holds   Hip extension 2X10  Scapular retractions with UE's on table 10x Sidelying:   Hip Abduction 2X10 each  Lateral trunk raises 2X10 each Long sitting:  Hamstring stretch 3X20" each LE  Piriformis stretch 3X20"  06/02/2021                       Supine:     Checked SIJ, in good alignment; LL measured at 95cm each side, equal                       Bridge 10 x 2 reps                        SLR with core stab 10X2                       Marching 10X2 with core stab  Prone:                        POE x 1 minute                       Heel squeezes x 10        05/26/2021                         Standing: extension x 10; SB RT and LT x 5                         Supine:                           Noted posterior rotation of LT SI jt                         Pelvic cleared with isometric hip ab/adduction x 5 2 reps                         Bridge x 5 x 2 reps                          Manual:  contract relax technique for SI correction.  With good correction.                          Marching x 10                          Modified SLR start with knees bent slowly straighten leg and return.                          Prone:                          POE x 1 minute                         Heel squeezes x 10   05/21/21 Ab brace with march Ab brace with SLR Bridge     PATIENT EDUCATION:  Education details: on evaluation findings, POC and HEP  Person educated: Patient Education method: Explanation Education comprehension: verbalized understanding and returned demonstration   HOME EXERCISE PROGRAM: 05/21/21 Ab brace with march Ab brace with SLR Bridge   ASSESSMENT:  CLINICAL IMPRESSION:   Continued on with core stabilization completing 2 sets of each activity.  Added lateral trunk raise to improve mm strength as well as hip abduction in sidelying.  Little cues needed during session as Pt with overall good form.   with noted weakness/absence of mm in Rt trunk. Attempted rows in prone, however unable to raise Rt UE.  Modified to scap retractions with UE on mat with isometric hold.  Pain is lower this session as compared to last.  Much improved ability to stabilize core prior to exercising.  Returns to MD next week following therapy.  Pt will continue to benefit from skilled physical therapy to reduce deficits and improve functional status.    OBJECTIVE IMPAIRMENTS decreased activity tolerance, decreased mobility, decreased ROM, decreased strength, increased fascial restrictions, increased  muscle spasms, improper body mechanics, and pain.   ACTIVITY LIMITATIONS cleaning, community activity, driving, meal prep, occupation, laundry, yard work, shopping, and yard work.   PERSONAL FACTORS  None  are also affecting patient's functional outcome.    REHAB POTENTIAL: Good  CLINICAL DECISION MAKING: Stable/uncomplicated  EVALUATION COMPLEXITY: Low   GOALS: SHORT TERM GOALS: Target date: 06/11/2021  Patient will be independent with initial HEP and  self-management strategies to improve functional outcomes Baseline:  Goal status: on-going   LONG TERM GOALS: Target date: 07/02/2021  Patient will be independent with advanced HEP and self-management strategies to improve functional outcomes Baseline:  Goal status: on-going   2.  Patient will improve FOTO score to predicted value to indicate improvement in functional outcomes Baseline: (complete next visit) Goal status: on-going  3.  Patient will report reduction of back pain to <3/10 for improved quality of life and ability to perform ADLs  Baseline: 5-6/10 Goal status: on-going   4. Patient will have equal to 5/5 MMT throughout BLE to improve ability to perform functional mobility, stair ambulation, work tasks, and ADLs.  Baseline: See MMT  Goal status: On-going    PLAN: PT FREQUENCY: 2x/week  PT DURATION: 6 weeks  PLANNED INTERVENTIONS: Therapeutic exercises, Therapeutic activity, Neuromuscular re-education, Balance training, Gait training, Patient/Family education, Joint manipulation, Joint mobilization, Stair training, Aquatic Therapy, Dry Needling, Electrical stimulation, Spinal manipulation, Spinal mobilization, Cryotherapy, Moist heat, scar mobilization, Taping, Traction, Ultrasound, Biofeedback, Ionotophoresis '4mg'$ /ml Dexamethasone, and Manual therapy. Marland Kitchen  PLAN FOR NEXT SESSION:  Continue to Check SI joint for proper positioning.  Continue to with Core and glute strength as tolerated. Improve lumbar pain free extension ROM. Attempt to balance out strength in core/lumbar region (Rt side is weaker).  Teena Irani, PTA/CLT [place] Ph: [phone]

## 2021-06-12 ENCOUNTER — Encounter (HOSPITAL_COMMUNITY): Payer: Medicare Other

## 2021-06-15 ENCOUNTER — Encounter (HOSPITAL_COMMUNITY): Payer: Self-pay | Admitting: Physical Therapy

## 2021-06-15 ENCOUNTER — Ambulatory Visit (HOSPITAL_COMMUNITY): Payer: Medicare Other | Attending: Orthopedic Surgery | Admitting: Physical Therapy

## 2021-06-15 DIAGNOSIS — M25552 Pain in left hip: Secondary | ICD-10-CM | POA: Insufficient documentation

## 2021-06-15 DIAGNOSIS — M5459 Other low back pain: Secondary | ICD-10-CM | POA: Insufficient documentation

## 2021-06-15 NOTE — Therapy (Signed)
OUTPATIENT PHYSICAL THERAPY TREATMENT   Patient Name: Duane [name] MRN: [id number] DOB:18-Apr-1960, 61 y.o., male Today's Date: 06/15/2021   PT End of Session - 06/15/21 1433     Visit Number 5    Number of Visits 12    Date for PT Re-Evaluation 07/02/21    Authorization Type Medicare A    Progress Note Due on Visit 10    PT Start Time 1432    PT Stop Time 1510    PT Time Calculation (min) 38 min    Activity Tolerance Patient tolerated treatment well    Behavior During Therapy WFL for tasks assessed/performed              Past Medical History:  Diagnosis Date   Arthritis    Gout    Hypertension    Past Surgical History:  Procedure Laterality Date   ELBOW SURGERY     FOOT FASCIOTOMY     X 5   HEMORRHOID SURGERY     X2, Dr. Arnoldo Morale   NECK EXPLORATION     SHOULDER ARTHROTOMY     multiple    Patient Active Problem List   Diagnosis Date Noted   Abdominal pain 10/31/2019   Constipation 10/31/2019   Leukocytosis 10/31/2019   Dehydration 10/31/2019   Hepatic steatosis 10/31/2019   SIRS (systemic inflammatory response syndrome) ([place]) 10/31/2019   Internal hemorrhoids 10/31/2019   Acute kidney injury superimposed on CKD ([place]) 10/31/2019   Serum total bilirubin elevated 10/31/2019   Patellofemoral pain syndrome of right knee 02/20/2018   History of elbow surgery 10/18/2016   Cubital tunnel syndrome 09/28/2016   Hospital discharge follow-up 09/01/2016   Cough 09/01/2016   Pneumonia due to Mycoplasma pneumoniae 09/01/2016   Lobar pneumonia ([place]) 08/27/2016   Fever, unknown origin 08/26/2016   Sepsis due to undetermined organism ([place]) 08/25/2016   Gram negative sepsis ([place]) 08/25/2016   Bacteremia due to Gram-negative bacteria 08/25/2016   Syncope 08/25/2016   Syncope and collapse 08/24/2016   Sepsis due to pneumonia ([place]) 08/24/2016   Hyperglycemia 08/24/2016   Nausea without vomiting 07/02/2016   Erectile dysfunction 07/02/2016   SLAP lesion of shoulder  08/04/2012   Muscle weakness (generalized) 08/04/2012   Chronic neck and back pain 08/31/2011   Deformity, acquired 08/31/2011   Essential hypertension 08/31/2011   Insomnia 08/31/2011   Epigastric discomfort 05/03/2011   LUMBAR SPRAIN AND STRAIN 10/22/2009   CUBITAL TUNNEL SYNDROME 10/24/2007   SHOULDER PAIN 10/24/2007   OTHER ENTHESOPATHY OF KNEE 07/31/2007   ELBOW PAIN 05/25/2007    PCP: Maximiano Coss NP  REFERRING PROVIDER: Carole Civil, MD   REFERRING DIAG: Diagnosis M54.50 (ICD-10-CM) - Lumbar pain M25.552 (ICD-10-CM) - Pain in left hip   THERAPY DIAG:  Other low back pain  Pain in left hip  ONSET DATE: December/ January 2023  SUBJECTIVE:  SUBJECTIVE STATEMENT: Patient says he feels a little better. Has been doing exercise and stretching.   PERTINENT HISTORY:  NA  PAIN:  Are you having pain? 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   Impression mild spinal alignment asymmetry coronal and sagittal plane seems to be due to muscle spasm mild chronic degenerative changes anterior lips of the vertebrae    PATIENT SURVEYS: foto  50 COGNITION:  Overall cognitive status: Within functional limits for tasks assessed     SENSATION: WFL  PALPATION: Mod TTP about LT lumbar paraspinals, LT greater trochanter   LUMBAR ROM:   Active  A/PROM  06/15/2021  Flexion WFL  Extension 100 % limited  Right lateral flexion WFL  Left lateral flexion WFL  Right  rotation [facility]  Left rotation WFL    (Blank rows = not tested)  LE ROM: AROM WFL   LE MMT:  MMT Right 06/15/2021 Left 06/15/2021  Hip flexion 5 5 (slight pain)  Hip extension 4+ 4 (pain)  Hip abduction 5 4+(pain)  Hip adduction    Hip internal rotation    Hip external rotation    Knee flexion    Knee extension 5 5  Ankle dorsiflexion 5 5  Ankle plantarflexion    Ankle inversion    Ankle eversion     (Blank rows = not tested)  LUMBAR SPECIAL TESTS:  (+) sacral compression    TODAY'S TREATMENT                        06/15/21    POE 1 minute (increases pain in lumbar)    Hip bridge x20 Ab march x20 SLR with ab brace 2 x 10 Iso hip abduction 10 x 5" Iso hip adduction 10 x 5" Dead bug 2 x10 modified  (both arms together, LT holding RT) Leg press 3 plates 3 x 10 Hamstring curl 4 plates 3 x 10 Standing hip abduction GTB 2 x 10 each  Standing hip extension GTB 2 x 10 each    06/10/2021 Supine:   Checked SIJ, in good alignment; LL measured at 95cm each side, equal     Bridge 10 x 2 reps             SLR with core stab 10X2            Marching 10X2 with core stab  Prone:             POE x 1 minute            Heel squeezes 2x10 5" holds   Hip extension 2X10  Scapular retractions with UE's on table 10x Sidelying:   Hip Abduction 2X10 each  Lateral trunk raises 2X10 each Long sitting:  Hamstring stretch 3X20" each LE  Piriformis stretch 3X20"  06/02/2021                       Supine:     Checked SIJ, in good alignment; LL measured at 95cm each side, equal                       Bridge 10  x 2 reps                        SLR with core stab 10X2                       Marching 10X2 with core stab                        Prone:                        POE x 1 minute                       Heel squeezes x 10      PATIENT EDUCATION:  Education details: on evaluation findings, POC and HEP  Person educated: Patient Education method: Explanation Education comprehension:  verbalized understanding and returned demonstration   HOME EXERCISE PROGRAM: 05/21/21 Ab brace with march Ab brace with SLR Bridge   6/5  banded hip abduction/ extension Supine isometric hip abd/ add    ASSESSMENT:  CLINICAL IMPRESSION:    Patient tolerated session well today with no increased complaint of pain. Progressed LE strengthening with adding machine resisted exercise. Also added band resisted hip abduction and extension. Provided cues and demo for proper mechanics, patient showed good return. Updated HEP hand issued handout. Patient will continue to benefit from skilled therapy services to reduce remaining deficits and improve functional ability.    OBJECTIVE IMPAIRMENTS decreased activity tolerance, decreased mobility, decreased ROM, decreased strength, increased fascial restrictions, increased muscle spasms, improper body mechanics, and pain.   ACTIVITY LIMITATIONS cleaning, community activity, driving, meal prep, occupation, laundry, yard work, shopping, and yard work.   PERSONAL FACTORS  None  are also affecting patient's functional outcome.    REHAB POTENTIAL: Good  CLINICAL DECISION MAKING: Stable/uncomplicated  EVALUATION COMPLEXITY: Low   GOALS: SHORT TERM GOALS: Target date: 06/11/2021  Patient will be independent with initial HEP and self-management strategies to improve functional outcomes Baseline:  Goal status: on-going   LONG TERM GOALS: Target date: 07/02/2021  Patient will be independent with advanced HEP and self-management strategies to improve functional outcomes Baseline:  Goal status: on-going   2.  Patient will improve FOTO score to predicted value to indicate improvement in functional outcomes Baseline: (complete next visit) Goal status: on-going  3.  Patient will report reduction of back pain to <3/10 for improved quality of life and ability to perform ADLs  Baseline: 5-6/10 Goal status: on-going   4. Patient will have equal to 5/5  MMT throughout BLE to improve ability to perform functional mobility, stair ambulation, work tasks, and ADLs.  Baseline: See MMT  Goal status: On-going    PLAN: PT FREQUENCY: 2x/week  PT DURATION: 6 weeks  PLANNED INTERVENTIONS: Therapeutic exercises, Therapeutic activity, Neuromuscular re-education, Balance training, Gait training, Patient/Family education, Joint manipulation, Joint mobilization, Stair training, Aquatic Therapy, Dry Needling, Electrical stimulation, Spinal manipulation, Spinal mobilization, Cryotherapy, Moist heat, scar mobilization, Taping, Traction, Ultrasound, Biofeedback, Ionotophoresis '4mg'$ /ml Dexamethasone, and Manual therapy. Marland Kitchen  PLAN FOR NEXT SESSION: Continue to with Core and glute strength as tolerated. Improve lumbar pain free extension ROM. Attempt to balance out strength in core/lumbar region (Rt side is weaker).  3:08 PM, 06/15/21 Josue Hector PT DPT  Physical Therapist with [facility]  [phone]

## 2021-06-17 ENCOUNTER — Telehealth: Payer: Self-pay | Admitting: Registered Nurse

## 2021-06-17 NOTE — Telephone Encounter (Signed)
Left message for patient to call back and schedule Medicare Annual Wellness Visit (AWV). Please offer to do virtually or by telephone.  Left office number and my jabber #[phone]. ? ?Due for AWVI ? ?Please schedule at anytime with Nurse Health Advisor. ?  ?

## 2021-06-18 ENCOUNTER — Ambulatory Visit: Payer: Medicare Other | Admitting: Orthopedic Surgery

## 2021-06-18 ENCOUNTER — Ambulatory Visit (HOSPITAL_COMMUNITY): Payer: Medicare Other | Admitting: Physical Therapy

## 2021-06-23 ENCOUNTER — Encounter (HOSPITAL_COMMUNITY): Payer: Medicare Other | Admitting: Physical Therapy

## 2021-06-24 ENCOUNTER — Ambulatory Visit (HOSPITAL_COMMUNITY): Payer: Medicare Other | Admitting: Occupational Therapy

## 2021-06-25 ENCOUNTER — Encounter (HOSPITAL_COMMUNITY): Payer: Medicare Other | Admitting: Physical Therapy

## 2021-06-30 ENCOUNTER — Encounter (HOSPITAL_COMMUNITY): Payer: Medicare Other | Admitting: Physical Therapy

## 2021-07-02 ENCOUNTER — Encounter (HOSPITAL_COMMUNITY): Payer: Medicare Other | Admitting: Physical Therapy

## 2021-07-06 ENCOUNTER — Telehealth: Payer: Self-pay

## 2021-07-07 ENCOUNTER — Encounter (HOSPITAL_COMMUNITY): Payer: Medicare Other | Admitting: Physical Therapy

## 2021-07-08 ENCOUNTER — Telehealth: Payer: Self-pay | Admitting: Radiology

## 2021-07-08 ENCOUNTER — Encounter: Payer: Self-pay | Admitting: Registered Nurse

## 2021-07-08 ENCOUNTER — Telehealth: Payer: Self-pay | Admitting: Registered Nurse

## 2021-07-08 NOTE — Telephone Encounter (Signed)
Patient called [facility] asking for a RTW note.  He says he was seen by NSU, no surg needed.  We last saw patient 05/14/21.  He says WC case closed, no rating needed/given.  PCP does not want to write him a RTW note, as we saw him after them, and NSU does not want to write a RTW note either.  Please advise?  No appts scheduled with you at this time.

## 2021-07-08 NOTE — Telephone Encounter (Signed)
Printed - we can stamp it and let him know it's ready to pick up at his convenience. Also available in MyChart for him  Thanks,  Denice Paradise

## 2021-07-08 NOTE — Telephone Encounter (Signed)
PT called stating that he need a return to work form.  PT states Duane [name] took him out of work. PT also states that Dr.David Jones told him he has to get form from [name].

## 2021-07-10 ENCOUNTER — Ambulatory Visit (HOSPITAL_COMMUNITY): Payer: Medicare Other | Attending: Orthopedic Surgery | Admitting: Occupational Therapy

## 2021-07-10 NOTE — Telephone Encounter (Signed)
Called and left voicemail letting him know the note was ready.

## 2021-07-17 ENCOUNTER — Encounter (HOSPITAL_COMMUNITY): Payer: Self-pay

## 2021-07-17 ENCOUNTER — Ambulatory Visit (HOSPITAL_COMMUNITY): Payer: Medicare Other | Attending: Orthopedic Surgery

## 2021-07-17 DIAGNOSIS — R29898 Other symptoms and signs involving the musculoskeletal system: Secondary | ICD-10-CM | POA: Diagnosis present

## 2021-07-17 DIAGNOSIS — M25532 Pain in left wrist: Secondary | ICD-10-CM | POA: Diagnosis not present

## 2021-07-17 NOTE — Patient Instructions (Signed)
AROM Exercises   1) Wrist Flexion  Start with wrist at edge of table, palm facing up. With wrist hanging slightly off table, curl wrist upward, and back down.      2) Wrist Extension  Start with wrist at edge of table, palm facing down. With wrist slightly off the edge of the table, curl wrist up and back down.      3) Radial Deviations  Start with forearm flat against a table, wrist hanging slightly off the edge, and palm facing the wall. Bending at the wrist only, and keeping palm facing the wall, bend wrist so fist is pointing towards the floor, back up to start position, and up towards the ceiling. Return to start.        4) WRIST PRONATION  Turn your forearm towards palm face down.  Keep your elbow bent and by the side of your  Body.      5) WRIST SUPINATION  Turn your forearm towards palm face up.  Keep your elbow bent and by the side of your  Body.      *Complete exercises ______ times each, _______ times per day*      Strengthening Exercises  1) WRIST EXTENSION CURLS - TABLE  Hold a small free weight, rest your forearm on a table and bend your wrist up and down with your palm face down as shown.      2) WRIST FLEXION CURLS - TABLE  Hold a small free weight, rest your forearm on a table and bend your wrist up and down with your palm face up as shown.     3) FREE WEIGHT RADIAL/ULNAR DEVIATION - TABLE  Hold a small free weight, rest your forearm on a table and bend your wrist up and down with your palm facing towards the side as shown.     4) Pronation  Forearm supported on table with wrist in neutral position. Using a weight, roll wrist so that palm faces downward. Hold for 2 seconds and return to starting position.     5) Supination  Forearm supported on table with wrist in neutral position. Using a weight, roll wrist so that palm is now facing upward. Hold for 2 seconds and return to starting position.      *Complete  exercises using __1__ pound weight, __10-15__times each, __2-3__times per day*    Home Exercises Program Theraputty Exercises  Do the following exercises 2 times a day using your affected hand.  1. Roll putty into a ball.  2. Make into a pancake.  3. Roll putty into a roll.  4. Pinch along log with first finger and thumb.   5. Make into a ball.  6. Roll it back into a log.   7. Pinch using thumb and side of first finger.  8. Roll into a ball, then flatten into a pancake.  9. Using your fingers, make putty into a mountain.  10. Roll putty back into a ball and squeeze gently for 2-3 minutes.
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
## 2021-07-20 NOTE — Addendum Note (Signed)
Addended byFlonnie Hailstone on: 07/20/2021 08:11 AM   Modules accepted: Orders

## 2021-07-27 ENCOUNTER — Other Ambulatory Visit: Payer: Self-pay | Admitting: Registered Nurse

## 2021-07-27 DIAGNOSIS — I10 Essential (primary) hypertension: Secondary | ICD-10-CM

## 2021-12-31 ENCOUNTER — Ambulatory Visit: Payer: Medicare Other | Admitting: Emergency Medicine

## 2022-03-11 ENCOUNTER — Encounter: Payer: Self-pay | Admitting: Radiology

## 2022-08-11 LAB — BASIC METABOLIC PANEL WITH GFR
BUN: 7 (ref 4–21)
Creatinine: 1 (ref 0.6–1.3)
Glucose: 277

## 2022-08-11 LAB — HEMOGLOBIN A1C: Hemoglobin A1C: 12.2

## 2022-08-11 LAB — COMPREHENSIVE METABOLIC PANEL WITH GFR: eGFR: 89

## 2022-09-30 ENCOUNTER — Telehealth: Payer: Self-pay | Admitting: Orthopedic Surgery
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
## 2022-10-04 NOTE — Telephone Encounter (Signed)
I called the patient and advised Wendy's response, he stated "Ok, I'll get back w/you"

## 2022-11-05 ENCOUNTER — Encounter: Payer: Medicare Other | Admitting: Orthopedic Surgery

## 2022-11-12 ENCOUNTER — Encounter: Payer: Self-pay | Admitting: Orthopedic Surgery

## 2022-11-12 ENCOUNTER — Ambulatory Visit (INDEPENDENT_AMBULATORY_CARE_PROVIDER_SITE_OTHER): Payer: Medicare Other | Admitting: Orthopedic Surgery

## 2022-11-12 ENCOUNTER — Other Ambulatory Visit (INDEPENDENT_AMBULATORY_CARE_PROVIDER_SITE_OTHER): Payer: Self-pay

## 2022-11-12 VITALS — BP 136/94 | HR 108 | Ht 69.0 in | Wt 144.0 lb

## 2022-11-12 DIAGNOSIS — M25542 Pain in joints of left hand: Secondary | ICD-10-CM | POA: Diagnosis not present

## 2022-11-12 DIAGNOSIS — G8929 Other chronic pain: Secondary | ICD-10-CM

## 2022-11-12 DIAGNOSIS — Z8739 Personal history of other diseases of the musculoskeletal system and connective tissue: Secondary | ICD-10-CM | POA: Diagnosis not present

## 2022-11-12 DIAGNOSIS — M7542 Impingement syndrome of left shoulder: Secondary | ICD-10-CM

## 2022-11-12 DIAGNOSIS — M25512 Pain in left shoulder: Secondary | ICD-10-CM

## 2022-11-12 MED ORDER — METHYLPREDNISOLONE ACETATE 40 MG/ML IJ SUSP
40.0000 mg | Freq: Once | INTRAMUSCULAR | Status: AC
  Administered 2022-11-12: 40 mg via INTRA_ARTICULAR

## 2022-11-12 MED ORDER — INDOMETHACIN 50 MG PO CAPS: 50.0000 mg | ORAL_CAPSULE | Freq: Three times a day (TID) | ORAL | 3 refills | Status: AC | PRN

## 2022-11-12 NOTE — Patient Instructions (Signed)
You have received an injection of steroids into the joint. 15% of patients will have increased pain within the 24 hours postinjection.   This is transient and will go away.   We recommend that you use ice packs on the injection site for 20 minutes every 2 hours and extra strength Tylenol 2 tablets every 8 as needed until the pain resolves.  If you continue to have pain after taking the Tylenol and using the ice please call the office for further instructions.  
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
## 2022-12-20 ENCOUNTER — Other Ambulatory Visit (INDEPENDENT_AMBULATORY_CARE_PROVIDER_SITE_OTHER): Payer: Medicare Other

## 2022-12-20 ENCOUNTER — Ambulatory Visit (INDEPENDENT_AMBULATORY_CARE_PROVIDER_SITE_OTHER): Payer: Medicare Other | Admitting: Orthopedic Surgery

## 2022-12-20 ENCOUNTER — Other Ambulatory Visit: Payer: Self-pay | Admitting: Orthopedic Surgery

## 2022-12-20 DIAGNOSIS — M7542 Impingement syndrome of left shoulder: Secondary | ICD-10-CM

## 2022-12-20 DIAGNOSIS — M25532 Pain in left wrist: Secondary | ICD-10-CM

## 2022-12-20 DIAGNOSIS — G8929 Other chronic pain: Secondary | ICD-10-CM
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
## 2023-01-10 ENCOUNTER — Ambulatory Visit (HOSPITAL_COMMUNITY): Payer: Medicare Other | Admitting: Occupational Therapy

## 2023-01-28 ENCOUNTER — Ambulatory Visit (INDEPENDENT_AMBULATORY_CARE_PROVIDER_SITE_OTHER): Payer: Medicare Other | Admitting: Physical Medicine and Rehabilitation

## 2023-01-28 DIAGNOSIS — M25532 Pain in left wrist: Secondary | ICD-10-CM | POA: Diagnosis not present

## 2023-01-28 DIAGNOSIS — R202 Paresthesia of skin: Secondary | ICD-10-CM

## 2023-01-28 DIAGNOSIS — M7542 Impingement syndrome of left shoulder: Secondary | ICD-10-CM
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
## 2023-01-31 ENCOUNTER — Encounter: Payer: Self-pay | Admitting: Physical Medicine and Rehabilitation
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
## 2023-01-31 NOTE — Progress Notes (Signed)
Left message for patient to call back to make appointment to review nerve study

## 2023-01-31 NOTE — Procedures (Signed)
EMG & NCV Findings: Evaluation of the right median motor nerve showed prolonged distal onset latency (4.9 ms) and decreased conduction velocity (Elbow-Wrist, 46 m/s).  The right median (across palm) sensory nerve showed prolonged distal peak latency (Wrist, 5.0 ms).  The right ulnar sensory nerve showed prolonged distal peak latency (4.0 ms), reduced amplitude (14.5 V), and decreased conduction velocity (Wrist-5th Digit, 35 m/s).  All remaining nerves (as indicated in the following tables) were within normal limits.    Needle evaluation of the right abductor pollicis brevis muscle showed increased insertional activity and diminished recruitment.  All remaining muscles (as indicated in the following table) showed no evidence of electrical instability.    Impression: The above electrodiagnostic study is ABNORMAL and reveals evidence of a moderate to severe left median nerve entrapment at the wrist (carpal tunnel syndrome) affecting sensory and motor components.   There is no significant electrodiagnostic evidence of any other focal nerve entrapment, brachial plexopathy or cervical radiculopathy.   Recommendations: 1.  Follow-up with referring physician. 2.  Continue current management of symptoms. 3.  Suggest surgical evaluation.  ___________________________ Naaman Plummer FAAPMR Board Certified, American Board of Physical Medicine and Rehabilitation    Nerve Conduction Studies Anti Sensory Summary Table   Stim Site NR Peak (ms) Norm Peak (ms) P-T Amp (V) Norm P-T Amp Site1 Site2 Delta-P (ms) Dist (cm) Vel (m/s) Norm Vel (m/s)  Right Median Acr Palm Anti Sensory (2nd Digit)  30.8C  Wrist    *5.0 <3.6 11.1 >10 Wrist Palm 3.5 0.0    Palm    1.5 <2.0 3.9         Right Radial Anti Sensory (Base 1st Digit)  29.6C  Wrist    2.8 <3.1 24.4  Wrist Base 1st Digit 2.8 0.0    Right Ulnar Anti Sensory (5th Digit)  30.4C  Wrist    *4.0 <3.7 *14.5 >15.0 Wrist 5th Digit 4.0 14.0 *35 >38   Motor  Summary Table   Stim Site NR Onset (ms) Norm Onset (ms) O-P Amp (mV) Norm O-P Amp Site1 Site2 Delta-0 (ms) Dist (cm) Vel (m/s) Norm Vel (m/s)  Right Median Motor (Abd Poll Brev)  29.7C  Wrist    *4.9 <4.2 8.1 >5 Elbow Wrist 5.0 23.0 *46 >50  Elbow    9.9  7.2         Right Ulnar Motor (Abd Dig Min)  29.8C  Wrist    3.7 <4.2 10.3 >3 B Elbow Wrist 4.0 21.0 53 >53  B Elbow    7.7  9.4  A Elbow B Elbow 1.7 9.0 53 >53  A Elbow    9.4  8.9          EMG   Side Muscle Nerve Root Ins Act Fibs Psw Amp Dur Poly Recrt Int Dennie Bible Comment  Right Abd Poll Brev Median C8-T1 *Incr Nml Nml Nml Nml 0 *Reduced Nml   Right 1stDorInt Ulnar C8-T1 Nml Nml Nml Nml Nml 0 Nml Nml   Right PronatorTeres Median C6-7 Nml Nml Nml Nml Nml 0 Nml Nml   Right Biceps Musculocut C5-6 Nml Nml Nml Nml Nml 0 Nml Nml     Nerve Conduction Studies Anti Sensory Left/Right Comparison   Stim Site L Lat (ms) R Lat (ms) L-R Lat (ms) L Amp (V) R Amp (V) L-R Amp (%) Site1 Site2 L Vel (m/s) R Vel (m/s) L-R Vel (m/s)  Median Acr Palm Anti Sensory (2nd Digit)  30.8C  Wrist  *5.0  11.1  Wrist Palm     Palm  1.5   3.9        Radial Anti Sensory (Base 1st Digit)  29.6C  Wrist  2.8   24.4  Wrist Base 1st Digit     Ulnar Anti Sensory (5th Digit)  30.4C  Wrist  *4.0   *14.5  Wrist 5th Digit  *35    Motor Left/Right Comparison   Stim Site L Lat (ms) R Lat (ms) L-R Lat (ms) L Amp (mV) R Amp (mV) L-R Amp (%) Site1 Site2 L Vel (m/s) R Vel (m/s) L-R Vel (m/s)  Median Motor (Abd Poll Brev)  29.7C  Wrist  *4.9   8.1  Elbow Wrist  *46   Elbow  9.9   7.2        Ulnar Motor (Abd Dig Min)  29.8C  Wrist  3.7   10.3  B Elbow Wrist  53   B Elbow  7.7   9.4  A Elbow B Elbow  53   A Elbow  9.4   8.9           Waveforms:

## 2023-03-01 ENCOUNTER — Ambulatory Visit (INDEPENDENT_AMBULATORY_CARE_PROVIDER_SITE_OTHER): Payer: Medicare Other | Admitting: Orthopedic Surgery

## 2023-03-01 DIAGNOSIS — G5602 Carpal tunnel syndrome, left upper limb: Secondary | ICD-10-CM
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
## 2023-03-24 ENCOUNTER — Other Ambulatory Visit: Payer: Self-pay | Admitting: Orthopedic Surgery

## 2023-03-24 DIAGNOSIS — G5602 Carpal tunnel syndrome, left upper limb: Secondary | ICD-10-CM | POA: Diagnosis not present

## 2023-03-24 MED ORDER — ACETAMINOPHEN-CODEINE 300-30 MG PO TABS: 1.0000 | ORAL_TABLET | Freq: Four times a day (QID) | ORAL | 0 refills | Status: DC | PRN

## 2023-03-25 ENCOUNTER — Other Ambulatory Visit: Payer: Self-pay | Admitting: Orthopedic Surgery

## 2023-03-25 DIAGNOSIS — G5602 Carpal tunnel syndrome, left upper limb: Secondary | ICD-10-CM

## 2023-04-07 ENCOUNTER — Ambulatory Visit (INDEPENDENT_AMBULATORY_CARE_PROVIDER_SITE_OTHER): Payer: Medicare Other | Admitting: Orthopedic Surgery

## 2023-04-07 DIAGNOSIS — Z9889 Other specified postprocedural states: Secondary | ICD-10-CM

## 2023-04-07 NOTE — Progress Notes (Signed)
   Duane [name] - 63 y.o. male MRN [id number]  Date of birth: March 10, 1960  Office Visit Note: Visit Date: 04/07/2023 PCP: Georgina Quint, MD Referred by: Georgina Quint, *  Subjective:  HPI: Duane [name] is a 63 y.o. male who presents today for follow up 2 weeks status post left open carpal tunnel release.  He is doing well overall, has been compliant with wound care.  States that his numbness and tingling has improved drastically and is pleased with his progress.  Pertinent ROS were reviewed with the patient and found to be negative unless otherwise specified above in HPI.   Assessment & Plan: Visit Diagnoses:  1. S/P carpal tunnel release     Plan: He is doing well postoperatively.  Sutures removed today without incident.  Demonstrates appropriate wound healing.  He has Occupational Therapy arranged for later this week to begin exercises with progression to strengthening as tolerated.  Can transition to home exercise when appropriate.  Follow-up in approximate 1 month to recheck his progress.  Follow-up: No follow-ups on file.   Meds & Orders: No orders of the defined types were placed in this encounter.  No orders of the defined types were placed in this encounter.    Procedures: No procedures performed       Objective:   Vital Signs: There were no vitals taken for this visit.  Ortho Exam Left hand: - Well-healing palmar incision, sutures removed, skin edges well-approximated without erythema or drainage - Composite fist without restriction - Sensation intact to light touch in the median nerve distribution - 5/5 APB minimal thenar atrophy    Imaging: No results found.   Rebekka Lobello Trevor Mace, M.D. [facility], Hand Surgery
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
## 2023-04-12 ENCOUNTER — Encounter: Payer: Self-pay | Admitting: Rehabilitative and Restorative Service Providers"

## 2023-04-12 ENCOUNTER — Ambulatory Visit (INDEPENDENT_AMBULATORY_CARE_PROVIDER_SITE_OTHER): Admitting: Rehabilitative and Restorative Service Providers"

## 2023-04-12 DIAGNOSIS — M25532 Pain in left wrist: Secondary | ICD-10-CM

## 2023-04-12 DIAGNOSIS — M6281 Muscle weakness (generalized): Secondary | ICD-10-CM | POA: Diagnosis not present

## 2023-04-12 DIAGNOSIS — R278 Other lack of coordination: Secondary | ICD-10-CM

## 2023-04-12 DIAGNOSIS — R202 Paresthesia of skin: Secondary | ICD-10-CM

## 2023-04-12 NOTE — Patient Instructions (Signed)
  Duane [name]'s Carpal Tunnel Release Recommendations   Do not to soak your wound for at least 1 week, or until it looks well healed. Bathe as normal now. You can put a Vaseline or plain lotion on your closed wound to help the scar heal smoothly. (Don't leave it looking dry and crusty.)   Try not to lean on your palm, sleep on your hand or position your wrist in a bent position for a long time. These things put pressure on your healing nerve. Your sensation (or tingling/numbness) should slowly improve as your nerve heals, but it can be normal to take 3-6 months to fully heal.   Avoid any strong gripping, push, pull, weight bearing or repetitive motion with your surgical arm for the next 4-6 weeks.  (If it hurts very badly- don't do it!) After 4-6 weeks, you can progressively return to all normal, light activities. Sports and heavy weightlifting should be withheld for about 3 months.   Start touching/rubbing your scar and gently trying to shift the top layer of your skin. Rub, touch gently for 2-3 minutes, 3-4 x day.      It's normal to have some "buzzing" or "burning" pain after a nerve surgery. If you have any tingling or itching/burning around your scar, just lightly rub/brush it with a cloth for 2-3 mins to calm your nerves. Do it progressively and rub and touch more aggressively as the weeks go on. Keep it pretty light for the first 3-4 weeks.    Do the following exercises 4 times a day, non-painfully, feeling light to medium tension.  (See exercise sheet) If you're sore- slow down!   If you're stiff- speed up!   Wait 5-6 weeks after surgery to use "squeeze balls" or "hand grippers"  Wait 7-8 weeks after surgery to start strengthening in your arm in a gym setting  In general, keep your hand and wrist moving, and don't let it heal stiffly.   If you are having any new problems, lingering stiffness, pain, signs of infection (redness, swelling, oozing puss, tenderness), contact your surgeon  immediately.  He may send you to formal hand therapy.

## 2023-05-20 NOTE — Patient Instructions (Signed)

## 2023-05-26 ENCOUNTER — Encounter: Payer: Self-pay | Admitting: Nurse Practitioner

## 2023-05-26 ENCOUNTER — Ambulatory Visit (INDEPENDENT_AMBULATORY_CARE_PROVIDER_SITE_OTHER): Payer: Medicare Other | Admitting: Nurse Practitioner

## 2023-05-26 VITALS — BP 130/76 | HR 78 | Ht 69.0 in | Wt 149.6 lb

## 2023-05-26 DIAGNOSIS — Z7984 Long term (current) use of oral hypoglycemic drugs: Secondary | ICD-10-CM

## 2023-05-26 DIAGNOSIS — E1165 Type 2 diabetes mellitus with hyperglycemia: Secondary | ICD-10-CM | POA: Diagnosis not present

## 2023-05-26 DIAGNOSIS — I10 Essential (primary) hypertension: Secondary | ICD-10-CM | POA: Diagnosis not present

## 2023-05-26 DIAGNOSIS — E559 Vitamin D deficiency, unspecified: Secondary | ICD-10-CM | POA: Diagnosis not present

## 2023-05-26 DIAGNOSIS — Z794 Long term (current) use of insulin: Secondary | ICD-10-CM

## 2023-05-26 LAB — POCT GLYCOSYLATED HEMOGLOBIN (HGB A1C): Hemoglobin A1C: 11.5 % — AB (ref 4.0–5.6)

## 2023-05-26 MED ORDER — ACCU-CHEK SOFTCLIX LANCETS MISC: 12 refills | Status: AC

## 2023-05-26 MED ORDER — ACCU-CHEK GUIDE TEST VI STRP: ORAL_STRIP | 12 refills | Status: AC

## 2023-05-26 MED ORDER — ACCU-CHEK GUIDE ME W/DEVICE KIT: PACK | 0 refills | Status: AC

## 2023-05-26 NOTE — Progress Notes (Signed)
 Endocrinology Consult Note       05/26/2023, 11:05 AM   Subjective:    Patient ID: Duane [name], male    DOB: 1960-11-15.  Duane [name] is being seen in consultation for management of currently uncontrolled symptomatic diabetes requested by  Kathyleen Parkins, MD.  He notes he was seen previously in this clinic for similar issue but there are no records of this (may have been before it was a [facility] facility).   Past Medical History:  Diagnosis Date   Arthritis    Gout    Hypertension     Past Surgical History:  Procedure Laterality Date   ELBOW SURGERY     FOOT FASCIOTOMY     X 5   HEMORRHOID SURGERY     X2, Dr. Larrie Po   NECK EXPLORATION     SHOULDER ARTHROTOMY     multiple     Social History   Socioeconomic History   Marital status: Unknown    Spouse name: Not on file   Number of children: 0   Years of education: Not on file   Highest education level: Not on file  Occupational History   Occupation: Emergency planning/management officer    Employer: [employer]  Tobacco Use   Smoking status: Never   Smokeless tobacco: Never  Vaping Use   Vaping status: Never Used  Substance and Sexual Activity   Alcohol use: Never   Drug use: Never   Sexual activity: Not Currently  Other Topics Concern   Not on file  Social History Narrative   ** Merged History Encounter **       Social Drivers of Corporate investment banker Strain: Not on file  Food Insecurity: Not on file  Transportation Needs: Not on file  Physical Activity: Not on file  Stress: Not on file  Social Connections: Not on file    Family History  Problem Relation Age of Onset   Diabetes Mother    COPD Father    Colon cancer Neg Hx     Outpatient Encounter Medications as of 05/26/2023  Medication Sig   Accu-Chek Softclix Lancets lancets Use as instructed to monitor glucose twice daily   acetaminophen  (TYLENOL ) 650 MG CR tablet Take 1,300  mg by mouth every 8 (eight) hours as needed for pain.   amLODipine  (NORVASC ) 5 MG tablet TAKE 1 TABLET BY MOUTH ONCE A DAY.   Blood Glucose Monitoring Suppl (ACCU-CHEK GUIDE ME) w/Device KIT Use to check glucose twice daily   cyclobenzaprine (FLEXERIL) 10 MG tablet SMARTSIG:1.5 Tablet(s) By Mouth Every 8 Hours   dextromethorphan -guaiFENesin  (MUCINEX  DM) 30-600 MG 12hr tablet Take 1 tablet by mouth 2 (two) times daily.   DULoxetine (CYMBALTA) 30 MG capsule Take 30 mg by mouth daily.   enalapril  (VASOTEC ) 20 MG tablet TAKE (1) TABLET BY MOUTH ONCE DAILY.   glucose blood (ACCU-CHEK GUIDE TEST) test strip Use as instructed to monitor glucose twice daily   HYDROmorphone  (DILAUDID ) 4 MG tablet Take 4 mg by mouth every 4 (four) hours as needed.   indomethacin  (INDOCIN ) 50 MG capsule Take 1 capsule (50 mg total) by mouth 3 (three) times daily as needed.  ondansetron  (ZOFRAN -ODT) 8 MG disintegrating tablet Take 1 tablet (8 mg total) by mouth every 8 (eight) hours as needed for nausea or vomiting.   OVER THE COUNTER MEDICATION NMNH 250 mg - patient states he takes 1 by mouth daily.   promethazine  (PHENERGAN ) 6.25 MG/5ML syrup Take 5 mLs (6.25 mg total) by mouth every 6 (six) hours as needed for nausea or vomiting.   tiZANidine  (ZANAFLEX ) 4 MG tablet TAKE (1) TABLET BY MOUTH EVERY SIX HOURS AS NEEDED.   [DISCONTINUED] acetaminophen -codeine  (TYLENOL  #3) 300-30 MG tablet Take 1 tablet by mouth every 6 (six) hours as needed.   [DISCONTINUED] OVER THE COUNTER MEDICATION    No facility-administered encounter medications on file as of 05/26/2023.    ALLERGIES: Allergies  Allergen Reactions   Codeine     Escitalopram  Other (See Comments)    Patient states it caused night terrors   Hydrocodone Nausea And Vomiting   Oxycodone  Nausea And Vomiting   Penicillins Nausea Only    .Has patient had a PCN reaction causing immediate rash, facial/tongue/throat swelling, SOB or lightheadedness with hypotension: No Has  patient had a PCN reaction causing severe rash involving mucus membranes or skin necrosis: No  Has patient had a PCN reaction that required hospitalization: No Has patient had a PCN reaction occurring within the last 10 years: No If all of the above answers are "NO", then may proceed with Cephalosporin use.    Penicillins    Trazodone  Other (See Comments)    Increased BP and HR per patient   Levofloxacin Rash    VACCINATION STATUS:  There is no immunization history on file for this patient.  Diabetes He presents for his initial diabetic visit. He has type 2 (PCP questions whether he may actually be type 1) diabetes mellitus. Onset time: He does not believe he actually has diabetes. His disease course has been stable. There are no hypoglycemic associated symptoms. There are no hypoglycemic complications. There are no diabetic complications. Risk factors for coronary artery disease include diabetes mellitus, family history, male sex and hypertension. Current diabetic treatment includes oral agent (monotherapy) and insulin  injections. He is compliant with treatment most of the time. His weight is fluctuating minimally. He is following a generally unhealthy (says he is doing a carnivore diet so to speak) diet. When asked about meal planning, he reported none. He has not had a previous visit with a dietitian. He rarely participates in exercise. (He presents today for his consultation, with no meter or logs to review.  He does not monitor glucose at home.  His POCT A1c today is 11.5%, improving slightly from last A1c in July of 12.2%.  He notes he was once on Lantus but stopped that a long time ago.  When I asked about the Metformin that was on his referral list, he had no idea what I was talking about.  He drinks mostly black coffee and water.  He will occasionally have a gatorade.  He was previously drinking more than a 2L of soda per day but cut that out.  He eats 1 meal per day and snacks at night.  He  does not engage in routine physical activity, has a sedentary job driving for the [employer]  She is overdue for eye exam, has never seen podiatry in the past.) An ACE inhibitor/angiotensin II receptor blocker is being taken. He does not see a podiatrist.Eye exam is not current.     Review of systems  Constitutional: + Minimally fluctuating body weight,  current Body mass index is 22.09 kg/m., no fatigue, no subjective hyperthermia, no subjective hypothermia Eyes: no blurry vision, no xerophthalmia ENT: no sore throat, no nodules palpated in throat, no dysphagia/odynophagia, no hoarseness Cardiovascular: no chest pain, no shortness of breath, no palpitations, no leg swelling Respiratory: no cough, no shortness of breath Gastrointestinal: no nausea/vomiting/diarrhea Musculoskeletal: no muscle/joint aches, right arm deformity from childbirth Skin: no rashes, no hyperemia Neurological: no tremors, no numbness, no tingling, no dizziness Psychiatric: no depression, no anxiety  Objective:      BP 130/76 (BP Location: Left Arm, Patient Position: Sitting)   Pulse 78   Ht 5\' 9"  (1.753 m)   Wt 149 lb 9.6 oz (67.9 kg)   BMI 22.09 kg/m   Wt Readings from Last 3 Encounters:  05/26/23 149 lb 9.6 oz (67.9 kg)  11/12/22 144 lb (65.3 kg)  05/04/21 146 lb 3.2 oz (66.3 kg)     BP Readings from Last 3 Encounters:  05/26/23 130/76  11/12/22 (!) 136/94  05/04/21 (!) 139/93     Physical Exam- Limited  Constitutional:  Body mass index is 22.09 kg/m. , not in acute distress, normal state of mind Eyes:  EOMI, no exophthalmos Neck: Supple Cardiovascular: RRR, no murmurs, rubs, or gallops, no edema Respiratory: Adequate breathing efforts, no crackles, rales, rhonchi, or wheezing Musculoskeletal: right arm deformity from child birth, strength intact in all four extremities, no gross restriction of joint movements Skin:  no rashes, no hyperemia Neurological: no tremor with outstretched  hands   Diabetic Foot Exam - Simple   No data filed      CMP ( most recent) CMP     Component Value Date/Time   NA 135 10/14/2020 0815   NA 131 (L) 09/18/2019 1704   K 3.9 10/14/2020 0815   CL 94 (L) 10/14/2020 0815   CO2 29 10/14/2020 0815   GLUCOSE 296 (H) 10/14/2020 0815   BUN 7 08/11/2022 0000   CREATININE 1.0 08/11/2022 0000   CREATININE 1.04 10/14/2020 0815   CALCIUM 9.5 10/14/2020 0815   PROT 7.9 10/14/2020 0815   PROT 7.8 09/18/2019 1704   ALBUMIN 4.7 10/14/2020 0815   ALBUMIN 4.9 09/18/2019 1704   AST 19 10/14/2020 0815   ALT 19 10/14/2020 0815   ALKPHOS 95 10/14/2020 0815   BILITOT 1.0 10/14/2020 0815   BILITOT 1.2 09/18/2019 1704   GFR 79.25 10/14/2020 0815   EGFR 89 08/11/2022 0000   GFRNONAA >60 11/01/2019 1402     Diabetic Labs (most recent): Lab Results  Component Value Date   HGBA1C 11.5 (A) 05/26/2023   HGBA1C 12.2 08/11/2022   HGBA1C 10.7 (H) 11/01/2019     Lipid Panel ( most recent) Lipid Panel     Component Value Date/Time   CHOL 183 10/14/2020 0815   CHOL 159 11/29/2018 1717   TRIG 178.0 (H) 10/14/2020 0815   HDL 48.50 10/14/2020 0815   HDL 44 11/29/2018 1717   CHOLHDL 4 10/14/2020 0815   VLDL 35.6 10/14/2020 0815   LDLCALC 99 10/14/2020 0815   LDLCALC 87 11/29/2018 1717   LABVLDL 28 11/29/2018 1717      Lab Results  Component Value Date   TSH 1.350 08/24/2016           Assessment & Plan:   1) Type 2 diabetes mellitus with hyperglycemia, with long-term current use of insulin  (HCC) (Primary)  He presents today for his consultation, with no meter or logs to review.  He does not monitor glucose  at home.  His POCT A1c today is 11.5%, improving slightly from last A1c in July of 12.2%.  He notes he was once on Lantus but stopped that a long time ago.  When I asked about the Metformin that was on his referral list, he had no idea what I was talking about.  He drinks mostly black coffee and water.  He will occasionally have a  gatorade.  He was previously drinking more than a 2L of soda per day but cut that out.  He eats 1 meal per day and snacks at night.  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  She is overdue for eye exam, has never seen podiatry in the past.  - Duane [name] has currently uncontrolled symptomatic type 2 DM for unknown period of time, with most recent A1c of 11.5 %.   -Recent labs reviewed.  - I had a long discussion with him about the progressive nature of diabetes and the pathology behind its complications. -his diabetes is not currently complicated but he remains at a high risk for more acute and chronic complications which include CAD, CVA, CKD, retinopathy, and neuropathy. These are all discussed in detail with him.  The following Lifestyle Medicine recommendations according to American College of Lifestyle Medicine [facility]) were discussed and offered to patient and he agrees to start the journey:  A. Whole Foods, Plant-based plate comprising of fruits and vegetables, plant-based proteins, whole-grain carbohydrates was discussed in detail with the patient.   A list for source of those nutrients were also provided to the patient.  Patient will use only water or unsweetened tea for hydration. B.  The need to stay away from risky substances including alcohol, smoking; obtaining 7 to 9 hours of restorative sleep, at least 150 minutes of moderate intensity exercise weekly, the importance of healthy social connections,  and stress reduction techniques were discussed. C.  A full color page of  Calorie density of various food groups per pound showing examples of each food groups was provided to the patient.  - I have counseled him on diet and weight management by adopting a carbohydrate restricted/protein rich diet. Patient is encouraged to switch to unprocessed or minimally processed complex starch and increased protein intake (animal or plant  source), fruits, and vegetables. -  he is advised to stick to a routine mealtimes to eat 3 meals a day and avoid unnecessary snacks (to snack only to correct hypoglycemia).   - he acknowledges that there is a room for improvement in his food and drink choices. - Suggestion is made for him to avoid simple carbohydrates from his diet including Cakes, Sweet Desserts, Ice Cream, Soda (diet and regular), Sweet Tea, Candies, Chips, Cookies, Store Bought Juices, Alcohol in Excess of 1-2 drinks a day, Artificial Sweeteners, Coffee Creamer, and "Sugar-free" Products. This will help patient to have more stable blood glucose profile and potentially avoid unintended weight gain.  - I have approached him with the following individualized plan to manage his diabetes and patient agrees:   -He is not currently taking any medications for his diabetes.  There was some question if he could have type 1 diabetes vs type 2 which is treated differently.  Will check insulin  and cpeptide levels as well as GAD and insulin  antibodies to assess for this.  Once I get the labs back, we may initiate some medication to help bring his A1c to goal.  -he is  encouraged to start/continue monitoring glucose 2 times daily, before breakfast and before bed, to log their readings on the clinic sheets provided, and bring them to review at follow up appointment in 3 months.  - Adjustment parameters are given to him for hypo and hyperglycemia in writing. - he is encouraged to call clinic for blood glucose levels less than 70 or above 300 mg /dl.  - he is not an ideal candidate for incretin therapy given body habitus and BMI <25.  - Specific targets for  A1c; LDL, HDL, and Triglycerides were discussed with the patient.  2) Blood Pressure /Hypertension:  his blood pressure is controlled to target.   he is advised to continue his current medications as prescribed by his PCP.  3) Lipids/Hyperlipidemia:    There is no recent lipid panel  available to review.  He is not currently on any lipid lowering medications.  Will check lipid panel prior to next visit.  4)  Weight/Diet:  his Body mass index is 22.09 kg/m.  - he is NOT a candidate for weight loss.  Exercise, and detailed carbohydrates information provided  -  detailed on discharge instructions.  5) Chronic Care/Health Maintenance: -he is on ACEI/ARB and not on Statin medications and is encouraged to initiate and continue to follow up with Ophthalmology, Dentist, Podiatrist at least yearly or according to recommendations, and advised to stay away from smoking. I have recommended yearly flu vaccine and pneumonia vaccine at least every 5 years; moderate intensity exercise for up to 150 minutes weekly; and sleep for at least 7 hours a day.  - he is advised to maintain close follow up with Kathyleen Parkins, MD for primary care needs, as well as his other providers for optimal and coordinated care.   - Time spent in this patient care: 80 min, which was spent in counseling him about his diabetes and the rest reviewing his blood glucose logs, discussing his hypoglycemia and hyperglycemia episodes, reviewing his current and previous labs/studies (including abstraction from other facilities) and medications doses and developing a long term treatment plan based on the latest standards of care/guidelines; and documenting his care.    Please refer to Patient Instructions for Blood Glucose Monitoring and Insulin /Medications Dosing Guide" in media tab for additional information. Please also refer to "Patient Self Inventory" in the Media tab for reviewed elements of pertinent patient history.  Ida Mains participated in the discussions, expressed understanding, and voiced agreement with the above plans.  All questions were answered to his satisfaction. he is encouraged to contact clinic should he have any questions or concerns prior to his return visit.     Follow up plan: - Return in  about 3 months (around 08/26/2023) for Diabetes F/U with A1c in office, Bring meter and logs, Previsit labs.    Hulon Magic, [facility] [address] [place], [place] [address] Phone: [phone] Fax: [phone]  05/26/2023, 11:05 AM

## 2023-06-27 ENCOUNTER — Ambulatory Visit (INDEPENDENT_AMBULATORY_CARE_PROVIDER_SITE_OTHER): Admitting: Orthopedic Surgery

## 2023-06-27 DIAGNOSIS — Z9889 Other specified postprocedural states: Secondary | ICD-10-CM | POA: Diagnosis not present

## 2023-06-27 NOTE — Progress Notes (Signed)
   KACEE KOREN - 63 y.o. male MRN [id number]  Date of birth: [birth year]  Office Visit Note: Visit Date: 06/27/2023 PCP: Kathyleen Parkins, MD Referred by: Kathyleen Parkins, MD  Subjective:  HPI: Duane [name] is a 63 y.o. male who presents today for follow up 13 weeks status post left open carpal tunnel release.  Still having some hypersensitivity along the incisional site, numbness and tingling has resolved in the hand.  Nocturnal symptoms have improved.  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  Pertinent ROS were reviewed with the patient and found to be negative unless otherwise specified above in HPI.   Assessment & Plan: Visit Diagnoses:  1. S/P carpal tunnel release     Plan: Based on his clinical examination today, he is doing quite well overall.  He does have some pillar pain along the ulnar aspect of the incisional region which is improving.  Sensation is appropriate in the median nerve distribution as is the motor function.  We did explain that hypersensitivity can continue to improve with the desensitization exercises that has been given by occupational therapy.  As far as firing a gun, I do feel that the impact and recoil will create too much pain in this region right now, would prefer that he refrains from this activity for the time being moving forward.  Given that his right hand is disabled, he is unable to work currently with the left hand exclusively.  I will plan on seeing in approximately 6 to 8 weeks to track his progress from a hypersensitivity and pillar pain standpoint.  Follow-up: No follow-ups on file.   Meds & Orders: No orders of the defined types were placed in this encounter.  No orders of the defined types were placed in this encounter.    Procedures: No procedures performed       Objective:   Vital Signs: There were no vitals taken for this visit.  Ortho Exam Left hand: - Well-healed palmar incision, hypersensitivity  along the ulnar aspect with deep palpation - Composite fist without restriction - Sensation intact light touch in median nerve distribution - 5/5 APB no thenar atrophy - Grip strength Jamar 2, Right not tested, Left 65   Imaging: No results found.   Clarann Helvey Alvia Jointer, M.D. [facility], Hand Surgery

## 2023-07-07 ENCOUNTER — Encounter: Payer: Self-pay | Admitting: Orthopedic Surgery

## 2023-07-07 ENCOUNTER — Ambulatory Visit: Admitting: Orthopedic Surgery

## 2023-07-07 DIAGNOSIS — M7542 Impingement syndrome of left shoulder: Secondary | ICD-10-CM

## 2023-07-07 MED ORDER — METHYLPREDNISOLONE ACETATE 40 MG/ML IJ SUSP
40.0000 mg | Freq: Once | INTRAMUSCULAR | Status: AC
  Administered 2023-07-07: 40 mg via INTRA_ARTICULAR

## 2023-07-07 NOTE — Progress Notes (Signed)
 Left shoulder pain  Presumptive diagnosis of impingement syndrome   November 2024; subacromial injection did well  Starting to have some pain in the anterior part of the shoulder  The patient can still lift his arm well over the head  He does have some tenderness just on the anterolateral edge of the acromion but I think an injection should be sufficient  Procedure note the subacromial injection shoulder left   Verbal consent was obtained to inject the  Left   Shoulder  Timeout was completed to confirm the injection site is a subacromial space of the  left  shoulder  Medication used Depo-Medrol  40 mg and lidocaine 1% 3 cc  Anesthesia was provided by ethyl chloride  The injection was performed in the left  posterior subacromial space. After pinning the skin with alcohol and anesthetized the skin with ethyl chloride the subacromial space was injected using a 20-gauge needle. There were no complications  Sterile dressing was applied.

## 2023-08-26 ENCOUNTER — Ambulatory Visit: Admitting: Nurse Practitioner

## 2023-08-26 DIAGNOSIS — E559 Vitamin D deficiency, unspecified: Secondary | ICD-10-CM

## 2023-08-26 DIAGNOSIS — E1165 Type 2 diabetes mellitus with hyperglycemia: Secondary | ICD-10-CM

## 2023-08-26 DIAGNOSIS — Z7984 Long term (current) use of oral hypoglycemic drugs: Secondary | ICD-10-CM

## 2023-08-26 DIAGNOSIS — Z794 Long term (current) use of insulin: Secondary | ICD-10-CM

## 2023-08-26 DIAGNOSIS — I10 Essential (primary) hypertension: Secondary | ICD-10-CM

## 2023-10-07 ENCOUNTER — Ambulatory Visit: Admitting: Orthopedic Surgery

## 2023-10-12 ENCOUNTER — Ambulatory Visit (INDEPENDENT_AMBULATORY_CARE_PROVIDER_SITE_OTHER): Admitting: Orthopedic Surgery

## 2023-10-12 DIAGNOSIS — M7542 Impingement syndrome of left shoulder: Secondary | ICD-10-CM | POA: Diagnosis not present

## 2023-10-12 MED ORDER — METHYLPREDNISOLONE ACETATE 40 MG/ML IJ SUSP
40.0000 mg | Freq: Once | INTRAMUSCULAR | Status: AC
  Administered 2023-10-12: 40 mg via INTRA_ARTICULAR

## 2023-10-12 NOTE — Progress Notes (Signed)
   Chief Complaint  Patient presents with   Injections    Left shoulder injection   Procedure note the subacromial injection shoulder left   Verbal consent was obtained to inject the  Left   Shoulder  Timeout was completed to confirm the injection site is a subacromial space of the  left  shoulder  Medication used Depo-Medrol  40 mg and lidocaine 1% 3 cc  Anesthesia was provided by ethyl chloride  The injection was performed in the left  posterior subacromial space. After pinning the skin with alcohol and anesthetized the skin with ethyl chloride the subacromial space was injected using a 20-gauge needle. There were no complications  Sterile dressing was applied.  C/o pain anterior shoulder and posterior acromial area    Physical Exam Musculoskeletal:     Left shoulder: Tenderness present. No swelling, deformity, effusion, laceration or crepitus. Normal range of motion. Normal strength.     Comments: STRENGTH   5/5 Sspinatus, infraspinatus, normal belly press  + impingement sign     Encounter Diagnosis  Name Primary?   Impingement syndrome of left shoulder Yes   MRI left shoulder

## 2023-10-12 NOTE — Patient Instructions (Signed)
 While we are working on your approval for MRI please go ahead and call to schedule your appointment with [facility] within at least one (1) week.   Central Scheduling [phone]

## 2023-10-12 NOTE — Progress Notes (Signed)
    10/12/2023   No chief complaint on file.   No diagnosis found.  What pharmacy do you use ? _[facility]__________________________  DOI/DOS/ Date: 10/12/23  Unchanged

## 2023-10-23 ENCOUNTER — Ambulatory Visit (HOSPITAL_COMMUNITY)
Admission: RE | Admit: 2023-10-23 | Discharge: 2023-10-23 | Disposition: A | Discharge status: Home / Self Care | Source: Ambulatory Visit | Attending: Orthopedic Surgery | Admitting: Orthopedic Surgery

## 2023-10-23 DIAGNOSIS — M7542 Impingement syndrome of left shoulder: Secondary | ICD-10-CM | POA: Diagnosis present

## 2023-11-14 ENCOUNTER — Encounter: Payer: Self-pay | Admitting: Radiology

## 2023-11-23 ENCOUNTER — Telehealth: Payer: Self-pay

## 2023-11-23 NOTE — Telephone Encounter (Signed)
 Negative.  Not taking him as a new patient.  Thanks.

## 2023-11-23 NOTE — Telephone Encounter (Signed)
 Called patient and lvm stating that Dr segardia is no longer accepting new patients at this time and that he can call our office to schedule a NP patient appointment with a NP or he can contact other offices and see if there MD's is taking new patients

## 2023-11-23 NOTE — Telephone Encounter (Signed)
**Note De-identified  [place] Obfuscation** Please advise 

## 2023-11-23 NOTE — Telephone Encounter (Signed)
 Copied from CRM [id number]. Topic: Appointments - Scheduling Inquiry for Clinic >> Nov 22, 2023  3:19 PM Tinnie C wrote: Reason for CRM: Amato desires to be a patient of Dr. Purcell again, states he say him at [facility] at another practice and would like him to be his PCP. 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 I let him know that Purcell is not currently taking new patients, but he would like to know if an exception could be made. Requesting a call back at [phone]  He says if he will not accept him, he would like to know if there are any PCPs Dr. Purcell thinks would be a good match for him.

## 2023-12-01 ENCOUNTER — Ambulatory Visit: Admitting: Orthopedic Surgery

## 2023-12-01 ENCOUNTER — Encounter: Payer: Self-pay | Admitting: Orthopedic Surgery

## 2023-12-01 DIAGNOSIS — M75112 Incomplete rotator cuff tear or rupture of left shoulder, not specified as traumatic: Secondary | ICD-10-CM | POA: Diagnosis not present

## 2023-12-01 DIAGNOSIS — M7542 Impingement syndrome of left shoulder: Secondary | ICD-10-CM | POA: Diagnosis not present

## 2023-12-01 MED ORDER — METHYLPREDNISOLONE ACETATE 40 MG/ML IJ SUSP
40.0000 mg | Freq: Once | INTRAMUSCULAR | Status: AC
  Administered 2023-12-01: 40 mg via INTRA_ARTICULAR

## 2023-12-01 NOTE — Progress Notes (Addendum)
 FOLLOW-UP OFFICE VISIT   Patient: Duane [name]           Date of Birth: Nov 11, 1960           MRN: [id number] Visit Date: 12/01/2023 Requested by: Bertell Satterfield, MD [address] [place],  [place] [address] PCP: Bertell Satterfield, MD    Encounter Diagnoses  Name Primary?   Impingement syndrome of left shoulder    Nontraumatic incomplete tear of left rotator cuff Yes    Chief Complaint  Patient presents with   Results    Review MRI     63 year old male diagnosed with left impingement syndrome of the shoulder but continued to have pain we did give him an injection in the subacromial space on October 1 it did help for about 2 months.    He would like to discuss whether or not he can have an injection  That will require reexamination and MRI evaluation and review  I did review the MRI  He had an MRI done on October 14 the MRI shows a partial thickness rotator cuff tear  We went over this with him.  I told him that he should try rehab.  The surgery to complete the tear and then rerepair and expect healing in a diabetic who 63 years old is somewhat concerning  We decided to repeat his injection for now and repeat assessment in 3 months  Procedure note the subacromial injection shoulder left   Verbal consent was obtained to inject the  Left   Shoulder  Timeout was completed to confirm the injection site is a subacromial space of the  left  shoulder  Medication used Depo-Medrol  40 mg and lidocaine 1% 3 cc  Anesthesia was provided by ethyl chloride  The injection was performed in the left  posterior subacromial space. After pinning the skin with alcohol and anesthetized the skin with ethyl chloride the subacromial space was injected using a 20-gauge needle. There were no complications  Sterile dressing was applied.

## 2023-12-01 NOTE — Progress Notes (Signed)
    12/01/2023   Chief Complaint  Patient presents with   Shoulder Pain    Left wants inject   Results    Review MRI     No diagnosis found.  What pharmacy do you use ? _____[facility]______  DOI/DOS/ Date:    Did you get better, worse or no change (Answer below)   Unchanged

## 2024-03-05 ENCOUNTER — Ambulatory Visit: Admitting: Orthopedic Surgery
